# Patient Record
Sex: Male | Born: 1935 | Race: White | Hispanic: No | Smoking: Former smoker
Health system: Southern US, Community
[De-identification: ages and names within clinical notes are randomized; demographics above are authoritative.]

## PROBLEM LIST (undated history)

## (undated) DIAGNOSIS — K635 Polyp of colon: Secondary | ICD-10-CM

## (undated) DIAGNOSIS — C801 Malignant (primary) neoplasm, unspecified: Secondary | ICD-10-CM

## (undated) DIAGNOSIS — E785 Hyperlipidemia, unspecified: Secondary | ICD-10-CM

## (undated) DIAGNOSIS — R000 Tachycardia, unspecified: Secondary | ICD-10-CM

## (undated) DIAGNOSIS — J449 Chronic obstructive pulmonary disease, unspecified: Secondary | ICD-10-CM

## (undated) DIAGNOSIS — G309 Alzheimer's disease, unspecified: Secondary | ICD-10-CM

## (undated) DIAGNOSIS — I10 Essential (primary) hypertension: Secondary | ICD-10-CM

## (undated) DIAGNOSIS — F0280 Dementia in other diseases classified elsewhere without behavioral disturbance: Secondary | ICD-10-CM

## (undated) DIAGNOSIS — D689 Coagulation defect, unspecified: Secondary | ICD-10-CM

## (undated) HISTORY — DX: Essential (primary) hypertension: I10

## (undated) HISTORY — DX: Hyperlipidemia, unspecified: E78.5

## (undated) HISTORY — DX: Polyp of colon: K63.5

## (undated) HISTORY — DX: Alzheimer's disease, unspecified: G30.9

## (undated) HISTORY — DX: Dementia in other diseases classified elsewhere without behavioral disturbance: F02.80

## (undated) HISTORY — DX: Chronic obstructive pulmonary disease, unspecified: J44.9

## (undated) HISTORY — DX: Malignant (primary) neoplasm, unspecified: C80.1

## (undated) HISTORY — DX: Coagulation defect, unspecified: D68.9

---

## 2006-11-27 ENCOUNTER — Ambulatory Visit: Payer: Self-pay | Admitting: Gastroenterology

## 2006-12-04 ENCOUNTER — Ambulatory Visit: Payer: Self-pay | Admitting: Gastroenterology

## 2009-07-13 ENCOUNTER — Emergency Department: Payer: Self-pay | Admitting: Emergency Medicine

## 2009-10-27 ENCOUNTER — Ambulatory Visit: Payer: Self-pay | Admitting: Unknown Physician Specialty

## 2009-11-09 DIAGNOSIS — C4440 Unspecified malignant neoplasm of skin of scalp and neck: Secondary | ICD-10-CM | POA: Insufficient documentation

## 2009-12-07 HISTORY — PX: MOHS SURGERY: SHX181

## 2009-12-09 DIAGNOSIS — E785 Hyperlipidemia, unspecified: Secondary | ICD-10-CM | POA: Insufficient documentation

## 2009-12-09 DIAGNOSIS — F0280 Dementia in other diseases classified elsewhere without behavioral disturbance: Secondary | ICD-10-CM | POA: Insufficient documentation

## 2009-12-09 DIAGNOSIS — I10 Essential (primary) hypertension: Secondary | ICD-10-CM | POA: Insufficient documentation

## 2009-12-09 HISTORY — DX: Dementia in other diseases classified elsewhere, unspecified severity, without behavioral disturbance, psychotic disturbance, mood disturbance, and anxiety: F02.80

## 2011-08-25 DIAGNOSIS — H4010X0 Unspecified open-angle glaucoma, stage unspecified: Secondary | ICD-10-CM | POA: Diagnosis not present

## 2011-09-21 DIAGNOSIS — D696 Thrombocytopenia, unspecified: Secondary | ICD-10-CM | POA: Diagnosis not present

## 2011-09-21 DIAGNOSIS — I10 Essential (primary) hypertension: Secondary | ICD-10-CM | POA: Diagnosis not present

## 2011-09-21 DIAGNOSIS — F0280 Dementia in other diseases classified elsewhere without behavioral disturbance: Secondary | ICD-10-CM | POA: Diagnosis not present

## 2011-09-21 DIAGNOSIS — E785 Hyperlipidemia, unspecified: Secondary | ICD-10-CM | POA: Diagnosis not present

## 2011-09-22 DIAGNOSIS — F0280 Dementia in other diseases classified elsewhere without behavioral disturbance: Secondary | ICD-10-CM | POA: Diagnosis not present

## 2011-09-22 DIAGNOSIS — F068 Other specified mental disorders due to known physiological condition: Secondary | ICD-10-CM | POA: Diagnosis not present

## 2011-09-22 DIAGNOSIS — E785 Hyperlipidemia, unspecified: Secondary | ICD-10-CM | POA: Diagnosis not present

## 2011-09-22 DIAGNOSIS — G309 Alzheimer's disease, unspecified: Secondary | ICD-10-CM | POA: Diagnosis not present

## 2011-09-22 DIAGNOSIS — F0390 Unspecified dementia without behavioral disturbance: Secondary | ICD-10-CM | POA: Diagnosis not present

## 2011-09-22 DIAGNOSIS — D696 Thrombocytopenia, unspecified: Secondary | ICD-10-CM | POA: Diagnosis not present

## 2011-09-22 DIAGNOSIS — I10 Essential (primary) hypertension: Secondary | ICD-10-CM | POA: Diagnosis not present

## 2011-11-10 DIAGNOSIS — G309 Alzheimer's disease, unspecified: Secondary | ICD-10-CM | POA: Diagnosis not present

## 2011-12-23 DIAGNOSIS — B351 Tinea unguium: Secondary | ICD-10-CM | POA: Diagnosis not present

## 2011-12-23 DIAGNOSIS — M79609 Pain in unspecified limb: Secondary | ICD-10-CM | POA: Diagnosis not present

## 2012-01-03 DIAGNOSIS — Z85820 Personal history of malignant melanoma of skin: Secondary | ICD-10-CM | POA: Diagnosis not present

## 2012-01-03 DIAGNOSIS — D696 Thrombocytopenia, unspecified: Secondary | ICD-10-CM | POA: Diagnosis not present

## 2012-01-03 DIAGNOSIS — I10 Essential (primary) hypertension: Secondary | ICD-10-CM | POA: Diagnosis not present

## 2012-01-03 DIAGNOSIS — L989 Disorder of the skin and subcutaneous tissue, unspecified: Secondary | ICD-10-CM | POA: Diagnosis not present

## 2012-01-12 DIAGNOSIS — F0280 Dementia in other diseases classified elsewhere without behavioral disturbance: Secondary | ICD-10-CM | POA: Diagnosis not present

## 2012-01-27 DIAGNOSIS — R32 Unspecified urinary incontinence: Secondary | ICD-10-CM | POA: Diagnosis not present

## 2012-01-30 DIAGNOSIS — Z85820 Personal history of malignant melanoma of skin: Secondary | ICD-10-CM | POA: Diagnosis not present

## 2012-01-30 DIAGNOSIS — D696 Thrombocytopenia, unspecified: Secondary | ICD-10-CM | POA: Diagnosis not present

## 2012-01-30 DIAGNOSIS — G309 Alzheimer's disease, unspecified: Secondary | ICD-10-CM | POA: Diagnosis not present

## 2012-01-30 DIAGNOSIS — I10 Essential (primary) hypertension: Secondary | ICD-10-CM | POA: Diagnosis not present

## 2012-01-31 DIAGNOSIS — L905 Scar conditions and fibrosis of skin: Secondary | ICD-10-CM | POA: Diagnosis not present

## 2012-01-31 DIAGNOSIS — C44319 Basal cell carcinoma of skin of other parts of face: Secondary | ICD-10-CM | POA: Diagnosis not present

## 2012-01-31 DIAGNOSIS — L708 Other acne: Secondary | ICD-10-CM | POA: Diagnosis not present

## 2012-01-31 DIAGNOSIS — L570 Actinic keratosis: Secondary | ICD-10-CM | POA: Diagnosis not present

## 2012-02-01 DIAGNOSIS — F0280 Dementia in other diseases classified elsewhere without behavioral disturbance: Secondary | ICD-10-CM | POA: Diagnosis not present

## 2012-02-01 DIAGNOSIS — G309 Alzheimer's disease, unspecified: Secondary | ICD-10-CM | POA: Diagnosis not present

## 2012-02-01 DIAGNOSIS — F0281 Dementia in other diseases classified elsewhere with behavioral disturbance: Secondary | ICD-10-CM | POA: Diagnosis not present

## 2012-02-05 DIAGNOSIS — Z483 Aftercare following surgery for neoplasm: Secondary | ICD-10-CM | POA: Diagnosis not present

## 2012-02-05 DIAGNOSIS — F0390 Unspecified dementia without behavioral disturbance: Secondary | ICD-10-CM | POA: Diagnosis not present

## 2012-02-05 DIAGNOSIS — E785 Hyperlipidemia, unspecified: Secondary | ICD-10-CM | POA: Diagnosis not present

## 2012-02-05 DIAGNOSIS — J449 Chronic obstructive pulmonary disease, unspecified: Secondary | ICD-10-CM | POA: Diagnosis not present

## 2012-02-05 DIAGNOSIS — I10 Essential (primary) hypertension: Secondary | ICD-10-CM | POA: Diagnosis not present

## 2012-02-06 DIAGNOSIS — F0280 Dementia in other diseases classified elsewhere without behavioral disturbance: Secondary | ICD-10-CM | POA: Diagnosis not present

## 2012-02-06 DIAGNOSIS — I10 Essential (primary) hypertension: Secondary | ICD-10-CM | POA: Diagnosis not present

## 2012-02-06 DIAGNOSIS — D696 Thrombocytopenia, unspecified: Secondary | ICD-10-CM | POA: Diagnosis not present

## 2012-02-06 DIAGNOSIS — H409 Unspecified glaucoma: Secondary | ICD-10-CM | POA: Diagnosis not present

## 2012-02-08 DIAGNOSIS — Z483 Aftercare following surgery for neoplasm: Secondary | ICD-10-CM | POA: Diagnosis not present

## 2012-02-08 DIAGNOSIS — I10 Essential (primary) hypertension: Secondary | ICD-10-CM | POA: Diagnosis not present

## 2012-02-08 DIAGNOSIS — E785 Hyperlipidemia, unspecified: Secondary | ICD-10-CM | POA: Diagnosis not present

## 2012-02-08 DIAGNOSIS — F0390 Unspecified dementia without behavioral disturbance: Secondary | ICD-10-CM | POA: Diagnosis not present

## 2012-02-08 DIAGNOSIS — J449 Chronic obstructive pulmonary disease, unspecified: Secondary | ICD-10-CM | POA: Diagnosis not present

## 2012-02-09 DIAGNOSIS — Z483 Aftercare following surgery for neoplasm: Secondary | ICD-10-CM | POA: Diagnosis not present

## 2012-02-09 DIAGNOSIS — J449 Chronic obstructive pulmonary disease, unspecified: Secondary | ICD-10-CM | POA: Diagnosis not present

## 2012-02-09 DIAGNOSIS — E785 Hyperlipidemia, unspecified: Secondary | ICD-10-CM | POA: Diagnosis not present

## 2012-02-09 DIAGNOSIS — I10 Essential (primary) hypertension: Secondary | ICD-10-CM | POA: Diagnosis not present

## 2012-02-09 DIAGNOSIS — F0390 Unspecified dementia without behavioral disturbance: Secondary | ICD-10-CM | POA: Diagnosis not present

## 2012-02-10 ENCOUNTER — Ambulatory Visit: Payer: Self-pay | Admitting: Family Medicine

## 2012-02-10 DIAGNOSIS — I10 Essential (primary) hypertension: Secondary | ICD-10-CM | POA: Diagnosis not present

## 2012-02-10 DIAGNOSIS — R0989 Other specified symptoms and signs involving the circulatory and respiratory systems: Secondary | ICD-10-CM | POA: Diagnosis not present

## 2012-02-10 DIAGNOSIS — R0609 Other forms of dyspnea: Secondary | ICD-10-CM | POA: Diagnosis not present

## 2012-02-10 DIAGNOSIS — D696 Thrombocytopenia, unspecified: Secondary | ICD-10-CM | POA: Diagnosis not present

## 2012-02-10 DIAGNOSIS — H409 Unspecified glaucoma: Secondary | ICD-10-CM | POA: Diagnosis not present

## 2012-02-16 DIAGNOSIS — E785 Hyperlipidemia, unspecified: Secondary | ICD-10-CM | POA: Diagnosis not present

## 2012-02-16 DIAGNOSIS — J449 Chronic obstructive pulmonary disease, unspecified: Secondary | ICD-10-CM | POA: Diagnosis not present

## 2012-02-16 DIAGNOSIS — Z483 Aftercare following surgery for neoplasm: Secondary | ICD-10-CM | POA: Diagnosis not present

## 2012-02-16 DIAGNOSIS — I10 Essential (primary) hypertension: Secondary | ICD-10-CM | POA: Diagnosis not present

## 2012-02-16 DIAGNOSIS — F0390 Unspecified dementia without behavioral disturbance: Secondary | ICD-10-CM | POA: Diagnosis not present

## 2012-02-20 DIAGNOSIS — E785 Hyperlipidemia, unspecified: Secondary | ICD-10-CM | POA: Diagnosis not present

## 2012-02-20 DIAGNOSIS — F0280 Dementia in other diseases classified elsewhere without behavioral disturbance: Secondary | ICD-10-CM | POA: Diagnosis not present

## 2012-02-20 DIAGNOSIS — I10 Essential (primary) hypertension: Secondary | ICD-10-CM | POA: Diagnosis not present

## 2012-02-20 DIAGNOSIS — Z111 Encounter for screening for respiratory tuberculosis: Secondary | ICD-10-CM | POA: Diagnosis not present

## 2012-02-20 DIAGNOSIS — D696 Thrombocytopenia, unspecified: Secondary | ICD-10-CM | POA: Diagnosis not present

## 2012-02-20 DIAGNOSIS — G309 Alzheimer's disease, unspecified: Secondary | ICD-10-CM | POA: Diagnosis not present

## 2012-02-20 DIAGNOSIS — H4011X0 Primary open-angle glaucoma, stage unspecified: Secondary | ICD-10-CM | POA: Diagnosis not present

## 2012-02-22 DIAGNOSIS — Z483 Aftercare following surgery for neoplasm: Secondary | ICD-10-CM | POA: Diagnosis not present

## 2012-02-22 DIAGNOSIS — E785 Hyperlipidemia, unspecified: Secondary | ICD-10-CM | POA: Diagnosis not present

## 2012-02-22 DIAGNOSIS — J449 Chronic obstructive pulmonary disease, unspecified: Secondary | ICD-10-CM | POA: Diagnosis not present

## 2012-02-22 DIAGNOSIS — F0390 Unspecified dementia without behavioral disturbance: Secondary | ICD-10-CM | POA: Diagnosis not present

## 2012-02-22 DIAGNOSIS — I10 Essential (primary) hypertension: Secondary | ICD-10-CM | POA: Diagnosis not present

## 2012-03-05 DIAGNOSIS — L570 Actinic keratosis: Secondary | ICD-10-CM | POA: Diagnosis not present

## 2012-03-05 DIAGNOSIS — L821 Other seborrheic keratosis: Secondary | ICD-10-CM | POA: Diagnosis not present

## 2012-03-05 DIAGNOSIS — C44319 Basal cell carcinoma of skin of other parts of face: Secondary | ICD-10-CM | POA: Diagnosis not present

## 2012-04-02 DIAGNOSIS — C44319 Basal cell carcinoma of skin of other parts of face: Secondary | ICD-10-CM | POA: Diagnosis not present

## 2013-01-25 DIAGNOSIS — F0280 Dementia in other diseases classified elsewhere without behavioral disturbance: Secondary | ICD-10-CM | POA: Diagnosis not present

## 2013-02-11 DIAGNOSIS — I10 Essential (primary) hypertension: Secondary | ICD-10-CM | POA: Diagnosis not present

## 2013-02-11 DIAGNOSIS — E785 Hyperlipidemia, unspecified: Secondary | ICD-10-CM | POA: Diagnosis not present

## 2013-02-11 DIAGNOSIS — D696 Thrombocytopenia, unspecified: Secondary | ICD-10-CM | POA: Diagnosis not present

## 2013-02-11 LAB — CBC AND DIFFERENTIAL
HEMATOCRIT: 44 % (ref 41–53)
Hemoglobin: 15.1 g/dL (ref 13.5–17.5)
Platelets: 126 10*3/uL — AB (ref 150–399)
WBC: 7.3 10^3/mL

## 2013-02-12 DIAGNOSIS — D696 Thrombocytopenia, unspecified: Secondary | ICD-10-CM | POA: Diagnosis not present

## 2013-02-12 DIAGNOSIS — E785 Hyperlipidemia, unspecified: Secondary | ICD-10-CM | POA: Diagnosis not present

## 2013-02-12 DIAGNOSIS — Z0000 Encounter for general adult medical examination without abnormal findings: Secondary | ICD-10-CM | POA: Diagnosis not present

## 2013-02-12 DIAGNOSIS — Z23 Encounter for immunization: Secondary | ICD-10-CM | POA: Diagnosis not present

## 2013-02-12 DIAGNOSIS — F0280 Dementia in other diseases classified elsewhere without behavioral disturbance: Secondary | ICD-10-CM | POA: Diagnosis not present

## 2013-02-22 DIAGNOSIS — Z23 Encounter for immunization: Secondary | ICD-10-CM | POA: Diagnosis not present

## 2013-03-11 DIAGNOSIS — Z111 Encounter for screening for respiratory tuberculosis: Secondary | ICD-10-CM | POA: Diagnosis not present

## 2013-04-17 DIAGNOSIS — J019 Acute sinusitis, unspecified: Secondary | ICD-10-CM | POA: Diagnosis not present

## 2013-04-17 DIAGNOSIS — D696 Thrombocytopenia, unspecified: Secondary | ICD-10-CM | POA: Diagnosis not present

## 2013-04-17 DIAGNOSIS — F0280 Dementia in other diseases classified elsewhere without behavioral disturbance: Secondary | ICD-10-CM | POA: Diagnosis not present

## 2013-04-17 DIAGNOSIS — Z23 Encounter for immunization: Secondary | ICD-10-CM | POA: Diagnosis not present

## 2013-04-17 DIAGNOSIS — J449 Chronic obstructive pulmonary disease, unspecified: Secondary | ICD-10-CM | POA: Diagnosis not present

## 2013-07-12 ENCOUNTER — Inpatient Hospital Stay: Payer: Self-pay | Admitting: Internal Medicine

## 2013-07-12 DIAGNOSIS — J40 Bronchitis, not specified as acute or chronic: Secondary | ICD-10-CM | POA: Diagnosis not present

## 2013-07-12 DIAGNOSIS — G9340 Encephalopathy, unspecified: Secondary | ICD-10-CM | POA: Diagnosis present

## 2013-07-12 DIAGNOSIS — J449 Chronic obstructive pulmonary disease, unspecified: Secondary | ICD-10-CM | POA: Diagnosis not present

## 2013-07-12 DIAGNOSIS — I10 Essential (primary) hypertension: Secondary | ICD-10-CM | POA: Diagnosis present

## 2013-07-12 DIAGNOSIS — I959 Hypotension, unspecified: Secondary | ICD-10-CM | POA: Diagnosis not present

## 2013-07-12 DIAGNOSIS — H409 Unspecified glaucoma: Secondary | ICD-10-CM | POA: Diagnosis not present

## 2013-07-12 DIAGNOSIS — J9600 Acute respiratory failure, unspecified whether with hypoxia or hypercapnia: Secondary | ICD-10-CM | POA: Diagnosis present

## 2013-07-12 DIAGNOSIS — Z87891 Personal history of nicotine dependence: Secondary | ICD-10-CM | POA: Diagnosis not present

## 2013-07-12 DIAGNOSIS — J9819 Other pulmonary collapse: Secondary | ICD-10-CM | POA: Diagnosis not present

## 2013-07-12 DIAGNOSIS — J441 Chronic obstructive pulmonary disease with (acute) exacerbation: Secondary | ICD-10-CM | POA: Diagnosis not present

## 2013-07-12 DIAGNOSIS — G309 Alzheimer's disease, unspecified: Secondary | ICD-10-CM | POA: Diagnosis present

## 2013-07-12 DIAGNOSIS — Z85820 Personal history of malignant melanoma of skin: Secondary | ICD-10-CM | POA: Diagnosis not present

## 2013-07-12 DIAGNOSIS — E785 Hyperlipidemia, unspecified: Secondary | ICD-10-CM | POA: Diagnosis not present

## 2013-07-12 DIAGNOSIS — A419 Sepsis, unspecified organism: Secondary | ICD-10-CM | POA: Diagnosis present

## 2013-07-12 DIAGNOSIS — J189 Pneumonia, unspecified organism: Secondary | ICD-10-CM | POA: Diagnosis present

## 2013-07-12 DIAGNOSIS — F0280 Dementia in other diseases classified elsewhere without behavioral disturbance: Secondary | ICD-10-CM | POA: Diagnosis present

## 2013-07-12 DIAGNOSIS — I359 Nonrheumatic aortic valve disorder, unspecified: Secondary | ICD-10-CM | POA: Diagnosis not present

## 2013-07-12 DIAGNOSIS — F02818 Dementia in other diseases classified elsewhere, unspecified severity, with other behavioral disturbance: Secondary | ICD-10-CM | POA: Diagnosis present

## 2013-07-12 DIAGNOSIS — Z7982 Long term (current) use of aspirin: Secondary | ICD-10-CM | POA: Diagnosis not present

## 2013-07-12 DIAGNOSIS — F0281 Dementia in other diseases classified elsewhere with behavioral disturbance: Secondary | ICD-10-CM | POA: Diagnosis present

## 2013-07-12 DIAGNOSIS — R0602 Shortness of breath: Secondary | ICD-10-CM | POA: Diagnosis not present

## 2013-07-12 LAB — BASIC METABOLIC PANEL
Anion Gap: 8 (ref 7–16)
BUN: 17 mg/dL (ref 7–18)
CALCIUM: 8.7 mg/dL (ref 8.5–10.1)
CHLORIDE: 106 mmol/L (ref 98–107)
CO2: 25 mmol/L (ref 21–32)
CREATININE: 1.07 mg/dL (ref 0.60–1.30)
EGFR (Non-African Amer.): >60
Glucose: 179 mg/dL — ABNORMAL HIGH (ref 65–99)
OSMOLALITY: 284 (ref 275–301)
Potassium: 3.6 mmol/L (ref 3.5–5.1)
Sodium: 139 mmol/L (ref 136–145)

## 2013-07-12 LAB — CBC
HCT: 47.6 % (ref 40.0–52.0)
HGB: 15.4 g/dL (ref 13.0–18.0)
MCH: 31.3 pg (ref 26.0–34.0)
MCHC: 32.4 g/dL (ref 32.0–36.0)
MCV: 97 fL (ref 80–100)
PLATELETS: 112 10*3/uL — AB (ref 150–440)
RBC: 4.93 10*6/uL (ref 4.40–5.90)
RDW: 14.3 % (ref 11.5–14.5)
WBC: 13.7 10*3/uL — AB (ref 3.8–10.6)

## 2013-07-12 LAB — PRO B NATRIURETIC PEPTIDE: B-Type Natriuretic Peptide: 174 pg/mL (ref 0–450)

## 2013-07-12 LAB — CK TOTAL AND CKMB (NOT AT ARMC)
CK, Total: 102 U/L
CK-MB: 2.4 ng/mL (ref 0.5–3.6)

## 2013-07-12 LAB — TROPONIN I: Troponin-I: 0.03 ng/mL

## 2013-07-13 DIAGNOSIS — J40 Bronchitis, not specified as acute or chronic: Secondary | ICD-10-CM | POA: Diagnosis not present

## 2013-07-13 DIAGNOSIS — J9819 Other pulmonary collapse: Secondary | ICD-10-CM | POA: Diagnosis not present

## 2013-07-13 DIAGNOSIS — E785 Hyperlipidemia, unspecified: Secondary | ICD-10-CM | POA: Diagnosis not present

## 2013-07-13 DIAGNOSIS — J9600 Acute respiratory failure, unspecified whether with hypoxia or hypercapnia: Secondary | ICD-10-CM | POA: Diagnosis not present

## 2013-07-13 DIAGNOSIS — I959 Hypotension, unspecified: Secondary | ICD-10-CM | POA: Diagnosis not present

## 2013-07-13 DIAGNOSIS — H409 Unspecified glaucoma: Secondary | ICD-10-CM | POA: Diagnosis not present

## 2013-07-13 DIAGNOSIS — J441 Chronic obstructive pulmonary disease with (acute) exacerbation: Secondary | ICD-10-CM | POA: Diagnosis not present

## 2013-07-13 LAB — BASIC METABOLIC PANEL
Anion Gap: 8 (ref 7–16)
BUN: 16 mg/dL (ref 7–18)
CHLORIDE: 107 mmol/L (ref 98–107)
CREATININE: 1.16 mg/dL (ref 0.60–1.30)
Calcium, Total: 8.5 mg/dL (ref 8.5–10.1)
Co2: 23 mmol/L (ref 21–32)
EGFR (African American): >60
EGFR (Non-African Amer.): 58 — ABNORMAL LOW
GLUCOSE: 225 mg/dL — AB (ref 65–99)
Osmolality: 284 (ref 275–301)
Potassium: 4.1 mmol/L (ref 3.5–5.1)
Sodium: 138 mmol/L (ref 136–145)

## 2013-07-13 LAB — CBC WITH DIFFERENTIAL/PLATELET
BASOS PCT: 0.1 %
Basophil #: 0 10*3/uL (ref 0.0–0.1)
Eosinophil #: 0 10*3/uL (ref 0.0–0.7)
Eosinophil %: 0 %
HCT: 42.5 % (ref 40.0–52.0)
HGB: 14 g/dL (ref 13.0–18.0)
Lymphocyte #: 0.7 10*3/uL — ABNORMAL LOW (ref 1.0–3.6)
Lymphocyte %: 4 %
MCH: 31.5 pg (ref 26.0–34.0)
MCHC: 32.9 g/dL (ref 32.0–36.0)
MCV: 96 fL (ref 80–100)
MONOS PCT: 3.8 %
Monocyte #: 0.7 x10 3/mm (ref 0.2–1.0)
Neutrophil #: 16.4 10*3/uL — ABNORMAL HIGH (ref 1.4–6.5)
Neutrophil %: 92.1 %
PLATELETS: 94 10*3/uL — AB (ref 150–440)
RBC: 4.43 10*6/uL (ref 4.40–5.90)
RDW: 14.1 % (ref 11.5–14.5)
WBC: 17.8 10*3/uL — ABNORMAL HIGH (ref 3.8–10.6)

## 2013-07-14 DIAGNOSIS — J9600 Acute respiratory failure, unspecified whether with hypoxia or hypercapnia: Secondary | ICD-10-CM | POA: Diagnosis not present

## 2013-07-14 DIAGNOSIS — H409 Unspecified glaucoma: Secondary | ICD-10-CM | POA: Diagnosis not present

## 2013-07-14 DIAGNOSIS — E785 Hyperlipidemia, unspecified: Secondary | ICD-10-CM | POA: Diagnosis not present

## 2013-07-14 DIAGNOSIS — J441 Chronic obstructive pulmonary disease with (acute) exacerbation: Secondary | ICD-10-CM | POA: Diagnosis not present

## 2013-07-14 DIAGNOSIS — I959 Hypotension, unspecified: Secondary | ICD-10-CM | POA: Diagnosis not present

## 2013-07-14 LAB — CBC WITH DIFFERENTIAL/PLATELET
BASOS ABS: 0 10*3/uL (ref 0.0–0.1)
BASOS PCT: 0 %
Eosinophil #: 0 10*3/uL (ref 0.0–0.7)
Eosinophil %: 0 %
HCT: 39 % — ABNORMAL LOW (ref 40.0–52.0)
HGB: 12.7 g/dL — ABNORMAL LOW (ref 13.0–18.0)
LYMPHS ABS: 1 10*3/uL (ref 1.0–3.6)
LYMPHS PCT: 6.1 %
MCH: 31 pg (ref 26.0–34.0)
MCHC: 32.4 g/dL (ref 32.0–36.0)
MCV: 96 fL (ref 80–100)
MONO ABS: 0.9 x10 3/mm (ref 0.2–1.0)
MONOS PCT: 5.5 %
NEUTROS PCT: 88.4 %
Neutrophil #: 13.8 10*3/uL — ABNORMAL HIGH (ref 1.4–6.5)
PLATELETS: 100 10*3/uL — AB (ref 150–440)
RBC: 4.09 10*6/uL — AB (ref 4.40–5.90)
RDW: 14.1 % (ref 11.5–14.5)
WBC: 15.6 10*3/uL — ABNORMAL HIGH (ref 3.8–10.6)

## 2013-07-15 DIAGNOSIS — J9600 Acute respiratory failure, unspecified whether with hypoxia or hypercapnia: Secondary | ICD-10-CM | POA: Diagnosis not present

## 2013-07-15 DIAGNOSIS — I359 Nonrheumatic aortic valve disorder, unspecified: Secondary | ICD-10-CM | POA: Diagnosis not present

## 2013-07-15 DIAGNOSIS — I959 Hypotension, unspecified: Secondary | ICD-10-CM | POA: Diagnosis not present

## 2013-07-15 DIAGNOSIS — J441 Chronic obstructive pulmonary disease with (acute) exacerbation: Secondary | ICD-10-CM | POA: Diagnosis not present

## 2013-07-15 DIAGNOSIS — E785 Hyperlipidemia, unspecified: Secondary | ICD-10-CM | POA: Diagnosis not present

## 2013-07-15 DIAGNOSIS — H409 Unspecified glaucoma: Secondary | ICD-10-CM | POA: Diagnosis not present

## 2013-07-16 DIAGNOSIS — H409 Unspecified glaucoma: Secondary | ICD-10-CM | POA: Diagnosis not present

## 2013-07-16 DIAGNOSIS — E785 Hyperlipidemia, unspecified: Secondary | ICD-10-CM | POA: Diagnosis not present

## 2013-07-16 DIAGNOSIS — J9600 Acute respiratory failure, unspecified whether with hypoxia or hypercapnia: Secondary | ICD-10-CM | POA: Diagnosis not present

## 2013-07-16 DIAGNOSIS — J441 Chronic obstructive pulmonary disease with (acute) exacerbation: Secondary | ICD-10-CM | POA: Diagnosis not present

## 2013-07-16 DIAGNOSIS — I959 Hypotension, unspecified: Secondary | ICD-10-CM | POA: Diagnosis not present

## 2013-07-17 LAB — CULTURE, BLOOD (SINGLE)

## 2013-07-31 DIAGNOSIS — G309 Alzheimer's disease, unspecified: Secondary | ICD-10-CM | POA: Diagnosis not present

## 2013-07-31 DIAGNOSIS — Z23 Encounter for immunization: Secondary | ICD-10-CM | POA: Diagnosis not present

## 2013-07-31 DIAGNOSIS — D696 Thrombocytopenia, unspecified: Secondary | ICD-10-CM | POA: Diagnosis not present

## 2013-07-31 DIAGNOSIS — J441 Chronic obstructive pulmonary disease with (acute) exacerbation: Secondary | ICD-10-CM | POA: Diagnosis not present

## 2013-07-31 DIAGNOSIS — J449 Chronic obstructive pulmonary disease, unspecified: Secondary | ICD-10-CM | POA: Diagnosis not present

## 2013-07-31 DIAGNOSIS — F0280 Dementia in other diseases classified elsewhere without behavioral disturbance: Secondary | ICD-10-CM | POA: Diagnosis not present

## 2013-08-07 DIAGNOSIS — R262 Difficulty in walking, not elsewhere classified: Secondary | ICD-10-CM | POA: Diagnosis not present

## 2013-08-07 DIAGNOSIS — R0602 Shortness of breath: Secondary | ICD-10-CM | POA: Diagnosis not present

## 2013-08-07 DIAGNOSIS — R279 Unspecified lack of coordination: Secondary | ICD-10-CM | POA: Diagnosis not present

## 2013-08-07 DIAGNOSIS — R069 Unspecified abnormalities of breathing: Secondary | ICD-10-CM | POA: Diagnosis not present

## 2013-08-12 DIAGNOSIS — R069 Unspecified abnormalities of breathing: Secondary | ICD-10-CM | POA: Diagnosis not present

## 2013-08-12 DIAGNOSIS — R0602 Shortness of breath: Secondary | ICD-10-CM | POA: Diagnosis not present

## 2013-08-14 DIAGNOSIS — R279 Unspecified lack of coordination: Secondary | ICD-10-CM | POA: Diagnosis not present

## 2013-08-14 DIAGNOSIS — R0602 Shortness of breath: Secondary | ICD-10-CM | POA: Diagnosis not present

## 2013-08-14 DIAGNOSIS — R069 Unspecified abnormalities of breathing: Secondary | ICD-10-CM | POA: Diagnosis not present

## 2013-08-14 DIAGNOSIS — R262 Difficulty in walking, not elsewhere classified: Secondary | ICD-10-CM | POA: Diagnosis not present

## 2013-10-01 DIAGNOSIS — Z23 Encounter for immunization: Secondary | ICD-10-CM | POA: Diagnosis not present

## 2013-10-01 DIAGNOSIS — D696 Thrombocytopenia, unspecified: Secondary | ICD-10-CM | POA: Diagnosis not present

## 2013-10-01 DIAGNOSIS — B356 Tinea cruris: Secondary | ICD-10-CM | POA: Diagnosis not present

## 2013-10-01 DIAGNOSIS — G309 Alzheimer's disease, unspecified: Secondary | ICD-10-CM | POA: Diagnosis not present

## 2013-10-01 DIAGNOSIS — F0280 Dementia in other diseases classified elsewhere without behavioral disturbance: Secondary | ICD-10-CM | POA: Diagnosis not present

## 2013-10-01 DIAGNOSIS — L0291 Cutaneous abscess, unspecified: Secondary | ICD-10-CM | POA: Diagnosis not present

## 2013-10-01 DIAGNOSIS — L0390 Cellulitis, unspecified: Secondary | ICD-10-CM | POA: Diagnosis not present

## 2013-10-16 DIAGNOSIS — F0280 Dementia in other diseases classified elsewhere without behavioral disturbance: Secondary | ICD-10-CM | POA: Diagnosis not present

## 2013-10-16 DIAGNOSIS — Z23 Encounter for immunization: Secondary | ICD-10-CM | POA: Diagnosis not present

## 2013-10-16 DIAGNOSIS — L0291 Cutaneous abscess, unspecified: Secondary | ICD-10-CM | POA: Diagnosis not present

## 2013-10-16 DIAGNOSIS — L0390 Cellulitis, unspecified: Secondary | ICD-10-CM | POA: Diagnosis not present

## 2013-10-16 DIAGNOSIS — G309 Alzheimer's disease, unspecified: Secondary | ICD-10-CM | POA: Diagnosis not present

## 2013-10-16 DIAGNOSIS — B356 Tinea cruris: Secondary | ICD-10-CM | POA: Diagnosis not present

## 2013-10-16 DIAGNOSIS — D696 Thrombocytopenia, unspecified: Secondary | ICD-10-CM | POA: Diagnosis not present

## 2013-12-23 ENCOUNTER — Inpatient Hospital Stay: Payer: Self-pay | Admitting: Internal Medicine

## 2013-12-23 DIAGNOSIS — H409 Unspecified glaucoma: Secondary | ICD-10-CM | POA: Diagnosis present

## 2013-12-23 DIAGNOSIS — G309 Alzheimer's disease, unspecified: Secondary | ICD-10-CM | POA: Diagnosis present

## 2013-12-23 DIAGNOSIS — Z66 Do not resuscitate: Secondary | ICD-10-CM | POA: Diagnosis present

## 2013-12-23 DIAGNOSIS — Z7982 Long term (current) use of aspirin: Secondary | ICD-10-CM | POA: Diagnosis not present

## 2013-12-23 DIAGNOSIS — B9689 Other specified bacterial agents as the cause of diseases classified elsewhere: Secondary | ICD-10-CM | POA: Diagnosis present

## 2013-12-23 DIAGNOSIS — D72829 Elevated white blood cell count, unspecified: Secondary | ICD-10-CM | POA: Diagnosis not present

## 2013-12-23 DIAGNOSIS — J440 Chronic obstructive pulmonary disease with acute lower respiratory infection: Secondary | ICD-10-CM | POA: Diagnosis present

## 2013-12-23 DIAGNOSIS — J40 Bronchitis, not specified as acute or chronic: Secondary | ICD-10-CM | POA: Diagnosis not present

## 2013-12-23 DIAGNOSIS — A499 Bacterial infection, unspecified: Secondary | ICD-10-CM | POA: Diagnosis present

## 2013-12-23 DIAGNOSIS — Z87891 Personal history of nicotine dependence: Secondary | ICD-10-CM | POA: Diagnosis not present

## 2013-12-23 DIAGNOSIS — E785 Hyperlipidemia, unspecified: Secondary | ICD-10-CM | POA: Diagnosis present

## 2013-12-23 DIAGNOSIS — F0280 Dementia in other diseases classified elsewhere without behavioral disturbance: Secondary | ICD-10-CM | POA: Diagnosis not present

## 2013-12-23 DIAGNOSIS — J189 Pneumonia, unspecified organism: Secondary | ICD-10-CM | POA: Diagnosis not present

## 2013-12-23 DIAGNOSIS — R509 Fever, unspecified: Secondary | ICD-10-CM | POA: Diagnosis not present

## 2013-12-23 DIAGNOSIS — R0902 Hypoxemia: Secondary | ICD-10-CM | POA: Diagnosis not present

## 2013-12-23 DIAGNOSIS — F0390 Unspecified dementia without behavioral disturbance: Secondary | ICD-10-CM | POA: Diagnosis not present

## 2013-12-23 DIAGNOSIS — Z85820 Personal history of malignant melanoma of skin: Secondary | ICD-10-CM | POA: Diagnosis not present

## 2013-12-23 DIAGNOSIS — I10 Essential (primary) hypertension: Secondary | ICD-10-CM | POA: Diagnosis present

## 2013-12-23 DIAGNOSIS — A419 Sepsis, unspecified organism: Secondary | ICD-10-CM | POA: Diagnosis not present

## 2013-12-23 LAB — CBC WITH DIFFERENTIAL/PLATELET
BASOS PCT: 0.2 %
Basophil #: 0 10*3/uL (ref 0.0–0.1)
Eosinophil #: 0.1 10*3/uL (ref 0.0–0.7)
Eosinophil %: 0.2 %
HCT: 42.8 % (ref 40.0–52.0)
HGB: 13.9 g/dL (ref 13.0–18.0)
Lymphocyte #: 1.2 10*3/uL (ref 1.0–3.6)
Lymphocyte %: 5.5 %
MCH: 30.8 pg (ref 26.0–34.0)
MCHC: 32.5 g/dL (ref 32.0–36.0)
MCV: 95 fL (ref 80–100)
MONOS PCT: 6.5 %
Monocyte #: 1.4 x10 3/mm — ABNORMAL HIGH (ref 0.2–1.0)
NEUTROS PCT: 87.6 %
Neutrophil #: 19 10*3/uL — ABNORMAL HIGH (ref 1.4–6.5)
Platelet: 165 10*3/uL (ref 150–440)
RBC: 4.52 10*6/uL (ref 4.40–5.90)
RDW: 14 % (ref 11.5–14.5)
WBC: 21.7 10*3/uL — AB (ref 3.8–10.6)

## 2013-12-23 LAB — URINALYSIS, COMPLETE
BILIRUBIN, UR: NEGATIVE
Bacteria: NONE SEEN
Glucose,UR: NEGATIVE mg/dL (ref 0–75)
Hyaline Cast: 3
KETONE: NEGATIVE
LEUKOCYTE ESTERASE: NEGATIVE
Nitrite: NEGATIVE
Ph: 5 (ref 4.5–8.0)
SQUAMOUS EPITHELIAL: NONE SEEN
Specific Gravity: 1.028 (ref 1.003–1.030)
WBC UR: NONE SEEN /HPF (ref 0–5)

## 2013-12-23 LAB — COMPREHENSIVE METABOLIC PANEL
AST: 58 U/L — AB (ref 15–37)
Albumin: 2.9 g/dL — ABNORMAL LOW (ref 3.4–5.0)
Alkaline Phosphatase: 142 U/L — ABNORMAL HIGH
Anion Gap: 9 (ref 7–16)
BUN: 17 mg/dL (ref 7–18)
Bilirubin,Total: 0.6 mg/dL (ref 0.2–1.0)
CHLORIDE: 106 mmol/L (ref 98–107)
Calcium, Total: 8.7 mg/dL (ref 8.5–10.1)
Co2: 25 mmol/L (ref 21–32)
Creatinine: 0.95 mg/dL (ref 0.60–1.30)
EGFR (African American): >60
EGFR (Non-African Amer.): >60
GLUCOSE: 145 mg/dL — AB (ref 65–99)
OSMOLALITY: 284 (ref 275–301)
Potassium: 4.6 mmol/L (ref 3.5–5.1)
SGPT (ALT): 47 U/L
SODIUM: 140 mmol/L (ref 136–145)
Total Protein: 7.6 g/dL (ref 6.4–8.2)

## 2013-12-23 LAB — PROTIME-INR
INR: 0.9
PROTHROMBIN TIME: 12.5 s (ref 11.5–14.7)

## 2013-12-23 LAB — MAGNESIUM: MAGNESIUM: 2.1 mg/dL

## 2013-12-23 LAB — TROPONIN I: Troponin-I: <0.02 ng/mL

## 2013-12-23 LAB — PHOSPHORUS: Phosphorus: 1.4 mg/dL — ABNORMAL LOW (ref 2.5–4.9)

## 2013-12-24 LAB — CBC WITH DIFFERENTIAL/PLATELET
Basophil #: 0 10*3/uL (ref 0.0–0.1)
Basophil %: 0.1 %
EOS ABS: 0 10*3/uL (ref 0.0–0.7)
Eosinophil %: 0 %
HCT: 34.4 % — ABNORMAL LOW (ref 40.0–52.0)
HGB: 11.1 g/dL — ABNORMAL LOW (ref 13.0–18.0)
LYMPHS ABS: 0.6 10*3/uL — AB (ref 1.0–3.6)
LYMPHS PCT: 3.1 %
MCH: 30.5 pg (ref 26.0–34.0)
MCHC: 32.2 g/dL (ref 32.0–36.0)
MCV: 95 fL (ref 80–100)
MONOS PCT: 2.3 %
Monocyte #: 0.4 x10 3/mm (ref 0.2–1.0)
NEUTROS ABS: 17 10*3/uL — AB (ref 1.4–6.5)
NEUTROS PCT: 94.5 %
Platelet: 135 10*3/uL — ABNORMAL LOW (ref 150–440)
RBC: 3.63 10*6/uL — ABNORMAL LOW (ref 4.40–5.90)
RDW: 14 % (ref 11.5–14.5)
WBC: 18 10*3/uL — ABNORMAL HIGH (ref 3.8–10.6)

## 2013-12-24 LAB — BASIC METABOLIC PANEL
ANION GAP: 8 (ref 7–16)
BUN: 12 mg/dL (ref 7–18)
CALCIUM: 7.7 mg/dL — AB (ref 8.5–10.1)
Chloride: 112 mmol/L — ABNORMAL HIGH (ref 98–107)
Co2: 23 mmol/L (ref 21–32)
Creatinine: 0.9 mg/dL (ref 0.60–1.30)
EGFR (African American): >60
EGFR (Non-African Amer.): >60
GLUCOSE: 216 mg/dL — AB (ref 65–99)
Osmolality: 291 (ref 275–301)
POTASSIUM: 4.1 mmol/L (ref 3.5–5.1)
Sodium: 143 mmol/L (ref 136–145)

## 2013-12-24 LAB — RAPID INFLUENZA A&B ANTIGENS (ARMC ONLY)

## 2013-12-25 LAB — WBC: WBC: 15.6 10*3/uL — ABNORMAL HIGH (ref 3.8–10.6)

## 2013-12-25 LAB — PLATELET COUNT: PLATELETS: 148 10*3/uL — AB (ref 150–440)

## 2013-12-25 LAB — URINE CULTURE

## 2013-12-28 LAB — CULTURE, BLOOD (SINGLE)

## 2014-01-15 DIAGNOSIS — J209 Acute bronchitis, unspecified: Secondary | ICD-10-CM | POA: Diagnosis not present

## 2014-01-15 DIAGNOSIS — Z23 Encounter for immunization: Secondary | ICD-10-CM | POA: Diagnosis not present

## 2014-02-07 DIAGNOSIS — G309 Alzheimer's disease, unspecified: Secondary | ICD-10-CM | POA: Diagnosis not present

## 2014-02-07 DIAGNOSIS — F0280 Dementia in other diseases classified elsewhere without behavioral disturbance: Secondary | ICD-10-CM | POA: Diagnosis not present

## 2014-02-07 DIAGNOSIS — M799 Soft tissue disorder, unspecified: Secondary | ICD-10-CM | POA: Diagnosis not present

## 2014-02-07 DIAGNOSIS — F02818 Dementia in other diseases classified elsewhere, unspecified severity, with other behavioral disturbance: Secondary | ICD-10-CM | POA: Diagnosis not present

## 2014-02-07 DIAGNOSIS — F0281 Dementia in other diseases classified elsewhere with behavioral disturbance: Secondary | ICD-10-CM | POA: Diagnosis not present

## 2014-05-30 DIAGNOSIS — I10 Essential (primary) hypertension: Secondary | ICD-10-CM | POA: Diagnosis not present

## 2014-05-30 DIAGNOSIS — E785 Hyperlipidemia, unspecified: Secondary | ICD-10-CM | POA: Diagnosis not present

## 2014-05-30 LAB — BASIC METABOLIC PANEL
BUN: 15 mg/dL (ref 4–21)
Creatinine: 0.7 mg/dL (ref 0.6–1.3)
GLUCOSE: 114 mg/dL
POTASSIUM: 4.1 mmol/L (ref 3.4–5.3)
SODIUM: 143 mmol/L (ref 137–147)

## 2014-05-30 LAB — HEPATIC FUNCTION PANEL
ALT: 12 U/L (ref 10–40)
AST: 13 U/L — AB (ref 14–40)

## 2014-05-30 LAB — LIPID PANEL
Cholesterol: 157 mg/dL (ref 0–200)
HDL: 48 mg/dL (ref 35–70)
LDL Cholesterol: 92 mg/dL
Triglycerides: 87 mg/dL (ref 40–160)

## 2014-07-08 DIAGNOSIS — Z0000 Encounter for general adult medical examination without abnormal findings: Secondary | ICD-10-CM | POA: Diagnosis not present

## 2014-09-06 NOTE — H&P (Signed)
PATIENT NAME:  Adam Singleton, Adam Singleton MR#:  [id number] DATE OF BIRTH:  [birth year]  DATE OF ADMISSION:  07/12/2013  PRIMARY CARE PHYSICIAN: Dr.  Lelon Huh.   REFERRING PHYSICIAN:  Dr. Lennette Bihari A. Paduchowski.   CHIEF COMPLAINT: Shortness of breath.   HISTORY OF PRESENT ILLNESS: This is an 81 year old male with past medical history of Alzheimer's dementia, hyperlipidemia, hypertension, glaucoma, history of malignant melanoma, COPD, who lives in [facility] home for last one year because of Alzheimer's. Today, they called his son about him having COPD exacerbation, severe shortness of breath, cough and fever, and called the EMS to send him to Emergency Room. History obtained from patient's son who is present currently in the room. His name is Mr. Adam Singleton, Brooke Bonito., who is also healthcare power of attorney for the patient. As per him, he got a call from the nursing home that he is short of breath for one day now and not getting better, so they decided to send him to Emergency Room. He denies any further history other than fever and cough. At patient's baseline, he does not use any oxygen. He has some inhalers, and the patient is able to walk without any support at baseline.   REVIEW OF SYSTEMS: Unable to get, as the patient is not able to give me any history.   HISTORY OF PRESENTING ILLNESS: Further finding;  in ER, the patient was found to be breathing at rate of 35 to 40, and so he was started on BiPAP with treatment of COPD and hospitalist service was contacted for further management.   PAST MEDICAL HISTORY:  1.  Hypertension.  2.  Hyperlipidemia.  3.  COPD.   4.  Glaucoma. 5.  Alzheimer's dementia. 6.  Malignant melanoma.   SOCIAL HISTORY: Lives in a nursing home, because of Alzheimer's, for the last one year. He was a smoker, but quit smoking a long time ago in 1990s.  No alcohol or illegal drug use.   FAMILY HISTORY: Positive for cancer of bone in his sister.   MEDICATIONS: At nursing  home currently; 1.  Aspirin 81 mg daily.  2.  Lisinopril 40 mg daily.  3.  Namenda extended-release 28 mg capsule once a day.  4.  Symbicort 2 puffs inhalation twice daily.  5.  Lovastatin 20 mg daily.  6.  Travatan 0.004% eyedrops at bedtime, both eyes.  7.  Aricept 10 mg oral tablet daily.  8.  ProAir inhalation 2 puffs inhaled every 6 hours as needed.  PHYSICAL EXAMINATION: VITAL SIGNS: In the ER, temperature 99.5, pulse rate 131, respiratory rate was 35, blood pressure 167/70 and pulse oximetry is 90 on room air; he is now 99% on BiPAP.  GENERAL: The patient appeared to be acutely distressed because of respiratory failure, using BiPAP and breathing at almost 35 to 40 constantly.  HEENT: Head and neck atraumatic. Conjunctivae pink. Oral mucosa moist.  NECK: Supple. No JVD.  RESPIRATORY: Bilateral wheezing and crackles present.  CARDIOVASCULAR: S1, S2 present. Tachycardia, no murmurs appreciated.  ABDOMEN:  Obese feels a little tight, bowel sounds present. No organomegaly felt.  SKIN: No rashes.  EXTREMITIES: Legs: No edema.  NEUROLOGICAL: He is able to move all four limbs. Power 4/5. No tremors.   LABORATORY AND RADIOLOGY: WBC count 13.7, hemoglobin 15.4, platelet count 112, MCV is 97. Troponin is 0.03, creatinine 1.07, sodium level 139, potassium 3.6, chloride is 106, and CO2 25, BNP is 174.  On ABG:  pH is 7.4, pCO2 31  and pO2 is 171, FiO2 is 50% on BiPAP. Chest x-ray, portable, single view, showed no acute infiltrate or pulmonary edema, stable right basilar atelectasis and scar.   ASSESSMENT AND PLAN: An 81 year old male with multiple past medical history; Alzheimer's, hypertension, hyperlipidemia, chronic obstructive pulmonary disease, nursing home resident presented with cough, fever and acute respiratory distress, started on BiPAP in ER because of increased respiratory rate.  1.  Acute respiratory failure. This is due to chronic obstructive pulmonary disease. We will treat it and  continue BiPAP for now.  Discussed with the patient's son, who is healthcare power of attorney also, about the patient's wishes for cardiopulmonary resuscitation and intubation and he wanted him to be full code in any adverse event. I explained to him about fatal condition and also informed that if the patient does not improve or has some worsening in his condition, he might need to get intubated overnight. He understands and agreed for that.  2.  Chronic obstructive pulmonary disease exacerbation. We gave IV Solu-Medrol, DuoNebs, Spiriva, Rocephin and azithromycin.  3.  Hyperlipidemia. We will continue lovastatin  4.  Glaucoma. We will continue Travatan drops.  5.  Hypertension. We will continue lisinopril.  6.  CODE STATUS: Full code.   CONDITION: Is critical due to acute respiratory failure and rate is up to 35 and using accessory muscles of respiration.   Total time spent in this admission: critical care 50 minutes.    ____________________________ Ceasar Lund Anselm Jungling, MD vgv:NTS D: 07/12/2013 22:27:42 ET T: 07/12/2013 22:50:57 ET JOB#: [id number]  cc: Ceasar Lund. Anselm Jungling, MD, <Dictator> Kirstie Peri. Caryn Section, MD  Vaughan Basta MD ELECTRONICALLY SIGNED 07/13/2013 0:00

## 2014-09-06 NOTE — Discharge Summary (Signed)
PATIENT NAME:  Adam Singleton, Adam Singleton MR#:  [id number] DATE OF BIRTH:  08/20/1928  DATE OF ADMISSION:  07/12/2013 DATE OF DISCHARGE:  07/16/2013  DISCHARGE DIAGNOSES: 1.  Acute respiratory failure.  2.  Right lower lobe pneumonia.  3.  Acute chronic obstructive pulmonary disease exacerbation.  4.  Acute encephalopathy.  5.  Dementia.  6.  Hypotension.   IMAGING STUDIES: Include a chest x-ray which showed early infiltrate in the right lower lobe.   DISCHARGE MEDICATIONS: 1.  Memantine 10 mg oral 2 times a day.  2.  Donepezil 10 mg oral once a day. 3.  Prednisone 30 mg tapered over 3 days.  4.  Levaquin 500 mg oral once a day.  5.  Aspirin 81 mg daily.  6.  Ativan 1 mg oral 2 times a day as needed for agitation.  7.  Combivent Respimat 1 puff inhaled 4 times a day as needed for shortness of breath.  8.  Lovastatin 20 mg oral once a day.  9.  Travoprost 0.004% ophthalmic solution 1 drop to each affected eye once a day at bedtime.   DISCHARGE INSTRUCTIONS: Regular diet of regular consistency. Activity as tolerated with assistance at all times. Follow up with physician at the rehab in 1 to 2 days.   ADMITTING HISTORY AND PHYSICAL AND HOSPITAL COURSE: Please see detailed H and P dictated previously by Dr. Anselm Jungling. In brief, an 81 year old Caucasian male patient with history of advanced Alzheimer dementia, hyperlipidemia, hypertension, and COPD who presented to the hospital complaining of shortness of breath from [facility] home. The patient is normally in memory care unit, locked down unit. The patient was placed on BiPAP in the Emergency Room, admitted to the hospitalist service.   HOSPITAL COURSE: 1.  Right lower lobe pneumonia with acute respiratory failure and COPD exacerbation. The patient improved well, being transitioned off the BiPAP within 24 hours, later continued on nasal cannula, and by the day of discharge the patient is saturating 94% on room air. The patient has been on  antibiotics with ceftriaxone and azithromycin, which is being changed to Levaquin orally at time of discharge. He did have episodes of hypotension initially, but this has improved. His blood pressure presently is 115/41. His lisinopril is being held. His cultures have been negative. The patient feels well. No concerns. Not in any respiratory distress.  2.  Acute encephalopathy, Alzheimer dementia. The patient did have significant confusion secondary to inpatient delirium over his dementia for which he did receive IV Ativan and Haldol. Presently seems calm, has gotten up and worked with physical therapy, wishes to go back to the nursing home. I have discussed his care with the patient's son who mentioned that the patient does have on-and-off confusion at times. This seems to be a little worse now, and the patient would do better back in familiar surroundings at [facility] than in the hospital. He is afebrile with normal white count, is on room air, and will be discharged back to [facility].  Presently on examination, he is alert and awake, not oriented to person or place. Lungs sound clear.   TIME SPENT ON DISCHARGE: 40 minutes.  ____________________________ Leia Alf Kahlyn Shippey, MD srs:sb D: 07/16/2013 15:52:54 ET T: 07/16/2013 16:16:42 ET JOB#: [id number]  cc: Alveta Heimlich R. Johnpatrick Jenny, MD, <Dictator> [place] Caryn Section, MD Neita Carp MD ELECTRONICALLY SIGNED 07/18/2013 18:42

## 2014-09-06 NOTE — Discharge Summary (Signed)
PATIENT NAME:  Adam Singleton, Adam Singleton MR#:  [id number] DATE OF BIRTH:  May 17, 1928  DATE OF ADMISSION:  12/23/2013 DATE OF DISCHARGE:  12/25/2013  PRIMARY CARE PHYSICIAN:  Kirstie Peri. Fisher, MD.  PRESENTING COMPLAINT: Fever, tachycardia.   DISCHARGE DIAGNOSES:  1.  Sepsis, resolved.  2.  Acute bronchitis, severe, improving.  3.  Advanced dementia.  4.  History of chronic obstructive pulmonary disease, stable.  5.  Hypertension.   CODE STATUS: No code, DNR.    DISCHARGE INSTRUCTIONS:   1.  Physical therapy.  2.  Two-gram sodium diet.   MEDICATIONS:   1.  Levaquin 750 p.o. daily for 3 more days.  2.  Travatan 0.004 ophthalmic drops at bedtime.  3.  Namenda 10 mg b.i.d.  4.  Robitussin 5 mL q. 4 hours p.r.n.  5.  Donepezil, which is in Aricept, 10 mg at bedtime.  6.  Symbicort 2 puffs b.i.d. 160/4.5.  7.  Combivent Respimat 1 puff q.i.d. p.r.n.  8.  Lovastatin 20 mg with supper.  9.  Aspirin 81 mg daily.  10. Tylenol 650 q. 4 hours p.r.n.   FOLLOWUP : Dr. Caryn Section in 1-2 weeks.   LABORATORY DATA: White count on discharge is 15.6, platelet count is 135, creatinine 0.90. Blood cultures negative in 18-24 hours, influenza A plus B negative.   HOSPITAL COURSE:  [name] is a pleasant 81 year old Caucasian gentleman with history of advanced dementia, comes in from [facility] with fever, tachycardia, and was admitted with:  1.  Sepsis. He came in with fever, tachycardia and cough. Chest x-ray is negative for pneumonia, he likely has acute bronchitis with the kind of cough he was having. He was started on IV Levaquin, he remained afebrile. Blood cultures were negative. Sepsis resolved. White count was trending from 21,000 down to 15,000. He will finish up a course of Levaquin.  2.  Advanced dementia. Continue with Aricept.  3.  History of chronic obstructive pulmonary disease, continued with his Combivent and Symbicort. The oxygen saturations are stable.  4.  Hypertension, well controlled, not on  any blood pressure medications. Overall, the patient is stable.  Discharge back to [facility] today.   TIME SPENT: 40 minutes.    ____________________________ Hart Rochester Posey Pronto, MD sap:lt D: 12/25/2013 10:32:00 ET T: 12/25/2013 10:44:55 ET JOB#: [id number]  cc: Megumi Treaster A. Posey Pronto, MD, <Dictator> Kirstie Peri. Caryn Section, MD Ilda Basset MD ELECTRONICALLY SIGNED 12/25/2013 13:54

## 2014-09-06 NOTE — H&P (Signed)
PATIENT NAME:  Adam Singleton, Adam Singleton MR#:  [id number] DATE OF BIRTH:  07/04/1928  DATE OF ADMISSION:  12/23/2013  PRIMARY CARE PHYSICIAN: Kirstie Peri. Caryn Section, MD  REFERRING PHYSICIAN: Ahmed Prima, MD  CHIEF COMPLAINT: Fever, tachycardia.  HISTORY OF PRESENT ILLNESS: Adam Singleton is an 81 year old male with a history of advanced dementia, resides in a locked unit of the Alzheimer unit in [place] home for the last 1 year, is brought to the Emergency Department with a complaint of fever. Patient was noted to have fever of 100.9, tachycardia with a heart rate of 102. Patient was found to have elevated white blood cell count of 21,000 with a left shift. Unable to obtain any history from the patient. Chest x-ray did not show any obvious infiltrate. Influenza is negative. Patient is having wet cough. In the Emergency Department, patient was given vancomycin and cefepime. However, after receiving the vancomycin, patient developed hives. Vancomycin was discontinued.   PAST MEDICAL HISTORY: 1.  Hypertension. 2.  Hyperlipidemia. 3.  COPD. 4.  Glaucoma. 5.  Alzheimer dementia. 6.  Malignant melanoma.   ALLERGIES: VANCOMYCIN.   HOME MEDICATIONS: 1.  Tylenol 325 mg 2 tablets every 4 hours as needed.  2.  Travatan eye drops to the affected eye. 3.  Symbicort 2 puffs 2 times a day. 4.  Robitussin 100 mg every 4 hours as needed. 5.  Namenda 10 mg 2 times a day. 6.  Mevacor 20 mg once a day. 7.  Combivent 1 puff, inhalations 4 times a day. 8.  Aspirin 81 mg once a day. 9.  Aricept 10 mg once a day.  SOCIAL HISTORY: A previous history of smoking, quit in 1990s. No history of alcohol and drug use. Currently lives in a nursing home in [place] unit.   FAMILY HISTORY: Positive for cancer of the bone in his sister.  REVIEW OF SYSTEMS: Could not be obtained from the patient secondary to patient's advanced dementia.   PHYSICAL EXAMINATION: GENERAL: This is a thin-built male lying down in the bed  not in distress. VITAL SIGNS: Temperature 100.9, pulse 102, blood pressure 114/57, respiratory rate of 26, oxygen saturation is 96% on room air. HEENT: Head normocephalic, atraumatic. There is no scleral icterus. Conjunctivae normal. Pupils equal and reactive to light. Mucous membranes are moist. No pharyngeal erythema. NECK: Supple. No lymphadenopathy. No JVD. No carotid bruit. CHEST: There is no focal tenderness. LUNGS: Decreased breath sounds, bilateral lower lobes.  HEART: S1, S2 regular. No murmurs are heard. ABDOMEN: Bowel sounds present. Soft, nontender, nondistended. No hepatosplenomegaly. EXTREMITIES: No pedal edema. Pulses 2+. SKIN: No rash or lesions.  MUSCULOSKELETAL: Good range of motion.  NEUROLOGIC: Patient is not oriented to place, person, and time. No apparent cranial nerve abnormalities. Could not examine motor and sensory, as patient was not cooperative.  LABORATORIES: 1.  CBC: WBC of 21,000, hemoglobin 13.9, platelet count of 165,000. 2.  CMP is completely within normal limits. 3.  ABG is well within normal limits. 4.  UA negative for nitrites and leukocyte esterase.  5.  A chest x-ray, 1-view, portable showed low lung volumes without radiographic evidence of acute cardiopulmonary disease.  ASSESSMENT AND PLAN: Adam Singleton is an 81 year old male who comes to the Emergency Department with sepsis of unknown source. 1.  Sepsis. Highly concerning about possible underlying pneumonia. Patient is a little dehydrated. Continue with intravenous fluids and to follow up. Keep the patient on levofloxacin. 2.  Possible pneumonia. Continue with levofloxacin and follow up. 3.  Advanced dementia. No current. Continue with Namenda. 4.  History of chronic obstructive pulmonary disease. Continue with Combivent as needed. 5.  Hypertension. Currently well controlled. Continue with home  medications.  TIME SPENT: 50 minutes.   ____________________________ Monica Becton,  MD pv:sk D: 12/24/2013 00:59:58 ET T: 12/24/2013 01:51:34 ET JOB#: [id number]  cc: Monica Becton, MD, <Dictator> Kirstie Peri. Caryn Section, MD Monica Becton MD ELECTRONICALLY SIGNED 01/01/2014 21:27

## 2014-09-24 DIAGNOSIS — Z8679 Personal history of other diseases of the circulatory system: Secondary | ICD-10-CM | POA: Insufficient documentation

## 2014-09-24 DIAGNOSIS — N429 Disorder of prostate, unspecified: Secondary | ICD-10-CM | POA: Insufficient documentation

## 2014-09-24 DIAGNOSIS — R0609 Other forms of dyspnea: Secondary | ICD-10-CM | POA: Insufficient documentation

## 2014-09-24 DIAGNOSIS — D696 Thrombocytopenia, unspecified: Secondary | ICD-10-CM | POA: Insufficient documentation

## 2014-09-24 DIAGNOSIS — B356 Tinea cruris: Secondary | ICD-10-CM | POA: Insufficient documentation

## 2014-09-24 DIAGNOSIS — L0390 Cellulitis, unspecified: Secondary | ICD-10-CM | POA: Insufficient documentation

## 2014-09-24 DIAGNOSIS — L989 Disorder of the skin and subcutaneous tissue, unspecified: Secondary | ICD-10-CM | POA: Insufficient documentation

## 2014-09-24 DIAGNOSIS — Z86010 Personal history of colonic polyps: Secondary | ICD-10-CM | POA: Insufficient documentation

## 2014-09-24 DIAGNOSIS — Z85820 Personal history of malignant melanoma of skin: Secondary | ICD-10-CM | POA: Insufficient documentation

## 2014-09-24 DIAGNOSIS — J449 Chronic obstructive pulmonary disease, unspecified: Secondary | ICD-10-CM | POA: Insufficient documentation

## 2014-11-05 ENCOUNTER — Ambulatory Visit (INDEPENDENT_AMBULATORY_CARE_PROVIDER_SITE_OTHER): Payer: Medicare Other | Admitting: Family Medicine

## 2014-11-05 ENCOUNTER — Encounter: Payer: Self-pay | Admitting: Family Medicine

## 2014-11-05 VITALS — BP 124/64 | HR 60 | Temp 97.4°F | Resp 18

## 2014-11-05 DIAGNOSIS — Z86718 Personal history of other venous thrombosis and embolism: Secondary | ICD-10-CM | POA: Insufficient documentation

## 2014-11-05 DIAGNOSIS — N139 Obstructive and reflux uropathy, unspecified: Secondary | ICD-10-CM | POA: Insufficient documentation

## 2014-11-05 DIAGNOSIS — R739 Hyperglycemia, unspecified: Secondary | ICD-10-CM | POA: Insufficient documentation

## 2014-11-05 DIAGNOSIS — D692 Other nonthrombocytopenic purpura: Secondary | ICD-10-CM

## 2014-11-05 DIAGNOSIS — Z86711 Personal history of pulmonary embolism: Secondary | ICD-10-CM | POA: Insufficient documentation

## 2014-11-05 DIAGNOSIS — J449 Chronic obstructive pulmonary disease, unspecified: Secondary | ICD-10-CM

## 2014-11-05 DIAGNOSIS — F0280 Dementia in other diseases classified elsewhere without behavioral disturbance: Secondary | ICD-10-CM

## 2014-11-05 DIAGNOSIS — G309 Alzheimer's disease, unspecified: Secondary | ICD-10-CM

## 2014-11-05 DIAGNOSIS — B029 Zoster without complications: Secondary | ICD-10-CM | POA: Insufficient documentation

## 2014-11-05 DIAGNOSIS — D696 Thrombocytopenia, unspecified: Secondary | ICD-10-CM

## 2014-11-05 NOTE — Progress Notes (Signed)
Patient: Adam Singleton Male    DOB: [birth year]   81 y.o.   MRN: [id number] Visit Date: 11/05/2014  Today's Provider: Lelon Huh, MD   No chief complaint on file.  Subjective:    HPI    Hypertension, follow-up:  BP Readings from Last 3 Encounters:  07/08/14 152/70    He was last seen for hypertension 4 months ago.  BP at that visit was 152/70. Management changes since that visit include none .He reports excellent compliance with treatment. He is not having side effects.   Patient denies chest pain, dyspnea, irregular heart beat, palpitations and syncope.   ------------------------------------------------------------------------    Lipid/Cholesterol, Follow-up:   Last seen for this 4 months ago.  Management changes since that visit include none.  Last Lipid Panel:    Component Value Date/Time   CHOL 157 05/30/2014   TRIG 87 05/30/2014   HDL 48 05/30/2014   LDLCALC 92 05/30/2014    He reports excellent compliance with treatment. He is not having side effects.   Wt Readings from Last 3 Encounters:  07/08/14 167 lb (75.751 kg)    ------------------------------------------------------------------------  COPD, Follow up:    He reports excellent compliance with treatment. He isnot  having side effects.  Marland Kitchen  ------------------------------------------------------------------------   Dementia He is here with his son today who reports the patient's memory and mental alertness has been about the same. He is taking Aricept and Namenda regularly and tolerating well without adverse effects.    Patient's son reports that a few weeks ago part of his eye turned blood red and took a week or two to heal up, which sounds like a subconcjunctival hemorrhage. He has also noticed he seems to bruise a little more easily. Patient does have a history of thrombocytopenia with baseline platelets running in the low 100s.    Previous Medications   ACETAMINOPHEN (TYLENOL) 325 MG  TABLET    Take 2 tablets by mouth every 4 (four) hours as needed. for mild pain or fever   ALBUTEROL (PROVENTIL HFA;VENTOLIN HFA) 108 (90 BASE) MCG/ACT INHALER    Inhale 2 puffs into the lungs. wth spacer every 4-6 hours, as needed for shortness of breath   ASPIRIN 81 MG EC TABLET    Take 1 tablet by mouth daily.   BUDESONIDE-FORMOTEROL (SYMBICORT) 160-4.5 MCG/ACT INHALER    Inhale 2 puffs into the lungs 2 (two) times daily. For chronic obstructive pulmonary   DEXTROMETHORPHAN-GUAIFENESIN 10-100 MG/5ML LIQUID    Take 5 mLs by mouth every 4 (four) hours as needed. For cough   DONEPEZIL (ARICEPT) 10 MG TABLET    Take 1 tablet by mouth at bedtime.   IPRATROPIUM-ALBUTEROL (COMBIVENT) 20-100 MCG/ACT AERS RESPIMAT    Inhale 1 puff into the lungs 4 (four) times daily as needed. For shortness of breath   KETOCONAZOLE (NIZORAL) 2 % CREAM    Apply 1 application topically daily. For 14 days externally to inguinal and scrotum   LOVASTATIN (MEVACOR) 20 MG TABLET    Take 1 tablet by mouth daily.   MEMANTINE (NAMENDA) 10 MG TABLET    Take 1 tablet by mouth 2 (two) times daily.   OXYGEN    2 liters continuous   TRAVOPROST, BAK FREE, (TRAVATAN) 0.004 % SOLN OPHTHALMIC SOLUTION    Apply 1 drop to eye at bedtime.    Review of Systems  Respiratory: Negative for cough, shortness of breath and wheezing.   Cardiovascular: Negative for chest pain and palpitations.    History  Substance Use Topics  . Smoking status: Former Smoker -- 1.00 packs/day for 40 years    Types: Cigarettes    Quit date: 05/16/1988  . Smokeless tobacco: Not on file  . Alcohol Use: No   Objective:   BP 124/64 mmHg  Pulse 60  Temp(Src) 97.4 F (36.3 C) (Oral)  Resp 18  SpO2 96%  Physical Exam  General Appearance:    Alert, cooperative, no distress  Eyes:    PERRL, conjunctiva/corneas clear, EOM's intact       Lungs:     Clear to auscultation bilaterally, respirations unlabored  Heart:    Regular rate and rhythm  Neurologic:    Awake, alert, oriented x 2. No apparent focal neurological           defect.   Skin   Few small purporic lesions scattered about upper extremities.         Assessment & Plan:      1. Thrombocytopenia  By history has recently had subconjuctival hemorrhage which has completely resolved. Make sure platelets are adequate.  - CBC  2. Hyperglycemia  - Hemoglobin A1c - Renal function panel  3. Alzheimer's dementia Stable on current medications.   4. Chronic obstructive pulmonary disease, unspecified COPD, unspecified chronic bronchitis type Well controlled on current inhalers. UTD on pneumonia vaccines.   5. Senile purpura May be aggravated by thrombocytopenia as above. See how platelets look.

## 2014-11-06 LAB — RENAL FUNCTION PANEL
ALBUMIN: 3.7 g/dL (ref 3.5–4.7)
BUN/Creatinine Ratio: 21 (ref 10–22)
BUN: 15 mg/dL (ref 8–27)
CHLORIDE: 103 mmol/L (ref 97–108)
CO2: 22 mmol/L (ref 18–29)
CREATININE: 0.73 mg/dL — AB (ref 0.76–1.27)
Calcium: 8.8 mg/dL (ref 8.6–10.2)
GFR calc non Af Amer: 85 mL/min/{1.73_m2} (ref >59)
GFR, EST AFRICAN AMERICAN: 98 mL/min/{1.73_m2} (ref >59)
Glucose: 120 mg/dL — ABNORMAL HIGH (ref 65–99)
PHOSPHORUS: 3.8 mg/dL (ref 2.5–4.5)
Potassium: 4.4 mmol/L (ref 3.5–5.2)
Sodium: 143 mmol/L (ref 134–144)

## 2014-11-06 LAB — CBC
HEMATOCRIT: 40.4 % (ref 37.5–51.0)
Hemoglobin: 13.9 g/dL (ref 12.6–17.7)
MCH: 31.7 pg (ref 26.6–33.0)
MCHC: 34.4 g/dL (ref 31.5–35.7)
MCV: 92 fL (ref 79–97)
Platelets: 119 10*3/uL — ABNORMAL LOW (ref 150–379)
RBC: 4.38 x10E6/uL (ref 4.14–5.80)
RDW: 15.1 % (ref 12.3–15.4)
WBC: 7.4 10*3/uL (ref 3.4–10.8)

## 2014-11-06 LAB — HEMOGLOBIN A1C
ESTIMATED AVERAGE GLUCOSE: 126 mg/dL
Hgb A1c MFr Bld: 6 % — ABNORMAL HIGH (ref 4.8–5.6)

## 2015-01-12 ENCOUNTER — Telehealth: Payer: Self-pay | Admitting: Family Medicine

## 2015-01-12 NOTE — Telephone Encounter (Signed)
Pt's son called saying they called him from [facility] saying they were going to come by here with a form for you to sign for extra care for him from the hospice workers there.  He just wanted to give you an ok for that to be done.  If you have any questions you can call him back [phone]   Thanks, [name]

## 2015-01-12 NOTE — Telephone Encounter (Signed)
Dr. Caryn Section, do you need me to call for  More information or is this ok for Douglass Rivers to bring form by the office?

## 2015-02-26 ENCOUNTER — Ambulatory Visit (INDEPENDENT_AMBULATORY_CARE_PROVIDER_SITE_OTHER): Payer: Medicare Other | Admitting: Family Medicine

## 2015-02-26 ENCOUNTER — Encounter: Payer: Self-pay | Admitting: Family Medicine

## 2015-02-26 VITALS — BP 122/60 | HR 66 | Temp 98.3°F | Resp 16

## 2015-02-26 DIAGNOSIS — L989 Disorder of the skin and subcutaneous tissue, unspecified: Secondary | ICD-10-CM | POA: Diagnosis not present

## 2015-02-26 DIAGNOSIS — Z23 Encounter for immunization: Secondary | ICD-10-CM

## 2015-02-26 NOTE — Progress Notes (Signed)
Patient: Adam Singleton Male    DOB: 1928-06-29   81 y.o.   MRN: [id number] Visit Date: 02/26/2015  Today's Provider: Lelon Huh, MD   Chief Complaint  Patient presents with  . Mass    on left side of nose x 1 month   Subjective:    HPI  Skin Lesion: Patient comes in today for an evaluation of a skin lesion located on the left side of his nose. Patient son states he first noticed it 1 month ago and it appeared as a small pimple. Patient son states the pimple has grown in size and has changed color. Patient denies any pain from the lesion but he reports it is itchy.  Patient has a past history of skin cancer.     Allergies  Allergen Reactions  . Vancomycin Hcl Rash   Previous Medications   ACETAMINOPHEN (TYLENOL) 325 MG TABLET    Take 2 tablets by mouth every 4 (four) hours as needed. for mild pain or fever   ALBUTEROL (PROVENTIL HFA;VENTOLIN HFA) 108 (90 BASE) MCG/ACT INHALER    Inhale 2 puffs into the lungs. wth spacer every 4-6 hours, as needed for shortness of breath   ASPIRIN 81 MG EC TABLET    Take 1 tablet by mouth daily.   BUDESONIDE-FORMOTEROL (SYMBICORT) 160-4.5 MCG/ACT INHALER    Inhale 2 puffs into the lungs 2 (two) times daily. For chronic obstructive pulmonary   DEXTROMETHORPHAN-GUAIFENESIN 10-100 MG/5ML LIQUID    Take 5 mLs by mouth every 4 (four) hours as needed. For cough   DONEPEZIL (ARICEPT) 10 MG TABLET    Take 1 tablet by mouth at bedtime.   IPRATROPIUM-ALBUTEROL (COMBIVENT) 20-100 MCG/ACT AERS RESPIMAT    Inhale 1 puff into the lungs 4 (four) times daily as needed. For shortness of breath   KETOCONAZOLE (NIZORAL) 2 % CREAM    Apply 1 application topically daily. For 14 days externally to inguinal and scrotum   LOVASTATIN (MEVACOR) 20 MG TABLET    Take 1 tablet by mouth daily.   MEMANTINE (NAMENDA) 10 MG TABLET    Take 1 tablet by mouth 2 (two) times daily.   OXYGEN    2 liters continuous   TRAVOPROST, BAK FREE, (TRAVATAN) 0.004 % SOLN OPHTHALMIC  SOLUTION    Apply 1 drop to eye at bedtime.    Review of Systems  Constitutional: Negative for fever, chills and appetite change.  Eyes: Positive for visual disturbance (possible trouble seeing objects).  Respiratory: Negative for chest tightness, shortness of breath and wheezing.   Cardiovascular: Negative for chest pain and palpitations.  Gastrointestinal: Negative for nausea, vomiting and abdominal pain.  Skin: Positive for color change.       Skin lesion on left side of nose    Social History  Substance Use Topics  . Smoking status: Former Smoker -- 1.00 packs/day for 40 years    Types: Cigarettes    Quit date: 05/16/1988  . Smokeless tobacco: Not on file  . Alcohol Use: No   Objective:   BP 122/60 mmHg  Pulse 66  Temp(Src) 98.3 F (36.8 C) (Oral)  Resp 16  SpO2 96%  Physical Exam  Skin: Large exophytic pigmented lesion left external nares.     Assessment & Plan:     1. Non-healing skin lesion of nose  - Ambulatory referral to Dermatology  2. Need for influenza vaccination  - Flu vaccine HIGH DOSE PF (Fluzone High dose)  Lelon Huh, MD  [facility]

## 2015-03-28 DIAGNOSIS — Z9981 Dependence on supplemental oxygen: Secondary | ICD-10-CM | POA: Diagnosis not present

## 2015-03-28 DIAGNOSIS — M6281 Muscle weakness (generalized): Secondary | ICD-10-CM | POA: Diagnosis not present

## 2015-03-28 DIAGNOSIS — L989 Disorder of the skin and subcutaneous tissue, unspecified: Secondary | ICD-10-CM | POA: Diagnosis not present

## 2015-03-28 DIAGNOSIS — Z85828 Personal history of other malignant neoplasm of skin: Secondary | ICD-10-CM | POA: Diagnosis not present

## 2015-03-28 DIAGNOSIS — R2689 Other abnormalities of gait and mobility: Secondary | ICD-10-CM | POA: Diagnosis not present

## 2015-03-28 DIAGNOSIS — Z87891 Personal history of nicotine dependence: Secondary | ICD-10-CM | POA: Diagnosis not present

## 2015-03-28 DIAGNOSIS — I10 Essential (primary) hypertension: Secondary | ICD-10-CM | POA: Diagnosis not present

## 2015-03-28 DIAGNOSIS — J449 Chronic obstructive pulmonary disease, unspecified: Secondary | ICD-10-CM | POA: Diagnosis not present

## 2015-03-28 DIAGNOSIS — Z9181 History of falling: Secondary | ICD-10-CM | POA: Diagnosis not present

## 2015-03-28 DIAGNOSIS — F0390 Unspecified dementia without behavioral disturbance: Secondary | ICD-10-CM | POA: Diagnosis not present

## 2015-03-31 ENCOUNTER — Telehealth: Payer: Self-pay | Admitting: Family Medicine

## 2015-03-31 DIAGNOSIS — L989 Disorder of the skin and subcutaneous tissue, unspecified: Secondary | ICD-10-CM | POA: Diagnosis not present

## 2015-03-31 DIAGNOSIS — R2689 Other abnormalities of gait and mobility: Secondary | ICD-10-CM | POA: Diagnosis not present

## 2015-03-31 DIAGNOSIS — M6281 Muscle weakness (generalized): Secondary | ICD-10-CM | POA: Diagnosis not present

## 2015-03-31 DIAGNOSIS — F0390 Unspecified dementia without behavioral disturbance: Secondary | ICD-10-CM | POA: Diagnosis not present

## 2015-03-31 DIAGNOSIS — I10 Essential (primary) hypertension: Secondary | ICD-10-CM | POA: Diagnosis not present

## 2015-03-31 DIAGNOSIS — J449 Chronic obstructive pulmonary disease, unspecified: Secondary | ICD-10-CM | POA: Diagnosis not present

## 2015-03-31 NOTE — Telephone Encounter (Signed)
Pt son Ulice Dash called to get info about a home health agency coming in to see his dad.  Pt is asking who ordered this and why.  CB#[phone]

## 2015-04-01 DIAGNOSIS — I10 Essential (primary) hypertension: Secondary | ICD-10-CM | POA: Diagnosis not present

## 2015-04-01 DIAGNOSIS — F0390 Unspecified dementia without behavioral disturbance: Secondary | ICD-10-CM | POA: Diagnosis not present

## 2015-04-01 DIAGNOSIS — L989 Disorder of the skin and subcutaneous tissue, unspecified: Secondary | ICD-10-CM | POA: Diagnosis not present

## 2015-04-01 DIAGNOSIS — J449 Chronic obstructive pulmonary disease, unspecified: Secondary | ICD-10-CM | POA: Diagnosis not present

## 2015-04-01 DIAGNOSIS — R2689 Other abnormalities of gait and mobility: Secondary | ICD-10-CM | POA: Diagnosis not present

## 2015-04-01 DIAGNOSIS — M6281 Muscle weakness (generalized): Secondary | ICD-10-CM | POA: Diagnosis not present

## 2015-04-01 NOTE — Telephone Encounter (Signed)
Please advise 

## 2015-04-02 NOTE — Telephone Encounter (Signed)
Pt's son Ulice Dash called back to ask about the home health going to [facility]. I advised that we received a request for home health. Ulice Dash would like a nurse to return his call just to make sure he understands the plan moving forward and that he is still to take pt to the dermatologist appt later this month. Please advise. Thanks TNP

## 2015-04-02 NOTE — Telephone Encounter (Signed)
We received request last week for home health to take care of wound on his face.

## 2015-04-03 DIAGNOSIS — J449 Chronic obstructive pulmonary disease, unspecified: Secondary | ICD-10-CM | POA: Diagnosis not present

## 2015-04-03 DIAGNOSIS — R2689 Other abnormalities of gait and mobility: Secondary | ICD-10-CM | POA: Diagnosis not present

## 2015-04-03 DIAGNOSIS — F0390 Unspecified dementia without behavioral disturbance: Secondary | ICD-10-CM | POA: Diagnosis not present

## 2015-04-03 DIAGNOSIS — I10 Essential (primary) hypertension: Secondary | ICD-10-CM | POA: Diagnosis not present

## 2015-04-03 DIAGNOSIS — M6281 Muscle weakness (generalized): Secondary | ICD-10-CM | POA: Diagnosis not present

## 2015-04-03 DIAGNOSIS — L989 Disorder of the skin and subcutaneous tissue, unspecified: Secondary | ICD-10-CM | POA: Diagnosis not present

## 2015-04-06 ENCOUNTER — Ambulatory Visit (INDEPENDENT_AMBULATORY_CARE_PROVIDER_SITE_OTHER): Payer: Medicare Other | Admitting: Family Medicine

## 2015-04-06 ENCOUNTER — Encounter: Payer: Self-pay | Admitting: Family Medicine

## 2015-04-06 VITALS — BP 114/74 | HR 61 | Temp 98.5°F | Resp 18

## 2015-04-06 DIAGNOSIS — Z23 Encounter for immunization: Secondary | ICD-10-CM | POA: Diagnosis not present

## 2015-04-06 DIAGNOSIS — F0280 Dementia in other diseases classified elsewhere without behavioral disturbance: Secondary | ICD-10-CM

## 2015-04-06 DIAGNOSIS — J449 Chronic obstructive pulmonary disease, unspecified: Secondary | ICD-10-CM

## 2015-04-06 DIAGNOSIS — G309 Alzheimer's disease, unspecified: Secondary | ICD-10-CM | POA: Diagnosis not present

## 2015-04-06 NOTE — Telephone Encounter (Signed)
Patient was seen in office today.  

## 2015-04-06 NOTE — Progress Notes (Signed)
       Patient: Adam Singleton Male    DOB: [birth year]   81 y.o.   MRN: [id number] Visit Date: 04/06/2015  Today's Provider: Lelon Huh, MD   Chief Complaint  Patient presents with  . Follow-up   Subjective:    HPI  Follow up COPD. Is doing well with current regiment of inhalers which he is tolerating well.   He continues to take Aricept which he is tolerating well.     Allergies  Allergen Reactions  . Vancomycin Hcl Rash   Previous Medications   ACETAMINOPHEN (TYLENOL) 325 MG TABLET    Take 2 tablets by mouth every 4 (four) hours as needed. for mild pain or fever   ALBUTEROL (PROVENTIL HFA;VENTOLIN HFA) 108 (90 BASE) MCG/ACT INHALER    Inhale 2 puffs into the lungs. wth spacer every 4-6 hours, as needed for shortness of breath   ASPIRIN 81 MG EC TABLET    Take 1 tablet by mouth daily.   BUDESONIDE-FORMOTEROL (SYMBICORT) 160-4.5 MCG/ACT INHALER    Inhale 2 puffs into the lungs 2 (two) times daily. For chronic obstructive pulmonary   DEXTROMETHORPHAN-GUAIFENESIN 10-100 MG/5ML LIQUID    Take 5 mLs by mouth every 4 (four) hours as needed. For cough   DONEPEZIL (ARICEPT) 10 MG TABLET    Take 1 tablet by mouth at bedtime.   IPRATROPIUM-ALBUTEROL (COMBIVENT) 20-100 MCG/ACT AERS RESPIMAT    Inhale 1 puff into the lungs 4 (four) times daily as needed. For shortness of breath   KETOCONAZOLE (NIZORAL) 2 % CREAM    Apply 1 application topically daily. For 14 days externally to inguinal and scrotum   LOVASTATIN (MEVACOR) 20 MG TABLET    Take 1 tablet by mouth daily.   MEMANTINE (NAMENDA) 10 MG TABLET    Take 1 tablet by mouth 2 (two) times daily.   OXYGEN    2 liters continuous   TRAVOPROST, BAK FREE, (TRAVATAN) 0.004 % SOLN OPHTHALMIC SOLUTION    Apply 1 drop to eye at bedtime.    Review of Systems  Constitutional: Negative for fever, chills and fatigue.  HENT: Negative for congestion, ear pain, facial swelling and postnasal drip.   Respiratory: Negative for cough, chest tightness,  shortness of breath and wheezing.   Cardiovascular: Negative for palpitations and leg swelling.    Social History  Substance Use Topics  . Smoking status: Former Smoker -- 1.00 packs/day for 40 years    Types: Cigarettes    Quit date: 05/16/1988  . Smokeless tobacco: Not on file  . Alcohol Use: No   Objective:   BP 114/74 mmHg  Pulse 61  Temp(Src) 98.5 F (36.9 C) (Oral)  Resp 18  SpO2 96%  Physical Exam   General Appearance:    Alert, cooperative, no distress  Eyes:    PERRL, conjunctiva/corneas clear, EOM's intact       Lungs:     Clear to auscultation bilaterally, respirations unlabored  Heart:    Regular rate and rhythm  Neurologic:   Awake, alert, oriented x 3. Oriented to person only.            Assessment & Plan:     1. Need for Tdap vaccination -Tdap  2. Alzheimer's dementia Stable,  Continue current medications.    3. Chronic obstructive pulmonary disease, unspecified COPD type ([place]) Doing well on current inhalers.        Lelon Huh, MD  [facility]

## 2015-04-07 DIAGNOSIS — R2689 Other abnormalities of gait and mobility: Secondary | ICD-10-CM | POA: Diagnosis not present

## 2015-04-07 DIAGNOSIS — I10 Essential (primary) hypertension: Secondary | ICD-10-CM | POA: Diagnosis not present

## 2015-04-07 DIAGNOSIS — J449 Chronic obstructive pulmonary disease, unspecified: Secondary | ICD-10-CM | POA: Diagnosis not present

## 2015-04-07 DIAGNOSIS — L989 Disorder of the skin and subcutaneous tissue, unspecified: Secondary | ICD-10-CM | POA: Diagnosis not present

## 2015-04-07 DIAGNOSIS — M6281 Muscle weakness (generalized): Secondary | ICD-10-CM | POA: Diagnosis not present

## 2015-04-07 DIAGNOSIS — F0390 Unspecified dementia without behavioral disturbance: Secondary | ICD-10-CM | POA: Diagnosis not present

## 2015-04-08 DIAGNOSIS — M6281 Muscle weakness (generalized): Secondary | ICD-10-CM | POA: Diagnosis not present

## 2015-04-08 DIAGNOSIS — F0390 Unspecified dementia without behavioral disturbance: Secondary | ICD-10-CM | POA: Diagnosis not present

## 2015-04-08 DIAGNOSIS — R2689 Other abnormalities of gait and mobility: Secondary | ICD-10-CM | POA: Diagnosis not present

## 2015-04-08 DIAGNOSIS — J449 Chronic obstructive pulmonary disease, unspecified: Secondary | ICD-10-CM | POA: Diagnosis not present

## 2015-04-08 DIAGNOSIS — I10 Essential (primary) hypertension: Secondary | ICD-10-CM | POA: Diagnosis not present

## 2015-04-08 DIAGNOSIS — L989 Disorder of the skin and subcutaneous tissue, unspecified: Secondary | ICD-10-CM | POA: Diagnosis not present

## 2015-04-13 ENCOUNTER — Encounter: Payer: Medicare Other | Admitting: Family Medicine

## 2015-04-13 DIAGNOSIS — D485 Neoplasm of uncertain behavior of skin: Secondary | ICD-10-CM | POA: Diagnosis not present

## 2015-04-13 DIAGNOSIS — F0390 Unspecified dementia without behavioral disturbance: Secondary | ICD-10-CM | POA: Diagnosis not present

## 2015-04-13 DIAGNOSIS — R2689 Other abnormalities of gait and mobility: Secondary | ICD-10-CM | POA: Diagnosis not present

## 2015-04-13 DIAGNOSIS — I10 Essential (primary) hypertension: Secondary | ICD-10-CM | POA: Diagnosis not present

## 2015-04-13 DIAGNOSIS — L989 Disorder of the skin and subcutaneous tissue, unspecified: Secondary | ICD-10-CM | POA: Diagnosis not present

## 2015-04-13 DIAGNOSIS — C44311 Basal cell carcinoma of skin of nose: Secondary | ICD-10-CM | POA: Diagnosis not present

## 2015-04-17 DIAGNOSIS — I10 Essential (primary) hypertension: Secondary | ICD-10-CM | POA: Diagnosis not present

## 2015-04-17 DIAGNOSIS — R2689 Other abnormalities of gait and mobility: Secondary | ICD-10-CM | POA: Diagnosis not present

## 2015-04-17 DIAGNOSIS — L989 Disorder of the skin and subcutaneous tissue, unspecified: Secondary | ICD-10-CM | POA: Diagnosis not present

## 2015-04-17 DIAGNOSIS — J449 Chronic obstructive pulmonary disease, unspecified: Secondary | ICD-10-CM | POA: Diagnosis not present

## 2015-04-17 DIAGNOSIS — F0390 Unspecified dementia without behavioral disturbance: Secondary | ICD-10-CM | POA: Diagnosis not present

## 2015-04-17 DIAGNOSIS — M6281 Muscle weakness (generalized): Secondary | ICD-10-CM | POA: Diagnosis not present

## 2015-04-21 DIAGNOSIS — J449 Chronic obstructive pulmonary disease, unspecified: Secondary | ICD-10-CM | POA: Diagnosis not present

## 2015-04-21 DIAGNOSIS — F0390 Unspecified dementia without behavioral disturbance: Secondary | ICD-10-CM | POA: Diagnosis not present

## 2015-04-21 DIAGNOSIS — L989 Disorder of the skin and subcutaneous tissue, unspecified: Secondary | ICD-10-CM | POA: Diagnosis not present

## 2015-04-21 DIAGNOSIS — R2689 Other abnormalities of gait and mobility: Secondary | ICD-10-CM | POA: Diagnosis not present

## 2015-04-21 DIAGNOSIS — M6281 Muscle weakness (generalized): Secondary | ICD-10-CM | POA: Diagnosis not present

## 2015-04-21 DIAGNOSIS — I10 Essential (primary) hypertension: Secondary | ICD-10-CM | POA: Diagnosis not present

## 2015-04-22 ENCOUNTER — Encounter (INDEPENDENT_AMBULATORY_CARE_PROVIDER_SITE_OTHER): Payer: Medicare Other | Admitting: Family Medicine

## 2015-04-22 DIAGNOSIS — M6281 Muscle weakness (generalized): Secondary | ICD-10-CM | POA: Diagnosis not present

## 2015-04-22 DIAGNOSIS — I10 Essential (primary) hypertension: Secondary | ICD-10-CM | POA: Diagnosis not present

## 2015-04-22 DIAGNOSIS — R2689 Other abnormalities of gait and mobility: Secondary | ICD-10-CM

## 2015-04-22 DIAGNOSIS — F0390 Unspecified dementia without behavioral disturbance: Secondary | ICD-10-CM | POA: Diagnosis not present

## 2015-04-22 DIAGNOSIS — J449 Chronic obstructive pulmonary disease, unspecified: Secondary | ICD-10-CM | POA: Diagnosis not present

## 2015-04-22 DIAGNOSIS — L989 Disorder of the skin and subcutaneous tissue, unspecified: Secondary | ICD-10-CM | POA: Diagnosis not present

## 2015-04-24 DIAGNOSIS — F0390 Unspecified dementia without behavioral disturbance: Secondary | ICD-10-CM | POA: Diagnosis not present

## 2015-04-24 DIAGNOSIS — I10 Essential (primary) hypertension: Secondary | ICD-10-CM | POA: Diagnosis not present

## 2015-04-24 DIAGNOSIS — M6281 Muscle weakness (generalized): Secondary | ICD-10-CM | POA: Diagnosis not present

## 2015-04-24 DIAGNOSIS — R2689 Other abnormalities of gait and mobility: Secondary | ICD-10-CM | POA: Diagnosis not present

## 2015-04-24 DIAGNOSIS — J449 Chronic obstructive pulmonary disease, unspecified: Secondary | ICD-10-CM | POA: Diagnosis not present

## 2015-04-24 DIAGNOSIS — L989 Disorder of the skin and subcutaneous tissue, unspecified: Secondary | ICD-10-CM | POA: Diagnosis not present

## 2015-04-29 DIAGNOSIS — R2689 Other abnormalities of gait and mobility: Secondary | ICD-10-CM | POA: Diagnosis not present

## 2015-04-29 DIAGNOSIS — J449 Chronic obstructive pulmonary disease, unspecified: Secondary | ICD-10-CM | POA: Diagnosis not present

## 2015-04-29 DIAGNOSIS — F0390 Unspecified dementia without behavioral disturbance: Secondary | ICD-10-CM | POA: Diagnosis not present

## 2015-04-29 DIAGNOSIS — I10 Essential (primary) hypertension: Secondary | ICD-10-CM | POA: Diagnosis not present

## 2015-04-29 DIAGNOSIS — M6281 Muscle weakness (generalized): Secondary | ICD-10-CM | POA: Diagnosis not present

## 2015-04-29 DIAGNOSIS — L989 Disorder of the skin and subcutaneous tissue, unspecified: Secondary | ICD-10-CM | POA: Diagnosis not present

## 2015-05-01 DIAGNOSIS — L989 Disorder of the skin and subcutaneous tissue, unspecified: Secondary | ICD-10-CM | POA: Diagnosis not present

## 2015-05-01 DIAGNOSIS — J449 Chronic obstructive pulmonary disease, unspecified: Secondary | ICD-10-CM | POA: Diagnosis not present

## 2015-05-01 DIAGNOSIS — M6281 Muscle weakness (generalized): Secondary | ICD-10-CM | POA: Diagnosis not present

## 2015-05-01 DIAGNOSIS — F0390 Unspecified dementia without behavioral disturbance: Secondary | ICD-10-CM | POA: Diagnosis not present

## 2015-05-01 DIAGNOSIS — R2689 Other abnormalities of gait and mobility: Secondary | ICD-10-CM | POA: Diagnosis not present

## 2015-05-01 DIAGNOSIS — I10 Essential (primary) hypertension: Secondary | ICD-10-CM | POA: Diagnosis not present

## 2015-05-06 DIAGNOSIS — I10 Essential (primary) hypertension: Secondary | ICD-10-CM | POA: Diagnosis not present

## 2015-05-06 DIAGNOSIS — F0390 Unspecified dementia without behavioral disturbance: Secondary | ICD-10-CM | POA: Diagnosis not present

## 2015-05-06 DIAGNOSIS — R2689 Other abnormalities of gait and mobility: Secondary | ICD-10-CM | POA: Diagnosis not present

## 2015-05-06 DIAGNOSIS — L989 Disorder of the skin and subcutaneous tissue, unspecified: Secondary | ICD-10-CM | POA: Diagnosis not present

## 2015-05-06 DIAGNOSIS — M6281 Muscle weakness (generalized): Secondary | ICD-10-CM | POA: Diagnosis not present

## 2015-05-06 DIAGNOSIS — J449 Chronic obstructive pulmonary disease, unspecified: Secondary | ICD-10-CM | POA: Diagnosis not present

## 2015-05-07 DIAGNOSIS — R2689 Other abnormalities of gait and mobility: Secondary | ICD-10-CM | POA: Diagnosis not present

## 2015-05-07 DIAGNOSIS — M6281 Muscle weakness (generalized): Secondary | ICD-10-CM | POA: Diagnosis not present

## 2015-05-07 DIAGNOSIS — J449 Chronic obstructive pulmonary disease, unspecified: Secondary | ICD-10-CM | POA: Diagnosis not present

## 2015-05-07 DIAGNOSIS — F0390 Unspecified dementia without behavioral disturbance: Secondary | ICD-10-CM | POA: Diagnosis not present

## 2015-05-07 DIAGNOSIS — L989 Disorder of the skin and subcutaneous tissue, unspecified: Secondary | ICD-10-CM | POA: Diagnosis not present

## 2015-05-07 DIAGNOSIS — I10 Essential (primary) hypertension: Secondary | ICD-10-CM | POA: Diagnosis not present

## 2015-05-13 DIAGNOSIS — R2689 Other abnormalities of gait and mobility: Secondary | ICD-10-CM | POA: Diagnosis not present

## 2015-05-13 DIAGNOSIS — J449 Chronic obstructive pulmonary disease, unspecified: Secondary | ICD-10-CM | POA: Diagnosis not present

## 2015-05-13 DIAGNOSIS — F0390 Unspecified dementia without behavioral disturbance: Secondary | ICD-10-CM | POA: Diagnosis not present

## 2015-05-13 DIAGNOSIS — L989 Disorder of the skin and subcutaneous tissue, unspecified: Secondary | ICD-10-CM | POA: Diagnosis not present

## 2015-05-13 DIAGNOSIS — I10 Essential (primary) hypertension: Secondary | ICD-10-CM | POA: Diagnosis not present

## 2015-05-13 DIAGNOSIS — M6281 Muscle weakness (generalized): Secondary | ICD-10-CM | POA: Diagnosis not present

## 2015-05-21 DIAGNOSIS — L989 Disorder of the skin and subcutaneous tissue, unspecified: Secondary | ICD-10-CM | POA: Diagnosis not present

## 2015-05-21 DIAGNOSIS — M6281 Muscle weakness (generalized): Secondary | ICD-10-CM | POA: Diagnosis not present

## 2015-05-21 DIAGNOSIS — F0390 Unspecified dementia without behavioral disturbance: Secondary | ICD-10-CM | POA: Diagnosis not present

## 2015-05-21 DIAGNOSIS — R2689 Other abnormalities of gait and mobility: Secondary | ICD-10-CM | POA: Diagnosis not present

## 2015-05-21 DIAGNOSIS — J449 Chronic obstructive pulmonary disease, unspecified: Secondary | ICD-10-CM | POA: Diagnosis not present

## 2015-05-21 DIAGNOSIS — I10 Essential (primary) hypertension: Secondary | ICD-10-CM | POA: Diagnosis not present

## 2015-06-11 DIAGNOSIS — H401134 Primary open-angle glaucoma, bilateral, indeterminate stage: Secondary | ICD-10-CM | POA: Diagnosis not present

## 2015-06-11 DIAGNOSIS — H21232 Degeneration of iris (pigmentary), left eye: Secondary | ICD-10-CM | POA: Diagnosis not present

## 2015-06-26 ENCOUNTER — Emergency Department: Payer: Medicare Other

## 2015-06-26 ENCOUNTER — Observation Stay
Admission: EM | Admit: 2015-06-26 | Discharge: 2015-06-30 | Disposition: A | Discharge status: Home / Self Care | Payer: Medicare Other | Attending: Specialist | Admitting: Specialist

## 2015-06-26 ENCOUNTER — Encounter: Payer: Self-pay | Admitting: Emergency Medicine

## 2015-06-26 DIAGNOSIS — K5730 Diverticulosis of large intestine without perforation or abscess without bleeding: Secondary | ICD-10-CM | POA: Diagnosis not present

## 2015-06-26 DIAGNOSIS — R000 Tachycardia, unspecified: Secondary | ICD-10-CM | POA: Insufficient documentation

## 2015-06-26 DIAGNOSIS — M8580 Other specified disorders of bone density and structure, unspecified site: Secondary | ICD-10-CM | POA: Insufficient documentation

## 2015-06-26 DIAGNOSIS — W19XXXA Unspecified fall, initial encounter: Secondary | ICD-10-CM | POA: Diagnosis not present

## 2015-06-26 DIAGNOSIS — Z86010 Personal history of colonic polyps: Secondary | ICD-10-CM | POA: Diagnosis not present

## 2015-06-26 DIAGNOSIS — Z87891 Personal history of nicotine dependence: Secondary | ICD-10-CM | POA: Diagnosis not present

## 2015-06-26 DIAGNOSIS — J9811 Atelectasis: Secondary | ICD-10-CM | POA: Insufficient documentation

## 2015-06-26 DIAGNOSIS — D696 Thrombocytopenia, unspecified: Secondary | ICD-10-CM | POA: Insufficient documentation

## 2015-06-26 DIAGNOSIS — K828 Other specified diseases of gallbladder: Secondary | ICD-10-CM | POA: Diagnosis not present

## 2015-06-26 DIAGNOSIS — M4856XA Collapsed vertebra, not elsewhere classified, lumbar region, initial encounter for fracture: Principal | ICD-10-CM | POA: Insufficient documentation

## 2015-06-26 DIAGNOSIS — Z7982 Long term (current) use of aspirin: Secondary | ICD-10-CM | POA: Insufficient documentation

## 2015-06-26 DIAGNOSIS — X58XXXA Exposure to other specified factors, initial encounter: Secondary | ICD-10-CM | POA: Insufficient documentation

## 2015-06-26 DIAGNOSIS — F0280 Dementia in other diseases classified elsewhere without behavioral disturbance: Secondary | ICD-10-CM | POA: Diagnosis not present

## 2015-06-26 DIAGNOSIS — G309 Alzheimer's disease, unspecified: Secondary | ICD-10-CM | POA: Diagnosis not present

## 2015-06-26 DIAGNOSIS — H409 Unspecified glaucoma: Secondary | ICD-10-CM | POA: Insufficient documentation

## 2015-06-26 DIAGNOSIS — I714 Abdominal aortic aneurysm, without rupture, unspecified: Secondary | ICD-10-CM

## 2015-06-26 DIAGNOSIS — Z86711 Personal history of pulmonary embolism: Secondary | ICD-10-CM | POA: Insufficient documentation

## 2015-06-26 DIAGNOSIS — M25551 Pain in right hip: Secondary | ICD-10-CM | POA: Insufficient documentation

## 2015-06-26 DIAGNOSIS — M25559 Pain in unspecified hip: Secondary | ICD-10-CM | POA: Diagnosis not present

## 2015-06-26 DIAGNOSIS — Z86718 Personal history of other venous thrombosis and embolism: Secondary | ICD-10-CM | POA: Diagnosis not present

## 2015-06-26 DIAGNOSIS — J439 Emphysema, unspecified: Secondary | ICD-10-CM

## 2015-06-26 DIAGNOSIS — M549 Dorsalgia, unspecified: Secondary | ICD-10-CM | POA: Diagnosis not present

## 2015-06-26 DIAGNOSIS — M5441 Lumbago with sciatica, right side: Secondary | ICD-10-CM | POA: Diagnosis not present

## 2015-06-26 DIAGNOSIS — N400 Enlarged prostate without lower urinary tract symptoms: Secondary | ICD-10-CM | POA: Diagnosis not present

## 2015-06-26 DIAGNOSIS — K5900 Constipation, unspecified: Secondary | ICD-10-CM | POA: Insufficient documentation

## 2015-06-26 DIAGNOSIS — Z881 Allergy status to other antibiotic agents status: Secondary | ICD-10-CM | POA: Diagnosis not present

## 2015-06-26 DIAGNOSIS — Z419 Encounter for procedure for purposes other than remedying health state, unspecified: Secondary | ICD-10-CM

## 2015-06-26 DIAGNOSIS — M160 Bilateral primary osteoarthritis of hip: Secondary | ICD-10-CM | POA: Diagnosis not present

## 2015-06-26 DIAGNOSIS — S32000A Wedge compression fracture of unspecified lumbar vertebra, initial encounter for closed fracture: Secondary | ICD-10-CM

## 2015-06-26 DIAGNOSIS — I10 Essential (primary) hypertension: Secondary | ICD-10-CM | POA: Insufficient documentation

## 2015-06-26 DIAGNOSIS — B029 Zoster without complications: Secondary | ICD-10-CM | POA: Insufficient documentation

## 2015-06-26 DIAGNOSIS — M5489 Other dorsalgia: Secondary | ICD-10-CM | POA: Diagnosis present

## 2015-06-26 DIAGNOSIS — Z85820 Personal history of malignant melanoma of skin: Secondary | ICD-10-CM | POA: Insufficient documentation

## 2015-06-26 DIAGNOSIS — Y939 Activity, unspecified: Secondary | ICD-10-CM | POA: Insufficient documentation

## 2015-06-26 DIAGNOSIS — Z79899 Other long term (current) drug therapy: Secondary | ICD-10-CM | POA: Diagnosis not present

## 2015-06-26 DIAGNOSIS — M545 Low back pain: Secondary | ICD-10-CM | POA: Diagnosis not present

## 2015-06-26 DIAGNOSIS — J449 Chronic obstructive pulmonary disease, unspecified: Secondary | ICD-10-CM | POA: Insufficient documentation

## 2015-06-26 DIAGNOSIS — S32020A Wedge compression fracture of second lumbar vertebra, initial encounter for closed fracture: Secondary | ICD-10-CM | POA: Diagnosis not present

## 2015-06-26 DIAGNOSIS — E785 Hyperlipidemia, unspecified: Secondary | ICD-10-CM | POA: Insufficient documentation

## 2015-06-26 DIAGNOSIS — I739 Peripheral vascular disease, unspecified: Secondary | ICD-10-CM | POA: Insufficient documentation

## 2015-06-26 DIAGNOSIS — L8990 Pressure ulcer of unspecified site, unspecified stage: Secondary | ICD-10-CM | POA: Diagnosis not present

## 2015-06-26 HISTORY — DX: Tachycardia, unspecified: R00.0

## 2015-06-26 LAB — COMPREHENSIVE METABOLIC PANEL
ALBUMIN: 3.8 g/dL (ref 3.5–5.0)
ALT: 24 U/L (ref 17–63)
AST: 26 U/L (ref 15–41)
Alkaline Phosphatase: 114 U/L (ref 38–126)
Anion gap: 7 (ref 5–15)
BUN: 15 mg/dL (ref 6–20)
CO2: 28 mmol/L (ref 22–32)
CREATININE: 0.71 mg/dL (ref 0.61–1.24)
Calcium: 8.8 mg/dL — ABNORMAL LOW (ref 8.9–10.3)
Chloride: 108 mmol/L (ref 101–111)
GFR calc Af Amer: >60 mL/min (ref >60)
GLUCOSE: 117 mg/dL — AB (ref 65–99)
Potassium: 4.5 mmol/L (ref 3.5–5.1)
Sodium: 143 mmol/L (ref 135–145)
Total Bilirubin: 1.1 mg/dL (ref 0.3–1.2)
Total Protein: 7.1 g/dL (ref 6.5–8.1)

## 2015-06-26 LAB — URINALYSIS COMPLETE WITH MICROSCOPIC (ARMC ONLY)
BACTERIA UA: NONE SEEN
Bilirubin Urine: NEGATIVE
Glucose, UA: NEGATIVE mg/dL
HGB URINE DIPSTICK: NEGATIVE
LEUKOCYTES UA: NEGATIVE
NITRITE: NEGATIVE
PH: 7 (ref 5.0–8.0)
PROTEIN: NEGATIVE mg/dL
SPECIFIC GRAVITY, URINE: 1.019 (ref 1.005–1.030)

## 2015-06-26 LAB — CBC WITH DIFFERENTIAL/PLATELET
BASOS ABS: 0 10*3/uL (ref 0–0.1)
BASOS PCT: 0 %
EOS PCT: 1 %
Eosinophils Absolute: 0.1 10*3/uL (ref 0–0.7)
HCT: 44 % (ref 40.0–52.0)
Hemoglobin: 14.3 g/dL (ref 13.0–18.0)
LYMPHS PCT: 16 %
Lymphs Abs: 1.3 10*3/uL (ref 1.0–3.6)
MCH: 30.4 pg (ref 26.0–34.0)
MCHC: 32.6 g/dL (ref 32.0–36.0)
MCV: 93.2 fL (ref 80.0–100.0)
Monocytes Absolute: 0.7 10*3/uL (ref 0.2–1.0)
Monocytes Relative: 8 %
NEUTROS ABS: 6.4 10*3/uL (ref 1.4–6.5)
Neutrophils Relative %: 75 %
PLATELETS: 102 10*3/uL — AB (ref 150–440)
RBC: 4.72 MIL/uL (ref 4.40–5.90)
RDW: 15.1 % — AB (ref 11.5–14.5)
WBC: 8.5 10*3/uL (ref 3.8–10.6)

## 2015-06-26 LAB — TROPONIN I

## 2015-06-26 LAB — LIPASE, BLOOD: Lipase: 20 U/L (ref 11–51)

## 2015-06-26 LAB — MRSA PCR SCREENING: MRSA by PCR: NEGATIVE

## 2015-06-26 MED ORDER — LATANOPROST 0.005 % OP SOLN
1.0000 [drp] | Freq: Every day | OPHTHALMIC | Status: DC
  Administered 2015-06-26: 1 [drp] via OPHTHALMIC
  Filled 2015-06-26: qty 2.5

## 2015-06-26 MED ORDER — IOHEXOL 240 MG/ML SOLN
25.0000 mL | Freq: Once | INTRAMUSCULAR | Status: AC | PRN
  Administered 2015-06-26: 25 mL via ORAL

## 2015-06-26 MED ORDER — FENTANYL CITRATE (PF) 100 MCG/2ML IJ SOLN
50.0000 ug | Freq: Once | INTRAMUSCULAR | Status: AC
  Administered 2015-06-26: 50 ug via INTRAVENOUS
  Filled 2015-06-26: qty 2

## 2015-06-26 MED ORDER — HYDROMORPHONE HCL 1 MG/ML IJ SOLN
1.0000 mg | INTRAMUSCULAR | Status: DC | PRN
  Administered 2015-06-26 (×2): 1 mg via INTRAVENOUS
  Filled 2015-06-26 (×2): qty 1

## 2015-06-26 MED ORDER — ACETAMINOPHEN 325 MG PO TABS
650.0000 mg | ORAL_TABLET | Freq: Once | ORAL | Status: AC
  Administered 2015-06-26: 650 mg via ORAL
  Filled 2015-06-26: qty 2

## 2015-06-26 MED ORDER — OXYCODONE HCL 5 MG PO TABS
5.0000 mg | ORAL_TABLET | ORAL | Status: DC | PRN
Start: 2015-06-26 — End: 2015-06-30
  Administered 2015-06-27: 5 mg via ORAL
  Filled 2015-06-26: qty 1

## 2015-06-26 MED ORDER — DONEPEZIL HCL 5 MG PO TABS
10.0000 mg | ORAL_TABLET | Freq: Every day | ORAL | Status: DC
  Administered 2015-06-26 – 2015-06-29 (×4): 10 mg via ORAL
  Filled 2015-06-26 (×4): qty 2

## 2015-06-26 MED ORDER — MEMANTINE HCL 10 MG PO TABS
10.0000 mg | ORAL_TABLET | Freq: Two times a day (BID) | ORAL | Status: DC
Start: 2015-06-26 — End: 2015-06-30
  Administered 2015-06-26 – 2015-06-30 (×7): 10 mg via ORAL
  Filled 2015-06-26 (×8): qty 1

## 2015-06-26 MED ORDER — PRAVASTATIN SODIUM 20 MG PO TABS
20.0000 mg | ORAL_TABLET | Freq: Every day | ORAL | Status: DC
  Administered 2015-06-27 – 2015-06-28 (×2): 20 mg via ORAL
  Filled 2015-06-26 (×2): qty 1

## 2015-06-26 MED ORDER — IOHEXOL 300 MG/ML  SOLN
100.0000 mL | Freq: Once | INTRAMUSCULAR | Status: AC | PRN
  Administered 2015-06-26: 100 mL via INTRAVENOUS

## 2015-06-26 MED ORDER — GUAIFENESIN-DM 100-10 MG/5ML PO SYRP: 5.0000 mL | ORAL_SOLUTION | ORAL | Status: DC | PRN

## 2015-06-26 MED ORDER — ENOXAPARIN SODIUM 40 MG/0.4ML ~~LOC~~ SOLN
40.0000 mg | SUBCUTANEOUS | Status: DC
  Administered 2015-06-26 – 2015-06-27 (×2): 40 mg via SUBCUTANEOUS
  Filled 2015-06-26 (×2): qty 0.4

## 2015-06-26 MED ORDER — BUDESONIDE-FORMOTEROL FUMARATE 160-4.5 MCG/ACT IN AERO
2.0000 | INHALATION_SPRAY | Freq: Two times a day (BID) | RESPIRATORY_TRACT | Status: DC
  Administered 2015-06-26 – 2015-06-30 (×8): 2 via RESPIRATORY_TRACT
  Filled 2015-06-26: qty 6

## 2015-06-26 MED ORDER — ACETAMINOPHEN 325 MG PO TABS
650.0000 mg | ORAL_TABLET | ORAL | Status: DC | PRN
  Administered 2015-06-28 – 2015-06-30 (×4): 650 mg via ORAL
  Filled 2015-06-26 (×4): qty 2

## 2015-06-26 MED ORDER — SODIUM CHLORIDE 0.9 % IV BOLUS (SEPSIS)
500.0000 mL | Freq: Once | INTRAVENOUS | Status: AC
  Administered 2015-06-26: 500 mL via INTRAVENOUS

## 2015-06-26 NOTE — ED Notes (Signed)
Pt arrived by EMS from [facility] w/ c/o of right hip pain. Patient normally ambulates w/o difficulty and was unable to get up this morning. Staff say that pt has not reported falling and there is no bruising, swelling or abnormal appearance.

## 2015-06-26 NOTE — ED Notes (Signed)
Patient transported to X-ray 

## 2015-06-26 NOTE — H&P (Signed)
[place] [facility] [place] NAME: Adam Singleton    MR#:  [id number]  DATE OF BIRTH:  1929-01-10  DATE OF ADMISSION:  06/26/2015  PRIMARY CARE PHYSICIAN: Lelon Huh, MD   REQUESTING/REFERRING PHYSICIAN:   CHIEF COMPLAINT:   Chief Complaint  Patient presents with  . Hip Pain    HISTORY OF PRESENT ILLNESS: Adam Singleton  is a 82 y.o. male with a known history of COPD, dementia, hypertension who presents to the hospital with 1 day history of back pains. Apparently patient was doing well yesterday. However, today in the morning. He will refuse to get out from bed and complained of right hip pain. Upon further investigation, it appeared that pain was in the lower back. The patient had a CT scan of his abdomen and pelvis done which revealed L2 compression fracture, considered to be acute or subacute. Patient was given 50 micrograms of fentanyl. However, his pain did not improve significantly and hospitalist services were contacted for admission. Labs are unremarkable except for thrombocytopenia. CT scan also showed porcelain gallbladder and AAA.  Patient is not able to provide any review of systems due to advanced dementia.  PAST MEDICAL HISTORY:   Past Medical History  Diagnosis Date  . Colon polyps   . Clotting disorder (HCC)     Thrombocytopenia  . Hyperlipidemia   . Alzheimer's disease 12/09/2009  . Hypertension   . COPD (chronic obstructive pulmonary disease) ([place])   . Cancer ([place])     malignant melanoma  . Tachycardia     PAST SURGICAL HISTORY: Past Surgical History  Procedure Laterality Date  . Mohs surgery  12/07/2009    Moh's micrographic surgery for skin cancer    SOCIAL HISTORY:  Social History  Substance Use Topics  . Smoking status: Former Smoker -- 1.00 packs/day for 40 years    Types: Cigarettes    Quit date: 05/16/1988  . Smokeless tobacco: Not on file  . Alcohol Use: No    FAMILY HISTORY:  Family History  Problem  Relation Age of Onset  . Family history unknown: Yes    DRUG ALLERGIES:  Allergies  Allergen Reactions  . Vancomycin Hcl Rash    Review of Systems  Unable to perform ROS: dementia    MEDICATIONS AT HOME:  Prior to Admission medications   Medication Sig Start Date End Date Taking? Authorizing Provider  acetaminophen (TYLENOL) 325 MG tablet Take 2 tablets by mouth every 4 (four) hours as needed. for mild pain or fever   Yes Historical Provider, MD  aspirin 81 MG EC tablet Take 1 tablet by mouth daily. 03/21/13  Yes Historical Provider, MD  budesonide-formoterol (SYMBICORT) 160-4.5 MCG/ACT inhaler Inhale 2 puffs into the lungs 2 (two) times daily. For chronic obstructive pulmonary 07/31/13  Yes Historical Provider, MD  Dextromethorphan-Guaifenesin 10-100 MG/5ML liquid Take 5 mLs by mouth every 4 (four) hours as needed. For cough   Yes Historical Provider, MD  donepezil (ARICEPT) 10 MG tablet Take 1 tablet by mouth at bedtime. 01/04/12  Yes Historical Provider, MD  lovastatin (MEVACOR) 20 MG tablet Take 1 tablet by mouth daily. 10/12/11  Yes Historical Provider, MD  memantine (NAMENDA) 10 MG tablet Take 1 tablet by mouth 2 (two) times daily.   Yes Historical Provider, MD  Travoprost, BAK Free, (TRAVATAN) 0.004 % SOLN ophthalmic solution Apply 1 drop to eye at bedtime. 12/09/09  Yes Historical Provider, MD      PHYSICAL EXAMINATION:   VITAL  SIGNS: Blood pressure 158/73, pulse 56, resp. rate 14, SpO2 100 %.  GENERAL:  82 y.o.-year-old patient lying in the bed on this back in no significant distress. Patient becomes markedly uncomfortable if he is trying to move from side to side, he refuses to allow being crawled from one side to the other side.   EYES: Pupils equal, round, reactive to light and accommodation. No scleral icterus. Extraocular muscles intact.  HEENT: Head atraumatic, normocephalic. Oropharynx and nasopharynx clear.  NECK:  Supple, no jugular venous distention. No thyroid  enlargement, no tenderness.  LUNGS: Normal breath sounds bilaterally, no wheezing, rales,rhonchi or crepitation. No use of accessory muscles of respiration.  CARDIOVASCULAR: S1, S2 normal. No murmurs, rubs, or gallops.  ABDOMEN: Soft, nontender, nondistended. Bowel sounds present. No organomegaly or mass.  EXTREMITIES: No pedal edema, cyanosis, or clubbing.  NEUROLOGIC: Cranial nerves II through XII are intact. Muscle strength 5/5 in all extremities. Sensation intact. Gait not checked. Straight leg raise was unremarkable, however , rolling patient from his back to the side was resisted and impossible to accomplish by examiner PSYCHIATRIC: The patient is alert , disoriented, does not follow commands, minimally verbal, able to say yes to all questions.   SKIN: No obvious rash, lesion, or ulcer.   LABORATORY PANEL:   CBC  Recent Labs Lab 06/26/15 1310  WBC 8.5  HGB 14.3  HCT 44.0  PLT 102*  MCV 93.2  MCH 30.4  MCHC 32.6  RDW 15.1*  LYMPHSABS 1.3  MONOABS 0.7  EOSABS 0.1  BASOSABS 0.0   ------------------------------------------------------------------------------------------------------------------  Chemistries   Recent Labs Lab 06/26/15 1310  NA 143  K 4.5  CL 108  CO2 28  GLUCOSE 117*  BUN 15  CREATININE 0.71  CALCIUM 8.8*  AST 26  ALT 24  ALKPHOS 114  BILITOT 1.1   ------------------------------------------------------------------------------------------------------------------  Cardiac Enzymes  Recent Labs Lab 06/26/15 1310  TROPONINI <0.03   ------------------------------------------------------------------------------------------------------------------  RADIOLOGY: Ct Abdomen Pelvis W Contrast  06/26/2015  CLINICAL DATA:  Right hip pain. Dementia. The patient was unable to stand this morning. EXAM: CT ABDOMEN AND PELVIS WITH CONTRAST TECHNIQUE: Multidetector CT imaging of the abdomen and pelvis was performed using the standard protocol following bolus  administration of intravenous contrast. CONTRAST:  178mL OMNIPAQUE IOHEXOL 300 MG/ML  SOLN COMPARISON:  06/26/2015 and 07/13/2009 FINDINGS: Lower chest: Emphysema. Scarring laterally in the right lower lobe. Scarring or atelectasis posteriorly in the left lower lobe. Calcification of the mitral and aortic valves. We image the bottom of a vague density in the subcutaneous tissues of the right anterior chest, 1.2 by 0.9 cm, previously in this vicinity there is a tiny 5 mm lymph node or similar structure. Hepatobiliary: There is progressive calcification of the gallbladder wall favoring porcelain gallbladder, increased from 2011. No obvious soft tissue mass. Common bile duct 9 mm in diameter, borderline prominent for age. Previously the CBD was 8 mm in diameter. Pancreas: Unremarkable Spleen: Unremarkable Adrenals/Urinary Tract: Unremarkable Stomach/Bowel: Sigmoid diverticulosis without active diverticulitis. Vascular/Lymphatic: Aortoiliac atherosclerotic vascular disease. Partially thrombosed infrarenal abdominal aortic aneurysm, 3.9 cm in anterior- posterior dimension, formerly 3.4 cm. No pathologic adenopathy identified. Reproductive: Mildly prominent prostate gland. Other: No supplemental non-categorized findings. Musculoskeletal: Suspected left inguinal hernia containing adipose tissue. Subtle superior endplate compression fracture at L2, uncertain chronicity, no definite paraspinal edema, no significant degree of bony retropulsion. There is a band of sclerosis below the superior endplate. There is degenerative arthropathy of both hips with axial loss of articular space, but no  well-defined hip fracture. I do not observe a bony pelvic fracture. I do not see a significant hip effusion. IMPRESSION: 1. Subtle superior endplate compression fracture at L2, probably subacute or possibly acute. There is a sclerotic subcortical band along the superior endplate, but I do not observe significant bony retropulsion or  significant paraspinal edema. There is only 5-10% compression currently. Correlate with point tenderness posteriorly over the L2 level. 2. 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 No current soft tissue mass. This condition can predispose to gallbladder carcinoma. Cholecystectomy is often favored, although the patient's age and comorbidities may factor into this decision. 3. I do not observe a hip or pelvic fracture. MRI does have higher sensitivity for subtle fractures of the head. 4. Emphysema. 5. Calcified mitral and aortic valves. 6. Sigmoid diverticulosis. 7. Infrarenal abdominal aortic aneurysm, 3.9 cm in the anterior- posterior dimension, previously 3.4 cm on 07/13/2009. Recommend followup by ultrasound in 2 years. This recommendation follows ACR consensus guidelines: White Paper of the ACR Incidental Findings Committee II on Vascular Findings. J Am Coll Radiol 2013; 10:789-794. Electronically Signed   By: Van Clines M.D.   On: 06/26/2015 15:25   Dg Hip Unilat With Pelvis 2-3 Views Right  06/26/2015  CLINICAL DATA:  Right hip pain this morning, no known injury EXAM: DG HIP (WITH OR WITHOUT PELVIS) 2-3V RIGHT COMPARISON:  None. FINDINGS: Diffuse osteopenia. Pelvic bones intact. Proximal femur shows no fracture or dislocation.Extensive calcification of the iliac and femoral arteries. IMPRESSION: No acute findings. Electronically Signed   By: Skipper Cliche M.D.   On: 06/26/2015 11:33    EKG: Orders placed or performed during the hospital encounter of 06/26/15  . ED EKG  . ED EKG  . EKG 12-Lead  . EKG 12-Lead    IMPRESSION AND PLAN:  Active Problems:   Compression fracture of L2 (HCC)   Back pain   Porcelain gallbladder   Emphysema lung (HCC)   Abdominal aortic aneurysm (AAA) ([place])  #1. Back pain likely due to acute L2 compression fracture, admitted to a medical floor, continue him on intravenous and oral pain medications, get orthopedic  surgeon involved for further recommendations, Possible kyphoplasty. Discussed this patient's son and granddaughter, #2 porcelain gallbladder, patient needs to be seen by general surgeon for cholecystectomy as he porcelain gallbladder increases risk for gallbladder cancer formation ,   #3. AAA , needs to be followed in 2 years , we would initiate patient on labetalol. His blood pressure is elevated after pain control is achieved   #4, thrombocytopenia, etiology is unclear, follow in the morning #4 chronic respiratory failure due to COPD, continue oxygen therapy as needed, Symbicort #6. Dementia continue Aricept #7. Hyperlipidemia. Continue on Mevacor #8. Glaucoma, continue Travatan   All the records are reviewed and case discussed with ED provider. Management plans discussed with the patient, family and they are in agreement.  CODE STATUS: Code Status History    This patient does not have a recorded code status. Please follow your organizational policy for patients in this situation.       TOTAL TIME TAKING CARE OF THIS PATIENT: 50 minutes . Discussed this patient's family     Tyress Loden M.D on 06/26/2015 at 4:27 PM  Between 7am to 6pm - Pager - [phone] After 6pm go to [email or web address] - password [email or web address] [facility]  Office  [phone]  CC: Primary care physician; Lelon Huh, MD

## 2015-06-26 NOTE — ED Provider Notes (Signed)
[facility] Emergency Department Provider Note  ____________________________________________  Time seen: Approximately 11:05 AM  I have reviewed the triage vital signs and the nursing notes.   HISTORY  Chief Complaint Hip Pain  History of present illness and review of systems is limited due to the patient's dementia. Information is obtained from his son at bedside and EMS on their arrival.  HPI PIETER BACHAND is a 82 y.o. male with history of Alzheimer's disease, hyperlipidemia, hypertension, COPD since for evaluation of atraumatic right hip pain today. According to EMS and the patient's son, he is normally ambulatory however this morning he was not able to get up from bed because he was complaining of right hip pain. No recent falls or injury. He has otherwise been in his usual state of health without illness. No vomiting, diarrhea, fevers or chills.   Past Medical History  Diagnosis Date  . Colon polyps   . Clotting disorder (HCC)     Thrombocytopenia  . Hyperlipidemia   . Alzheimer's disease 12/09/2009  . Hypertension   . COPD (chronic obstructive pulmonary disease) ([place])   . Cancer ([place])     malignant melanoma  . Tachycardia     Patient Active Problem List   Diagnosis Date Noted  . Hyperglycemia 11/05/2014  . History of DVT (deep vein thrombosis) 11/05/2014  . 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 Personal history of PE (pulmonary embolism) 11/05/2014  . Obstruction to urinary outflow 11/05/2014  . History of colonic polyps 09/24/2014  . COPD (chronic obstructive pulmonary disease) ([place]) 09/24/2014  . H/O cardiovascular disorder 09/24/2014  . H/O Malignant melanoma 09/24/2014  . Prostate disease 09/24/2014  . Thrombocytopenia ([place]) 09/24/2014  . Alzheimer's dementia 12/09/2009  . Hyperlipidemia 12/09/2009  . Malignant neoplasm of scalp and skin of neck 11/09/2009    Past Surgical History  Procedure Laterality Date  . Mohs surgery  12/07/2009     Moh's micrographic surgery for skin cancer    Current Outpatient Rx  Name  Route  Sig  Dispense  Refill  . acetaminophen (TYLENOL) 325 MG tablet   Oral   Take 2 tablets by mouth every 4 (four) hours as needed. for mild pain or fever         . aspirin 81 MG EC tablet   Oral   Take 1 tablet by mouth daily.         . budesonide-formoterol (SYMBICORT) 160-4.5 MCG/ACT inhaler   Inhalation   Inhale 2 puffs into the lungs 2 (two) times daily. For chronic obstructive pulmonary         . Dextromethorphan-Guaifenesin 10-100 MG/5ML liquid   Oral   Take 5 mLs by mouth every 4 (four) hours as needed. For cough         . donepezil (ARICEPT) 10 MG tablet   Oral   Take 1 tablet by mouth at bedtime.         . lovastatin (MEVACOR) 20 MG tablet   Oral   Take 1 tablet by mouth daily.         . memantine (NAMENDA) 10 MG tablet   Oral   Take 1 tablet by mouth 2 (two) times daily.         . Travoprost, BAK Free, (TRAVATAN) 0.004 % SOLN ophthalmic solution   Ophthalmic   Apply 1 drop to eye at bedtime.           Allergies Vancomycin hcl  Family History  Problem Relation Age of Onset  .  Family history unknown: Yes    Social History Social History  Substance Use Topics  . Smoking status: Former Smoker -- 1.00 packs/day for 40 years    Types: Cigarettes    Quit date: 05/16/1988  . Smokeless tobacco: None  . Alcohol Use: No    Review of Systems  Constitutional: No fever/chills Gastrointestinal: No  vomiting.  No diarrhea.     History of present illness and review of systems is limited due to the patient's dementia. Information is obtained from his son at bedside and EMS on their arrival. ____________________________________________   PHYSICAL EXAM:  VITAL SIGNS: ED Triage Vitals  Enc Vitals Group     BP 06/26/15 1040 158/73 mmHg     Pulse Rate 06/26/15 1040 56     Resp 06/26/15 1040 14     Temp --      Temp src --      SpO2 06/26/15 1037 95 %      Weight --      Height --      Head Cir --      Peak Flow --      Pain Score --      Pain Loc --      Pain Edu? --      Excl. in [place]? --     Constitutional: Alert and oriented to self only, not to place or time which is his baseline according to his son who is at bedside. Does not follow commands or cooperate with the examination. Eyes: Conjunctivae are normal. PERRL. EOMI. Head: Atraumatic. Nose: No congestion/rhinnorhea. Mouth/Throat: Mucous membranes are dry.  Oropharynx non-erythematous. Neck: No stridor. Supple without meningismus. Cardiovascular: Normal rate, regular rhythm. Grossly normal heart sounds.  Good peripheral circulation. Respiratory: Normal respiratory effort.  No retractions. Lungs CTAB. Gastrointestinal: Appears mildly distended with some guarding, no rebound. Genitourinary: deferred Musculoskeletal: No lower extremity tenderness nor edema.  No joint effusions. The patient has full painless passive range of motion at the right hip and the left hip. He has no midline tenderness to palpation throughout the T or L-spine however when you attempt to sit him forward he complains of severe pain. Neurologic:  Normal speech and language. Appears to move all extremities symmetrically/equally but does not cooperate with formal neurological testing. Skin:  Skin is warm, dry and intact. No rash noted.   ____________________________________________   LABS (all labs ordered are listed, but only abnormal results are displayed)  Labs Reviewed  CBC WITH DIFFERENTIAL/PLATELET - Abnormal; Notable for the following:    RDW 15.1 (*)    Platelets 102 (*)    All other components within normal limits  COMPREHENSIVE METABOLIC PANEL - Abnormal; Notable for the following:    Glucose, Bld 117 (*)    Calcium 8.8 (*)    All other components within normal limits  URINALYSIS COMPLETEWITH MICROSCOPIC ([facility] ONLY) - Abnormal; Notable for the following:    Color, Urine YELLOW (*)    APPearance  HAZY (*)    Ketones, ur 1+ (*)    Squamous Epithelial / LPF 0-5 (*)    All other components within normal limits  TROPONIN I  LIPASE, BLOOD   ____________________________________________  EKG  ED ECG REPORT I, Joanne Gavel, the attending physician, personally viewed and interpreted this ECG.   Date: 06/26/2015  EKG Time:15:33  Rate: 65  Rhythm: normal sinus rhythm with sinus arrhythmia  Axis: right  Intervals:none  ST&T Change: No acute ST elevation  ____________________________________________  RADIOLOGY  Xray right hip IMPRESSION: No acute findings.  CT abdomen and pelvis IMPRESSION: 1. Subtle superior endplate compression fracture at L2, probably subacute or possibly acute. There is a sclerotic subcortical band along the superior endplate, but I do not observe significant bony retropulsion or significant paraspinal edema. There is only 5-10% compression currently. Correlate with point tenderness posteriorly over the L2 level. 2. 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 No current soft tissue mass. This condition can predispose to gallbladder carcinoma. Cholecystectomy is often favored, although the patient's age and comorbidities may factor into this decision. 3. I do not observe a hip or pelvic fracture. MRI does have higher sensitivity for subtle fractures of the head. 4. Emphysema. 5. Calcified mitral and aortic valves. 6. Sigmoid diverticulosis. 7. Infrarenal abdominal aortic aneurysm, 3.9 cm in the anterior- posterior dimension, previously 3.4 cm on 07/13/2009. Recommend followup by ultrasound in 2 years. This recommendation follows ACR consensus guidelines: White Paper of the ACR Incidental Findings Committee II on Vascular Findings. J Am Coll Radiol 2013; 10:789-794.  ____________________________________________   PROCEDURES  Procedure(s) performed: None  Critical Care performed:  No  ____________________________________________   INITIAL IMPRESSION / ASSESSMENT AND PLAN / ED COURSE  Pertinent labs & imaging results that were available during my care of the patient were reviewed by me and considered in my medical decision making (see chart for details).  KAREAM KISIEL is a 82 y.o. male with history of Alzheimer's disease, hyperlipidemia, hypertension, COPD since for evaluation of atraumatic right hip pain today. On exam, he is pleasantly demented in no acute distress. His vital signs are stable. He does not appear to have any tenderness or limited range of motion in either hip joint. Plain films of the right hip are negative for fracture. He does have some mild abdominal distention and given his complaint of lower back pain, we'll obtain basic labs, urinalysis as well as CT of the abdomen and pelvis to evaluate for any intra-abdominal pathology or acute lumbar fracture. We'll treat his pain. Reassess for disposition.  ----------------------------------------- 3:43 PM on 06/26/2015 ----------------------------------------- Labs reviewed and are generally unremarkable. EKG sinus consistent with ischemia. CT scan is concerning for likely acute L2 compression fracture which is the most likely cause of his back pain. He still refuses to ambulate. I discussed the case with the hospitalist for admission.  ____________________________________________   FINAL CLINICAL IMPRESSION(S) / ED DIAGNOSES  Final diagnoses:  Hip pain, acute, right  Midline back pain, unspecified location  Lumbar compression fracture, closed, initial encounter (HCC)      Joanne Gavel, MD 06/26/15 1544

## 2015-06-27 ENCOUNTER — Inpatient Hospital Stay: Payer: Medicare Other

## 2015-06-27 DIAGNOSIS — M25551 Pain in right hip: Secondary | ICD-10-CM | POA: Diagnosis not present

## 2015-06-27 DIAGNOSIS — D696 Thrombocytopenia, unspecified: Secondary | ICD-10-CM | POA: Diagnosis not present

## 2015-06-27 DIAGNOSIS — M5441 Lumbago with sciatica, right side: Secondary | ICD-10-CM | POA: Diagnosis not present

## 2015-06-27 DIAGNOSIS — L8990 Pressure ulcer of unspecified site, unspecified stage: Secondary | ICD-10-CM | POA: Insufficient documentation

## 2015-06-27 DIAGNOSIS — J9611 Chronic respiratory failure with hypoxia: Secondary | ICD-10-CM | POA: Diagnosis not present

## 2015-06-27 DIAGNOSIS — S32020A Wedge compression fracture of second lumbar vertebra, initial encounter for closed fracture: Secondary | ICD-10-CM | POA: Diagnosis not present

## 2015-06-27 DIAGNOSIS — M4856XA Collapsed vertebra, not elsewhere classified, lumbar region, initial encounter for fracture: Secondary | ICD-10-CM | POA: Diagnosis not present

## 2015-06-27 LAB — CBC
HEMATOCRIT: 39.7 % — AB (ref 40.0–52.0)
Hemoglobin: 13.1 g/dL (ref 13.0–18.0)
MCH: 30.7 pg (ref 26.0–34.0)
MCHC: 33 g/dL (ref 32.0–36.0)
MCV: 92.9 fL (ref 80.0–100.0)
PLATELETS: 94 10*3/uL — AB (ref 150–440)
RBC: 4.27 MIL/uL — ABNORMAL LOW (ref 4.40–5.90)
RDW: 14.7 % — AB (ref 11.5–14.5)
WBC: 7.1 10*3/uL (ref 3.8–10.6)

## 2015-06-27 LAB — BASIC METABOLIC PANEL
Anion gap: 6 (ref 5–15)
BUN: 13 mg/dL (ref 6–20)
CALCIUM: 8.4 mg/dL — AB (ref 8.9–10.3)
CO2: 27 mmol/L (ref 22–32)
CREATININE: 0.72 mg/dL (ref 0.61–1.24)
Chloride: 110 mmol/L (ref 101–111)
GFR calc Af Amer: >60 mL/min (ref >60)
GLUCOSE: 105 mg/dL — AB (ref 65–99)
Potassium: 4 mmol/L (ref 3.5–5.1)
SODIUM: 143 mmol/L (ref 135–145)

## 2015-06-27 NOTE — Care Management Obs Status (Signed)
[facility] NOTIFICATION   Patient Details  Name: GRAHAME PEREZMARTINEZ MRN: [id number] Date of Birth: 04/12/1929   Medicare Observation Status Notification Given:  Yes (patient with advanced dementia/ no family present)    Ival Bible, RN 06/27/2015, 6:39 PM

## 2015-06-27 NOTE — Consult Note (Signed)
ORTHOPAEDIC CONSULTATION  REQUESTING PHYSICIAN: Henreitta Leber, MD  Chief Complaint:   Lower back and right hip pain.  History of Present Illness: Adam Singleton is a 82 y.o. male with a history of dementia, hypertension, COPD, and malignant melanoma who resides in an assisted living facility. Apparently, the patient began to complain of increased lower back pain and right hip pain yesterday. He was brought to the emergency room where x-rays and subsequent CT scan demonstrated a minimally impacted compression fracture of L2. The patient was admitted for further evaluation and treatment. The patient is a poor historian, but there is no history of falls or other injury that may have precipitated the onset of his symptoms.  Past Medical History  Diagnosis Date  . Colon polyps   . Clotting disorder (HCC)     Thrombocytopenia  . Hyperlipidemia   . Alzheimer's disease 12/09/2009  . Hypertension   . COPD (chronic obstructive pulmonary disease) ([place])   . Cancer ([place])     malignant melanoma  . Tachycardia    Past Surgical History  Procedure Laterality Date  . Mohs surgery  12/07/2009    Moh's micrographic surgery for skin cancer   Social History   Social History  . Marital Status: Widowed    Spouse Name: N/A  . Number of Children: N/A  . Years of Education: N/A   Occupational History  . retired    Social History Main Topics  . Smoking status: Former Smoker -- 1.00 packs/day for 40 years    Types: Cigarettes    Quit date: 05/16/1988  . Smokeless tobacco: None  . Alcohol Use: No  . Drug Use: No  . Sexual Activity: Not Asked   Other Topics Concern  . None   Social History Narrative   Family History  Problem Relation Age of Onset  . Family history unknown: Yes   Allergies  Allergen Reactions  . Vancomycin Hcl Rash   Prior to Admission medications   Medication Sig Start Date End Date Taking? Authorizing  Provider  acetaminophen (TYLENOL) 325 MG tablet Take 2 tablets by mouth every 4 (four) hours as needed. for mild pain or fever   Yes Historical Provider, MD  aspirin 81 MG EC tablet Take 1 tablet by mouth daily. 03/21/13  Yes Historical Provider, MD  budesonide-formoterol (SYMBICORT) 160-4.5 MCG/ACT inhaler Inhale 2 puffs into the lungs 2 (two) times daily. For chronic obstructive pulmonary 07/31/13  Yes Historical Provider, MD  Dextromethorphan-Guaifenesin 10-100 MG/5ML liquid Take 5 mLs by mouth every 4 (four) hours as needed. For cough   Yes Historical Provider, MD  donepezil (ARICEPT) 10 MG tablet Take 1 tablet by mouth at bedtime. 01/04/12  Yes Historical Provider, MD  lovastatin (MEVACOR) 20 MG tablet Take 1 tablet by mouth daily. 10/12/11  Yes Historical Provider, MD  memantine (NAMENDA) 10 MG tablet Take 1 tablet by mouth 2 (two) times daily.   Yes Historical Provider, MD  Travoprost, BAK Free, (TRAVATAN) 0.004 % SOLN ophthalmic solution Apply 1 drop to eye at bedtime. 12/09/09  Yes Historical Provider, MD   Ct Abdomen Pelvis W Contrast  06/26/2015  CLINICAL DATA:  Right hip pain. Dementia. The patient was unable to stand this morning. EXAM: CT ABDOMEN AND PELVIS WITH CONTRAST TECHNIQUE: Multidetector CT imaging of the abdomen and pelvis was performed using the standard protocol following bolus administration of intravenous contrast. CONTRAST:  147mL OMNIPAQUE IOHEXOL 300 MG/ML  SOLN COMPARISON:  06/26/2015 and 07/13/2009 FINDINGS: Lower chest: Emphysema. Scarring laterally  in the right lower lobe. Scarring or atelectasis posteriorly in the left lower lobe. Calcification of the mitral and aortic valves. We image the bottom of a vague density in the subcutaneous tissues of the right anterior chest, 1.2 by 0.9 cm, previously in this vicinity there is a tiny 5 mm lymph node or similar structure. Hepatobiliary: There is progressive calcification of the gallbladder wall favoring porcelain gallbladder,  increased from 2011. No obvious soft tissue mass. Common bile duct 9 mm in diameter, borderline prominent for age. Previously the CBD was 8 mm in diameter. Pancreas: Unremarkable Spleen: Unremarkable Adrenals/Urinary Tract: Unremarkable Stomach/Bowel: Sigmoid diverticulosis without active diverticulitis. Vascular/Lymphatic: Aortoiliac atherosclerotic vascular disease. Partially thrombosed infrarenal abdominal aortic aneurysm, 3.9 cm in anterior- posterior dimension, formerly 3.4 cm. No pathologic adenopathy identified. Reproductive: Mildly prominent prostate gland. Other: No supplemental non-categorized findings. Musculoskeletal: Suspected left inguinal hernia containing adipose tissue. Subtle superior endplate compression fracture at L2, uncertain chronicity, no definite paraspinal edema, no significant degree of bony retropulsion. There is a band of sclerosis below the superior endplate. There is degenerative arthropathy of both hips with axial loss of articular space, but no well-defined hip fracture. I do not observe a bony pelvic fracture. I do not see a significant hip effusion. IMPRESSION: 1. Subtle superior endplate compression fracture at L2, probably subacute or possibly acute. There is a sclerotic subcortical band along the superior endplate, but I do not observe significant bony retropulsion or significant paraspinal edema. There is only 5-10% compression currently. Correlate with point tenderness posteriorly over the L2 level. 2. 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 No current soft tissue mass. This condition can predispose to gallbladder carcinoma. Cholecystectomy is often favored, although the patient's age and comorbidities may factor into this decision. 3. I do not observe a hip or pelvic fracture. MRI does have higher sensitivity for subtle fractures of the head. 4. Emphysema. 5. Calcified mitral and aortic valves. 6. Sigmoid diverticulosis. 7.  Infrarenal abdominal aortic aneurysm, 3.9 cm in the anterior- posterior dimension, previously 3.4 cm on 07/13/2009. Recommend followup by ultrasound in 2 years. This recommendation follows ACR consensus guidelines: White Paper of the ACR Incidental Findings Committee II on Vascular Findings. J Am Coll Radiol 2013; 10:789-794. Electronically Signed   By: Van Clines M.D.   On: 06/26/2015 15:25   Dg Hip Unilat With Pelvis 2-3 Views Right  06/26/2015  CLINICAL DATA:  Right hip pain this morning, no known injury EXAM: DG HIP (WITH OR WITHOUT PELVIS) 2-3V RIGHT COMPARISON:  None. FINDINGS: Diffuse osteopenia. Pelvic bones intact. Proximal femur shows no fracture or dislocation.Extensive calcification of the iliac and femoral arteries. IMPRESSION: No acute findings. Electronically Signed   By: Skipper Cliche M.D.   On: 06/26/2015 11:33    Positive ROS: All other systems have been reviewed and were otherwise negative with the exception of those mentioned in the HPI and as above.  Physical Exam: General:  Alert, no acute distress Psychiatric:  Patient is not competent for consent, but exhibits normal mood and affect   Cardiovascular:  No pedal edema Respiratory:  No wheezing, non-labored breathing GI:  Abdomen is soft and non-tender Skin:  No lesions in the area of chief complaint Neurologic:  Sensation intact distally Lymphatic:  No axillary or cervical lymphadenopathy  Orthopedic Exam:  Orthopedic examination is limited to the lower back and right lower extremity. The patient does have tenderness to palpation over the upper lumbar spine region, especially in the area of L2. Skin  inspection of the lower back region is unremarkable. Examination of his right hip demonstrates no skin abnormalities. He has no tenderness to palpation around the right hip region, nor with logrolling of the right hip. However, he does have reproduction of right hip pain with hip flexion beyond 45, as well as with  gentle internal and external rotation of the hip in 45 of flexion. He is able to actively dorsiflex and plantarflex his toes and ankle on the right. Sensation is intact to light touch to all distributions on the right. He has good capillary refill to his right foot.  X-rays:  X-rays of the lumbar spine, as well as a CT scan of the abdomen and pelvis are available for review. The findings are as described above. These films do demonstrate a normally impacted superior endplate compression fracture at L2, but no evidence for any pelvic or right hip fractures.   Assessment: Lower back and right hip pain.  Plan: The treatment options were discussed with the patient. Although his plain x-rays and CT scan do not demonstrate a fracture of the pelvis or right hip region, he does have reproduction of his right hip pain with passive hip flexion and internal/external rotation, raising the concern for an occult fracture. Therefore, I feel that it would be best to proceed with an MRI scan of his right hip region in order to rule out an occult fracture. Regarding his L2 compression fracture identified on both plain films and CT scan, I will ask Dr. Rudene Christians to evaluate the patient to see if he might be interested in considering a kyphoplasty on this patient. If the patient's primary problem is an occult hip fracture, then this most likely would be treated surgically and the L2 compression fracture probably treated nonsurgically. If the MRI scan is negative, most likely all of the symptoms are coming from his L2 compression fracture and he most likely would benefit from the kyphoplasty.  Thank you for asking me to participate in the care of this most pleasant patient. I will be happy to follow him with you.   Pascal Lux, MD  Beeper #:  [phone]  06/27/2015 9:35 AM

## 2015-06-27 NOTE — Progress Notes (Signed)
[facility] [place] NAME: Adam Singleton    MR#:  [id number]  DATE OF BIRTH:  11-29-1928  SUBJECTIVE:   Pt. Here due to Hip rain and noted to have L2 compression fracture incidentally on CT abd/pelvis.   Poor historian due to dementia.   REVIEW OF SYSTEMS:    Review of Systems  Unable to perform ROS: dementia    Nutrition: Dysphagia 3 tolerating Diet: Yes Tolerating PT: Await Eval.    DRUG ALLERGIES:   Allergies  Allergen Reactions  . Vancomycin Hcl Rash    VITALS:  Blood pressure 151/74, pulse 79, temperature 97.9 F (36.6 C), temperature source Oral, resp. rate 18, height 5\' 10"  (1.778 m), weight 73.029 kg (161 lb), SpO2 97 %.  PHYSICAL EXAMINATION:   Physical Exam  GENERAL:  82 y.o.-year-old patient lying in the bed in no acute distress.  EYES: Pupils equal, round, reactive to light and accommodation. No scleral icterus. Extraocular muscles intact.  HEENT: Head atraumatic, normocephalic. Oropharynx and nasopharynx clear.  NECK:  Supple, no jugular venous distention. No thyroid enlargement, no tenderness.  LUNGS: Normal breath sounds bilaterally, no wheezing, rales, rhonchi. No use of accessory muscles of respiration.  CARDIOVASCULAR: S1, S2 normal. No murmurs, rubs, or gallops.  ABDOMEN: Soft, nontender, nondistended. Bowel sounds present. No organomegaly or mass.  EXTREMITIES: No cyanosis, clubbing or edema b/l.    NEUROLOGIC: Cranial nerves II through XII are intact. No focal Motor or sensory deficits b/l.  Globally weak. PSYCHIATRIC: The patient is alert and oriented x 1.  SKIN: No obvious rash, lesion, or ulcer.    LABORATORY PANEL:   CBC  Recent Labs Lab 06/27/15 0437  WBC 7.1  HGB 13.1  HCT 39.7*  PLT 94*   ------------------------------------------------------------------------------------------------------------------  Chemistries   Recent Labs Lab 06/26/15 1310 06/27/15 0437  NA 143 143   K 4.5 4.0  CL 108 110  CO2 28 27  GLUCOSE 117* 105*  BUN 15 13  CREATININE 0.71 0.72  CALCIUM 8.8* 8.4*  AST 26  --   ALT 24  --   ALKPHOS 114  --   BILITOT 1.1  --    ------------------------------------------------------------------------------------------------------------------  Cardiac Enzymes  Recent Labs Lab 06/26/15 1310  TROPONINI <0.03   ------------------------------------------------------------------------------------------------------------------  RADIOLOGY:  Ct Abdomen Pelvis W Contrast  06/26/2015  CLINICAL DATA:  Right hip pain. Dementia. The patient was unable to stand this morning. EXAM: CT ABDOMEN AND PELVIS WITH CONTRAST TECHNIQUE: Multidetector CT imaging of the abdomen and pelvis was performed using the standard protocol following bolus administration of intravenous contrast. CONTRAST:  164mL OMNIPAQUE IOHEXOL 300 MG/ML  SOLN COMPARISON:  06/26/2015 and 07/13/2009 FINDINGS: Lower chest: Emphysema. Scarring laterally in the right lower lobe. Scarring or atelectasis posteriorly in the left lower lobe. Calcification of the mitral and aortic valves. We image the bottom of a vague density in the subcutaneous tissues of the right anterior chest, 1.2 by 0.9 cm, previously in this vicinity there is a tiny 5 mm lymph node or similar structure. Hepatobiliary: There is progressive calcification of the gallbladder wall favoring porcelain gallbladder, increased from 2011. No obvious soft tissue mass. Common bile duct 9 mm in diameter, borderline prominent for age. Previously the CBD was 8 mm in diameter. Pancreas: Unremarkable Spleen: Unremarkable Adrenals/Urinary Tract: Unremarkable Stomach/Bowel: Sigmoid diverticulosis without active diverticulitis. Vascular/Lymphatic: Aortoiliac atherosclerotic vascular disease. Partially thrombosed infrarenal abdominal aortic aneurysm, 3.9 cm in anterior- posterior dimension, formerly 3.4 cm. No pathologic adenopathy  identified. Reproductive:  Mildly prominent prostate gland. Other: No supplemental non-categorized findings. Musculoskeletal: Suspected left inguinal hernia containing adipose tissue. Subtle superior endplate compression fracture at L2, uncertain chronicity, no definite paraspinal edema, no significant degree of bony retropulsion. There is a band of sclerosis below the superior endplate. There is degenerative arthropathy of both hips with axial loss of articular space, but no well-defined hip fracture. I do not observe a bony pelvic fracture. I do not see a significant hip effusion. IMPRESSION: 1. Subtle superior endplate compression fracture at L2, probably subacute or possibly acute. There is a sclerotic subcortical band along the superior endplate, but I do not observe significant bony retropulsion or significant paraspinal edema. There is only 5-10% compression currently. Correlate with point tenderness posteriorly over the L2 level. 2. 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 No current soft tissue mass. This condition can predispose to gallbladder carcinoma. Cholecystectomy is often favored, although the patient's age and comorbidities may factor into this decision. 3. I do not observe a hip or pelvic fracture. MRI does have higher sensitivity for subtle fractures of the head. 4. Emphysema. 5. Calcified mitral and aortic valves. 6. Sigmoid diverticulosis. 7. Infrarenal abdominal aortic aneurysm, 3.9 cm in the anterior- posterior dimension, previously 3.4 cm on 07/13/2009. Recommend followup by ultrasound in 2 years. This recommendation follows ACR consensus guidelines: White Paper of the ACR Incidental Findings Committee II on Vascular Findings. J Am Coll Radiol 2013; 10:789-794. Electronically Signed   By: Van Clines M.D.   On: 06/26/2015 15:25   Mr Hip Right Wo Contrast  06/27/2015  CLINICAL DATA:  82 year old with history of COPD and dementia. Back and right hip pain for 1 day. L2  fracture on CT. EXAM: MR OF THE RIGHT HIP WITHOUT CONTRAST TECHNIQUE: Multiplanar, multisequence MR imaging was performed. No intravenous contrast was administered. COMPARISON:  Abdominal pelvic CT 06/26/2015. FINDINGS: Bones: Both femoral heads appear normal without evidence of acute fracture, dislocation or avascular necrosis. The visualized bony pelvis appears normal. The sacroiliac joints and symphysis pubis appear normal. Degenerative disc disease noted at L5-S1. L2 is not imaged. Articular cartilage and labrum Articular cartilage: No significant hip degenerative changes for age. Labrum: There is no gross labral tear or paralabral abnormality. Joint or bursal effusion Joint effusion: No significant hip joint effusion. Bursae: No focal periarticular fluid collection. Muscles and tendons Muscles and tendons: The gluteus, hamstring and iliopsoas tendons appear normal. The piriformis muscles appear symmetric. There is mild symmetric T2 hyperintensity tracking between the iliacus muscles and iliac bones bilaterally. There is mild subcutaneous edema lateral to both hips. Other findings Miscellaneous: Asymmetric fat is noted in the left inguinal canal. There are changes of BPH which are asymmetric to the right. Mild sigmoid diverticulosis noted. IMPRESSION: 1. No acute findings demonstrated within the pelvis. No evidence of proximal femur fracture. 2. No significant right hip or sacroiliac joint arthropathy. Electronically Signed   By: Richardean Sale M.D.   On: 06/27/2015 12:57   Dg Hip Unilat With Pelvis 2-3 Views Right  06/26/2015  CLINICAL DATA:  Right hip pain this morning, no known injury EXAM: DG HIP (WITH OR WITHOUT PELVIS) 2-3V RIGHT COMPARISON:  None. FINDINGS: Diffuse osteopenia. Pelvic bones intact. Proximal femur shows no fracture or dislocation.Extensive calcification of the iliac and femoral arteries. IMPRESSION: No acute findings. Electronically Signed   By: Skipper Cliche M.D.   On: 06/26/2015  11:33     ASSESSMENT AND PLAN:   82 year old male with past  medical history of dementia, hyperlipidemia, hypertension, COPD who presented to the hospital due to back/hip pain and noted to have a compression fracture incidentally noted on a CT scan of the abdomen pelvis.  #1 back/hip pain-this is likely due to the compression fracture is noted on the CT scan of the abdomen and pelvis. -Patient underwent MRI of the hip which shows no evidence of hip fracture. Appreciate orthopedics input and continue supportive care with pain control for now. Dr. Rudene Christians to see pt. Regarding Kyphoplasty on Monday.   #2 dementia-continue Namenda, Aricept.  #3 glaucoma-continue Xalantan eyedrops.  #4 hyperlipidemia-continue Pravachol.  #5 COPD - no acute exacerbation.  - cont. Symbicort.   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 -Follow up with surgery as an outpatient.   All the records are reviewed and case discussed with Care Management/Social Workerr. Management plans discussed with the patient, family and they are in agreement.  CODE STATUS: DNR  DVT Prophylaxis: Lovenox  TOTAL TIME TAKING CARE OF THIS PATIENT: 30 minutes.   POSSIBLE D/C IN 1-2 DAYS, DEPENDING ON CLINICAL CONDITION.   Henreitta Leber M.D on 06/27/2015 at 2:35 PM  Between 7am to 6pm - Pager - [phone]  After 6pm go to [email or web address] - password [email or web address] [facility]  Office  [phone]  CC: Primary care physician; Lelon Huh, MD

## 2015-06-28 DIAGNOSIS — M5441 Lumbago with sciatica, right side: Secondary | ICD-10-CM | POA: Diagnosis not present

## 2015-06-28 DIAGNOSIS — S32020A Wedge compression fracture of second lumbar vertebra, initial encounter for closed fracture: Secondary | ICD-10-CM | POA: Diagnosis not present

## 2015-06-28 DIAGNOSIS — J9611 Chronic respiratory failure with hypoxia: Secondary | ICD-10-CM | POA: Diagnosis not present

## 2015-06-28 DIAGNOSIS — D696 Thrombocytopenia, unspecified: Secondary | ICD-10-CM | POA: Diagnosis not present

## 2015-06-28 DIAGNOSIS — M4856XA Collapsed vertebra, not elsewhere classified, lumbar region, initial encounter for fracture: Secondary | ICD-10-CM | POA: Diagnosis not present

## 2015-06-28 MED ORDER — BISACODYL 10 MG RE SUPP
10.0000 mg | Freq: Once | RECTAL | Status: AC
  Administered 2015-06-28: 10 mg via RECTAL
  Filled 2015-06-28: qty 1

## 2015-06-28 NOTE — Progress Notes (Signed)
[facility] NAME: Adam Singleton    MR#:  [id number]  DATE OF BIRTH:  [birth year]  SUBJECTIVE:   Pt. Here due to Hip rain and noted to have L2 compression fracture incidentally on CT abd/pelvis.   Poor historian due to dementia. Does not complain of back pain but does have discomfort when moving patient.  Possibly Kyphoplasty tomorrow.  Constipated.   REVIEW OF SYSTEMS:    Review of Systems  Unable to perform ROS: dementia    Nutrition: Dysphagia 3 tolerating Diet: Yes Tolerating PT: Await Eval.    DRUG ALLERGIES:   Allergies  Allergen Reactions  . Vancomycin Hcl Rash    VITALS:  Blood pressure 143/79, pulse 78, temperature 99.7 F (37.6 C), temperature source Axillary, resp. rate 18, height 5\' 10"  (1.778 m), weight 73.029 kg (161 lb), SpO2 98 %.  PHYSICAL EXAMINATION:   Physical Exam  GENERAL:  82 y.o.-year-old patient lying in the bed in no acute distress.  EYES: Pupils equal, round, reactive to light and accommodation. No scleral icterus. Extraocular muscles intact.  HEENT: Head atraumatic, normocephalic. Oropharynx and nasopharynx clear.  NECK:  Supple, no jugular venous distention. No thyroid enlargement, no tenderness.  LUNGS: Normal breath sounds bilaterally, no wheezing, rales, rhonchi. No use of accessory muscles of respiration.  CARDIOVASCULAR: S1, S2 normal. No murmurs, rubs, or gallops.  ABDOMEN: Soft, nontender, nondistended. Bowel sounds present. No organomegaly or mass.  EXTREMITIES: No cyanosis, clubbing or edema b/l.    NEUROLOGIC: Cranial nerves II through XII are intact. No focal Motor or sensory deficits b/l.  Globally weak. PSYCHIATRIC: The patient is alert and oriented x 1.  SKIN: No obvious rash, lesion, or ulcer.    LABORATORY PANEL:   CBC  Recent Labs Lab 06/27/15 0437  WBC 7.1  HGB 13.1  HCT 39.7*  PLT 94*    ------------------------------------------------------------------------------------------------------------------  Chemistries   Recent Labs Lab 06/26/15 1310 06/27/15 0437  NA 143 143  K 4.5 4.0  CL 108 110  CO2 28 27  GLUCOSE 117* 105*  BUN 15 13  CREATININE 0.71 0.72  CALCIUM 8.8* 8.4*  AST 26  --   ALT 24  --   ALKPHOS 114  --   BILITOT 1.1  --    ------------------------------------------------------------------------------------------------------------------  Cardiac Enzymes  Recent Labs Lab 06/26/15 1310  TROPONINI <0.03   ------------------------------------------------------------------------------------------------------------------  RADIOLOGY:  Ct Abdomen Pelvis W Contrast  06/26/2015  CLINICAL DATA:  Right hip pain. Dementia. The patient was unable to stand this morning. EXAM: CT ABDOMEN AND PELVIS WITH CONTRAST TECHNIQUE: Multidetector CT imaging of the abdomen and pelvis was performed using the standard protocol following bolus administration of intravenous contrast. CONTRAST:  16mL OMNIPAQUE IOHEXOL 300 MG/ML  SOLN COMPARISON:  06/26/2015 and 07/13/2009 FINDINGS: Lower chest: Emphysema. Scarring laterally in the right lower lobe. Scarring or atelectasis posteriorly in the left lower lobe. Calcification of the mitral and aortic valves. We image the bottom of a vague density in the subcutaneous tissues of the right anterior chest, 1.2 by 0.9 cm, previously in this vicinity there is a tiny 5 mm lymph node or similar structure. Hepatobiliary: There is progressive calcification of the gallbladder wall favoring porcelain gallbladder, increased from 2011. No obvious soft tissue mass. Common bile duct 9 mm in diameter, borderline prominent for age. Previously the CBD was 8 mm in diameter. Pancreas: Unremarkable Spleen: Unremarkable Adrenals/Urinary Tract: Unremarkable Stomach/Bowel: Sigmoid diverticulosis without active diverticulitis. Vascular/Lymphatic: Aortoiliac  atherosclerotic  vascular disease. Partially thrombosed infrarenal abdominal aortic aneurysm, 3.9 cm in anterior- posterior dimension, formerly 3.4 cm. No pathologic adenopathy identified. Reproductive: Mildly prominent prostate gland. Other: No supplemental non-categorized findings. Musculoskeletal: Suspected left inguinal hernia containing adipose tissue. Subtle superior endplate compression fracture at L2, uncertain chronicity, no definite paraspinal edema, no significant degree of bony retropulsion. There is a band of sclerosis below the superior endplate. There is degenerative arthropathy of both hips with axial loss of articular space, but no well-defined hip fracture. I do not observe a bony pelvic fracture. I do not see a significant hip effusion. IMPRESSION: 1. Subtle superior endplate compression fracture at L2, probably subacute or possibly acute. There is a sclerotic subcortical band along the superior endplate, but I do not observe significant bony retropulsion or significant paraspinal edema. There is only 5-10% compression currently. Correlate with point tenderness posteriorly over the L2 level. 2. 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 No current soft tissue mass. This condition can predispose to gallbladder carcinoma. Cholecystectomy is often favored, although the patient's age and comorbidities may factor into this decision. 3. I do not observe a hip or pelvic fracture. MRI does have higher sensitivity for subtle fractures of the head. 4. Emphysema. 5. Calcified mitral and aortic valves. 6. Sigmoid diverticulosis. 7. Infrarenal abdominal aortic aneurysm, 3.9 cm in the anterior- posterior dimension, previously 3.4 cm on 07/13/2009. Recommend followup by ultrasound in 2 years. This recommendation follows ACR consensus guidelines: White Paper of the ACR Incidental Findings Committee II on Vascular Findings. J Am Coll Radiol 2013; 10:789-794. Electronically  Signed   By: Van Clines M.D.   On: 06/26/2015 15:25   Mr Hip Right Wo Contrast  06/27/2015  CLINICAL DATA:  82 year old with history of 80 COPD and dementia. Back and right hip pain for 1 day. L2 fracture on CT. EXAM: MR OF THE RIGHT HIP WITHOUT CONTRAST TECHNIQUE: Multiplanar, multisequence MR imaging was performed. No intravenous contrast was administered. COMPARISON:  Abdominal pelvic CT 06/26/2015. FINDINGS: Bones: Both femoral heads appear normal without evidence of acute fracture, dislocation or avascular necrosis. The visualized bony pelvis appears normal. The sacroiliac joints and symphysis pubis appear normal. Degenerative disc disease noted at L5-S1. L2 is not imaged. Articular cartilage and labrum Articular cartilage: No significant hip degenerative changes for age. Labrum: There is no gross labral tear or paralabral abnormality. Joint or bursal effusion Joint effusion: No significant hip joint effusion. Bursae: No focal periarticular fluid collection. Muscles and tendons Muscles and tendons: The gluteus, hamstring and iliopsoas tendons appear normal. The piriformis muscles appear symmetric. There is mild symmetric T2 hyperintensity tracking between the iliacus muscles and iliac bones bilaterally. There is mild subcutaneous edema lateral to both hips. Other findings Miscellaneous: Asymmetric fat is noted in the left inguinal canal. There are changes of BPH which are asymmetric to the right. Mild sigmoid diverticulosis noted. IMPRESSION: 1. No acute findings demonstrated within the pelvis. No evidence of proximal femur fracture. 2. No significant right hip or sacroiliac joint arthropathy. Electronically Signed   By: Richardean Sale M.D.   On: 06/27/2015 12:57     ASSESSMENT AND PLAN:   82 year old male with past medical history of dementia, hyperlipidemia, hypertension, COPD who presented to the hospital due to back/hip pain and noted to have a compression fracture incidentally noted on a CT  scan of the abdomen pelvis.  #1 back/hip pain-this is likely due to the compression fracture is noted on the CT scan of the abdomen and pelvis. -  Patient underwent MRI of the hip which shows no evidence of hip fracture. Appreciate orthopedics input and continue supportive care with pain control for now.  - seen by Dr. Rudene Christians and plan for possible Kyphoplasty tomorrow.    #2 dementia-continue Namenda, Aricept.  #3 glaucoma-continue Xalantan eyedrops.  #4 hyperlipidemia-continue Pravachol.  #5 COPD - no acute exacerbation.  - cont. Symbicort.   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 -Follow up with surgery as an outpatient.  #7 constipation - will give Dulcolax suppository and monitor.   Possible d/c back to assisted Living after Kyphoplasty tomorrow.    All the records are reviewed and case discussed with Care Management/Social Workerr. Management plans discussed with the patient, family and they are in agreement.  CODE STATUS: DNR  DVT Prophylaxis: Lovenox  TOTAL TIME TAKING CARE OF THIS PATIENT: 25 minutes.   POSSIBLE D/C IN 1-2 DAYS, DEPENDING ON CLINICAL CONDITION.   Henreitta Leber M.D on 06/28/2015 at 12:40 PM  Between 7am to 6pm - Pager - [phone]  After 6pm go to [email or web address] - password [email or web address] [facility]  Office  [phone]  CC: Primary care physician; Lelon Huh, MD

## 2015-06-28 NOTE — Progress Notes (Addendum)
Subjective: Patient remains pleasantly confused. No new complaints.   Objective: Vital signs in last 24 hours: Temp:  [99.3 F (37.4 C)-99.8 F (37.7 C)] 99.7 F (37.6 C) (02/12 0746) Pulse Rate:  [74-99] 78 (02/12 0746) Resp:  [18] 18 (02/12 0746) BP: (134-157)/(70-82) 143/79 mmHg (02/12 0746) SpO2:  [94 %-98 %] 98 % (02/12 0746)  Intake/Output from previous day: 02/11 0701 - 02/12 0700 In: 120 [P.O.:120] Out: -  Intake/Output this shift:     Recent Labs  06/26/15 1310 06/27/15 0437  HGB 14.3 13.1    Recent Labs  06/26/15 1310 06/27/15 0437  WBC 8.5 7.1  RBC 4.72 4.27*  HCT 44.0 39.7*  PLT 102* 94*    Recent Labs  06/26/15 1310 06/27/15 0437  NA 143 143  K 4.5 4.0  CL 108 110  CO2 28 27  BUN 15 13  CREATININE 0.71 0.72  GLUCOSE 117* 105*  CALCIUM 8.8* 8.4*   No results for input(s): LABPT, INR in the last 72 hours.  Physical Exam: Unchanged versus yesterday. Patient remains neurovascularly intact to both lower extremities.  X-rays: The MRI scan of the right hip from yesterday shows no evidence for occult fractures of the hip or pelvic region.  Assessment: L2 compression fracture.  Plan: Given the negative MRI results for occult fracture of the hip or pelvis region, I feel that the majority of his pain indeed is coming from his L2 compression fracture. I have spoken with Dr. Richardean Sale about the feasibility of a kyphoplasty for this gentleman. He will review the patient's CT scan and evaluate the patient tomorrow morning for consideration of this procedure. Please keep the patient nothing by mouth after midnight in preparation for this possible procedure.   Adam Singleton 06/28/2015, 8:47 AM      Will set up for tomorrow, need to discuss with family

## 2015-06-29 ENCOUNTER — Observation Stay: Payer: Medicare Other | Admitting: Anesthesiology

## 2015-06-29 ENCOUNTER — Observation Stay: Payer: Medicare Other

## 2015-06-29 ENCOUNTER — Encounter
Admission: EM | Disposition: A | Discharge status: Home / Self Care | Payer: Self-pay | Source: Home / Self Care | Attending: Student

## 2015-06-29 DIAGNOSIS — I10 Essential (primary) hypertension: Secondary | ICD-10-CM | POA: Diagnosis not present

## 2015-06-29 DIAGNOSIS — D696 Thrombocytopenia, unspecified: Secondary | ICD-10-CM | POA: Diagnosis not present

## 2015-06-29 DIAGNOSIS — Z9889 Other specified postprocedural states: Secondary | ICD-10-CM | POA: Diagnosis not present

## 2015-06-29 DIAGNOSIS — M4856XA Collapsed vertebra, not elsewhere classified, lumbar region, initial encounter for fracture: Secondary | ICD-10-CM | POA: Diagnosis not present

## 2015-06-29 DIAGNOSIS — I739 Peripheral vascular disease, unspecified: Secondary | ICD-10-CM | POA: Diagnosis not present

## 2015-06-29 DIAGNOSIS — J9611 Chronic respiratory failure with hypoxia: Secondary | ICD-10-CM | POA: Diagnosis not present

## 2015-06-29 DIAGNOSIS — J449 Chronic obstructive pulmonary disease, unspecified: Secondary | ICD-10-CM | POA: Diagnosis not present

## 2015-06-29 DIAGNOSIS — S32020A Wedge compression fracture of second lumbar vertebra, initial encounter for closed fracture: Secondary | ICD-10-CM | POA: Diagnosis not present

## 2015-06-29 DIAGNOSIS — M5441 Lumbago with sciatica, right side: Secondary | ICD-10-CM | POA: Diagnosis not present

## 2015-06-29 HISTORY — PX: KYPHOPLASTY: SHX5884

## 2015-06-29 LAB — CREATININE, SERUM
CREATININE: 0.67 mg/dL (ref 0.61–1.24)
GFR calc Af Amer: >60 mL/min (ref >60)
GFR calc non Af Amer: >60 mL/min (ref >60)

## 2015-06-29 LAB — CBC
HCT: 41.2 % (ref 40.0–52.0)
Hemoglobin: 13.4 g/dL (ref 13.0–18.0)
MCH: 30.3 pg (ref 26.0–34.0)
MCHC: 32.6 g/dL (ref 32.0–36.0)
MCV: 92.8 fL (ref 80.0–100.0)
PLATELETS: 103 10*3/uL — AB (ref 150–440)
RBC: 4.44 MIL/uL (ref 4.40–5.90)
RDW: 14.5 % (ref 11.5–14.5)
WBC: 8.8 10*3/uL (ref 3.8–10.6)

## 2015-06-29 SURGERY — KYPHOPLASTY
Anesthesia: Monitor Anesthesia Care | Wound class: Clean

## 2015-06-29 MED ORDER — MIDAZOLAM HCL 2 MG/2ML IJ SOLN
INTRAMUSCULAR | Status: DC | PRN
  Administered 2015-06-29: 2 mg via INTRAVENOUS

## 2015-06-29 MED ORDER — CEFAZOLIN SODIUM 1 G IJ SOLR
INTRAMUSCULAR | Status: DC | PRN
  Administered 2015-06-29: 1 g via INTRAMUSCULAR

## 2015-06-29 MED ORDER — ONDANSETRON HCL 4 MG/2ML IJ SOLN: 4.0000 mg | Freq: Once | INTRAMUSCULAR | Status: DC | PRN

## 2015-06-29 MED ORDER — METOCLOPRAMIDE HCL 5 MG PO TABS: 5.0000 mg | ORAL_TABLET | Freq: Three times a day (TID) | ORAL | Status: DC | PRN

## 2015-06-29 MED ORDER — KETAMINE HCL 10 MG/ML IJ SOLN
INTRAMUSCULAR | Status: DC | PRN
  Administered 2015-06-29: 10 mg via INTRAVENOUS
  Administered 2015-06-29: 5 mg via INTRAVENOUS
  Administered 2015-06-29: 10 mg via INTRAVENOUS
  Administered 2015-06-29: 15 mg via INTRAVENOUS

## 2015-06-29 MED ORDER — LACTATED RINGERS IV SOLN
INTRAVENOUS | Status: DC | PRN
  Administered 2015-06-29: 17:00:00 via INTRAVENOUS

## 2015-06-29 MED ORDER — BUPIVACAINE-EPINEPHRINE (PF) 0.5% -1:200000 IJ SOLN
INTRAMUSCULAR | Status: DC | PRN
  Administered 2015-06-29: 10 mL

## 2015-06-29 MED ORDER — ENOXAPARIN SODIUM 40 MG/0.4ML ~~LOC~~ SOLN
40.0000 mg | SUBCUTANEOUS | Status: DC
  Administered 2015-06-30: 40 mg via SUBCUTANEOUS
  Filled 2015-06-29: qty 0.4

## 2015-06-29 MED ORDER — ONDANSETRON HCL 4 MG/2ML IJ SOLN
4.0000 mg | Freq: Four times a day (QID) | INTRAMUSCULAR | Status: DC | PRN
Start: 2015-06-29 — End: 2015-06-30

## 2015-06-29 MED ORDER — METOCLOPRAMIDE HCL 5 MG/ML IJ SOLN: 5.0000 mg | Freq: Three times a day (TID) | INTRAMUSCULAR | Status: DC | PRN

## 2015-06-29 MED ORDER — FENTANYL CITRATE (PF) 100 MCG/2ML IJ SOLN: 25.0000 ug | INTRAMUSCULAR | Status: DC | PRN

## 2015-06-29 MED ORDER — LIDOCAINE HCL 1 % IJ SOLN
INTRAMUSCULAR | Status: DC | PRN
  Administered 2015-06-29: 20 mL

## 2015-06-29 MED ORDER — IOHEXOL 180 MG/ML  SOLN
INTRAMUSCULAR | Status: DC | PRN
  Administered 2015-06-29: 20 mL

## 2015-06-29 MED ORDER — PROPOFOL 500 MG/50ML IV EMUL
INTRAVENOUS | Status: DC | PRN
  Administered 2015-06-29: 15 ug/kg/min via INTRAVENOUS

## 2015-06-29 MED ORDER — DOCUSATE SODIUM 100 MG PO CAPS
100.0000 mg | ORAL_CAPSULE | Freq: Two times a day (BID) | ORAL | Status: DC
  Administered 2015-06-29 – 2015-06-30 (×2): 100 mg via ORAL
  Filled 2015-06-29 (×2): qty 1

## 2015-06-29 MED ORDER — ONDANSETRON HCL 4 MG PO TABS: 4.0000 mg | ORAL_TABLET | Freq: Four times a day (QID) | ORAL | Status: DC | PRN

## 2015-06-29 SURGICAL SUPPLY — 13 items
CEMENT KYPHON CX01A KIT/MIXER (Cement) ×3 IMPLANT
DEVICE BIOPSY BONE KYPHX (INSTRUMENTS) ×3 IMPLANT
DRAPE C-ARM XRAY 36X54 (DRAPES) ×3 IMPLANT
DURAPREP 26ML APPLICATOR (WOUND CARE) ×3 IMPLANT
GLOVE SURG ORTHO 9.0 STRL STRW (GLOVE) ×3 IMPLANT
GOWN SPECIALTY ULTRA XL (MISCELLANEOUS) ×3 IMPLANT
GOWN STRL REUS W/ TWL LRG LVL3 (GOWN DISPOSABLE) ×1 IMPLANT
GOWN STRL REUS W/TWL LRG LVL3 (GOWN DISPOSABLE) ×2
LIQUID BAND (GAUZE/BANDAGES/DRESSINGS) ×3 IMPLANT
PACK KYPHOPLASTY (MISCELLANEOUS) ×3 IMPLANT
STRAP SAFETY BODY (MISCELLANEOUS) ×3 IMPLANT
TRAY KYPHOPAK 15/3 EXPRESS 1ST (MISCELLANEOUS) IMPLANT
TRAY KYPHOPAK 20/3 EXPRESS 1ST (MISCELLANEOUS) ×3 IMPLANT

## 2015-06-29 NOTE — Anesthesia Preprocedure Evaluation (Signed)
Anesthesia Evaluation  Patient identified by MRN, date of birth, ID band Patient awake    Reviewed: Allergy & Precautions, H&P , NPO status , Patient's Chart, lab work & pertinent test results, reviewed documented beta blocker date and time   Airway Mallampati: II  TM Distance: >3 FB Neck ROM: full    Dental no notable dental hx.    Pulmonary neg pulmonary ROS, COPD, former smoker,    Pulmonary exam normal breath sounds clear to auscultation       Cardiovascular Exercise Tolerance: Good hypertension, + Peripheral Vascular Disease  negative cardio ROS   Rhythm:regular Rate:Normal     Neuro/Psych PSYCHIATRIC DISORDERS negative neurological ROS  negative psych ROS   GI/Hepatic negative GI ROS, Neg liver ROS,   Endo/Other  negative endocrine ROSdiabetes  Renal/GU      Musculoskeletal   Abdominal   Peds  Hematology negative hematology ROS (+)   Anesthesia Other Findings   Reproductive/Obstetrics negative OB ROS                             Anesthesia Physical Anesthesia Plan  ASA: III and emergent  Anesthesia Plan: MAC   Post-op Pain Management:    Induction:   Airway Management Planned:   Additional Equipment:   Intra-op Plan:   Post-operative Plan:   Informed Consent: I have reviewed the patients History and Physical, chart, labs and discussed the procedure including the risks, benefits and alternatives for the proposed anesthesia with the patient or authorized representative who has indicated his/her understanding and acceptance.     Plan Discussed with: CRNA  Anesthesia Plan Comments:         Anesthesia Quick Evaluation

## 2015-06-29 NOTE — Clinical Social Work Note (Signed)
Clinical Social Work Assessment  Patient Details  Name: Adam Singleton MRN: [id number] Date of Birth: 1928-06-30  Date of referral:  06/29/15               Reason for consult:   (from [place])                Permission sought to share information with:  Facility Sport and exercise psychologist, Family Supports Permission granted to share information::     Name::      [facility] Ulice Dash  [phone])  Agency::     Relationship::     Contact Information:     Housing/Transportation Living arrangements for the past 2 months:  [place] of Information:  Medical Team, Facility Patient Interpreter Needed:  None Criminal Activity/Legal Involvement Pertinent to Current Situation/Hospitalization:    Significant Relationships:  Adult Children Lives with:  Facility Resident Do you feel safe going back to the place where you live?    Need for family participation in patient care:     Care giving concerns:  None at this time   Facilities manager / plan:  Holiday representative (CSW) consult, patient is from ALF.    Patient resting no family at bedside, Information provided by facility).  CSW called ALF, introduced self and explained role of CSW department.  Patient has lived at [facility] since July 2015.  He is currently private pay.  His HPOA Jaycek Zubair [phone] is very involved with patient. Per [employer] patient is unable to return if he can't walk.   Per son Ulice Dash, he would like patient to return to [place] if he is able.  Will talk with family to determine a back ups plan if patient is unable to return due to his mobility.     CSW will complete FL2 in anticipation of patient returning to ALF,  Will modify if patient needs SNF.  Employment status:    Insurance information:  Medicare PT Recommendations:  Not assessed at this time Information / Referral to community resources:   (none at this time)  Patient/Family's Response to care:  Patient's son was  appreciative of information provided by CSW and will talk with his family to discuss discharge options   Patient/Family's Understanding of and Emotional Response to Diagnosis, Current Treatment, and Prognosis:  Patient's family understands that he is under continued medical work up at this time.  Once medically stable patient will discharge back to ALF or to SNF.  Emotional Assessment Appearance:  Appears stated age Attitude/Demeanor/Rapport:  Unable to Assess Affect (typically observed):  Unable to Assess Orientation:  Oriented to Self Alcohol / Substance use:    Psych involvement (Current and /or in the community):  No (Comment)  Discharge Needs  Concerns to be addressed:  Care Coordination, Discharge Planning Concerns Readmission within the last 30 days:  No Current discharge risk:  Chronically ill, Cognitively Impaired, Dependent with Mobility Barriers to Discharge:  Continued Medical Work up   [employer], LCSW 06/29/2015, 10:20 AM

## 2015-06-29 NOTE — Progress Notes (Signed)
Pt just left for the OR. Transported by OR orderly

## 2015-06-29 NOTE — NC FL2 (Signed)
[place] LEVEL OF CARE SCREENING TOOL     IDENTIFICATION  Patient Name: Adam Singleton Birthdate: 03-Mar-1929 Sex: male Admission Date (Current Location): 06/26/2015  [place] and [place] Number:  Engineering geologist and Address:  [facility], [address], [place], [place] [address]      Provider Number: [id number]  Attending Physician Name and Address:  Henreitta Leber, MD  Relative Name and Phone Number:       Current Level of Care: Hospital Recommended Level of Care: [place], Memory Care Prior Approval Number:    Date Approved/Denied:   PASRR Number: [id number]  Discharge Plan:      Current Diagnoses: Patient Active Problem List   Diagnosis Date Noted  . Pressure ulcer 06/27/2015  . Compression fracture of L2 ([place]) 06/26/2015  . Back pain 06/26/2015  . 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 Emphysema lung ([place]) 06/26/2015  . Abdominal aortic aneurysm (AAA) ([place]) 06/26/2015  . Hyperglycemia 11/05/2014  . History of DVT (deep vein thrombosis) 11/05/2014  . 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 Personal history of PE (pulmonary embolism) 11/05/2014  . Obstruction to urinary outflow 11/05/2014  . History of colonic polyps 09/24/2014  . COPD (chronic obstructive pulmonary disease) ([place]) 09/24/2014  . H/O cardiovascular disorder 09/24/2014  . H/O Malignant melanoma 09/24/2014  . Prostate disease 09/24/2014  . Thrombocytopenia ([place]) 09/24/2014  . Alzheimer's dementia 12/09/2009  . Hyperlipidemia 12/09/2009  . Malignant neoplasm of scalp and skin of neck 11/09/2009    Orientation RESPIRATION BLADDER Height & Weight     Self    Incontinent Weight: 161 lb (73.029 kg) Height:  5\' 10"  (177.8 cm)  BEHAVIORAL SYMPTOMS/MOOD NEUROLOGICAL BOWEL NUTRITION STATUS      Incontinent Diet  AMBULATORY STATUS COMMUNICATION OF NEEDS Skin   Extensive Assist Verbally Surgical wounds                       Personal Care  Assistance Level of Assistance  Bathing, Feeding, Dressing Bathing Assistance: Maximum assistance Feeding assistance: Limited assistance Dressing Assistance: Maximum assistance     Functional Limitations Info  Sight, Hearing, Speech Sight Info: Adequate Hearing Info: Adequate Speech Info: Adequate    SPECIAL CARE FACTORS FREQUENCY  PT (By licensed PT)                    Contractures      Additional Factors Info  Code Status, Allergies Code Status Info: DNR Allergies Info: Vancomycin Hcl           Current Medications (06/29/2015):  This is the current hospital active medication list Current Facility-Administered Medications  Medication Dose Route Frequency Provider Last Rate Last Dose  . acetaminophen (TYLENOL) tablet 650 mg  650 mg Oral Q4H PRN Theodoro Grist, MD   650 mg at 06/28/15 2022  . budesonide-formoterol (SYMBICORT) 160-4.5 MCG/ACT inhaler 2 puff  2 puff Inhalation BID Theodoro Grist, MD   2 puff at 06/29/15 0846  . donepezil (ARICEPT) tablet 10 mg  10 mg Oral QHS Theodoro Grist, MD   10 mg at 06/28/15 2022  . guaiFENesin-dextromethorphan (ROBITUSSIN DM) 100-10 MG/5ML syrup 5 mL  5 mL Oral Q4H PRN Theodoro Grist, MD      . HYDROmorphone (DILAUDID) injection 1 mg  1 mg Intravenous Q2H PRN Theodoro Grist, MD   1 mg at 06/26/15 1700  . latanoprost (XALATAN) 0.005 % ophthalmic solution 1 drop  1 drop Both Eyes QHS Rima  Ether Griffins, MD   1 drop at 06/26/15 2106  . memantine (NAMENDA) tablet 10 mg  10 mg Oral BID Theodoro Grist, MD   10 mg at 06/28/15 2022  . oxyCODONE (Oxy IR/ROXICODONE) immediate release tablet 5 mg  5 mg Oral Q4H PRN Theodoro Grist, MD   5 mg at 06/27/15 0251  . pravastatin (PRAVACHOL) tablet 20 mg  20 mg Oral q1800 Theodoro Grist, MD   20 mg at 06/28/15 [id number]     Discharge Medications: Please see discharge summary for a list of discharge medications.  Relevant Imaging Results:  Relevant Lab Results:   Additional Information [id number]  Maurine Cane, LCSW

## 2015-06-29 NOTE — Op Note (Signed)
06/26/2015 - 06/29/2015  5:48 PM  PATIENT:  Adam Singleton  82 y.o. male  PRE-OPERATIVE DIAGNOSIS:  L2 compression fracture  POST-OPERATIVE DIAGNOSIS:  L2 compression fracture  PROCEDURE:  Procedure(s): KYPHOPLASTY L2 (N/A)  SURGEON: Laurene Footman, MD  ASSISTANTS: None  ANESTHESIA:   local and MAC  EBL:     BLOOD ADMINISTERED:none  DRAINS: none   LOCAL MEDICATIONS USED:  MARCAINE    and LIDOCAINE   SPECIMEN:  No Specimen  DISPOSITION OF SPECIMEN:  N/A  COUNTS:  YES  TOURNIQUET:  * No tourniquets in log *  IMPLANTS: Bone cement  DICTATION: .Dragon Dictation patient brought the operating room and after adequate sedation was given, patient was placed prone. C-arm was brought in and good visualization of the L2 compression fractures obtained in both AP and lateral projections. After patient identification and timeout procedures were completed skin was prepped with alcohol and Xylocaine was a low traded on the right and left side 5 cc each. The back was then prepped and draped in sterile fashion and repeat timeout procedure carried out. Spinal needle was used to get local asthenic down to the pedicle on the right side with a half percent Sensorcaine with epinephrine 1% Xylocaine total 20 cc total, after allowing this  to set a small incision was made and trochars advanced in a transpedicular approach. Biopsy was attempted to obtain but no bone was obtained. Drilling was carried out followed by balloon inflation and approximate 4 cc. The bone cement was mixed and was appropriate consistency fill the vertebral body across the midline and getting good fill centrally. After the cement was set the trochars removed and permanent C-arm views obtained. Wound was closed with Dermabond followed by a Band-Aid   PLAN OF CARE: Continue as inpatient  PATIENT DISPOSITION:  PACU - hemodynamically stable.

## 2015-06-29 NOTE — Anesthesia Procedure Notes (Signed)
Date/Time: 06/29/2015 5:21 PM Performed by: Nelda Marseille Pre-anesthesia Checklist: Patient identified, Emergency Drugs available, Suction available, Patient being monitored and Timeout performed Oxygen Delivery Method: Nasal cannula

## 2015-06-29 NOTE — Progress Notes (Signed)
[place] [facility] [place] NAME: Adam Singleton    MR#:  [id number]  DATE OF BIRTH:  12/21/1928  SUBJECTIVE:   Pt. Here due to Hip rain and noted to have L2 compression fracture incidentally on CT abd/pelvis.   Poor historian due to dementia. Does not complain of back pain but does have discomfort when moving patient.  Going for kyphoplasty today.  REVIEW OF SYSTEMS:    Review of Systems  Unable to perform ROS: dementia    Nutrition: Dysphagia 3 tolerating Diet: Yes Tolerating PT: Await Eval.    DRUG ALLERGIES:   Allergies  Allergen Reactions  . Vancomycin Hcl Rash    VITALS:  Blood pressure 131/57, pulse 59, temperature 97.6 F (36.4 C), temperature source Oral, resp. rate 18, height 5\' 10"  (1.778 m), weight 73.029 kg (161 lb), SpO2 100 %.  PHYSICAL EXAMINATION:   Physical Exam  GENERAL:  82 y.o.-year-old patient lying in the bed in no acute distress.  EYES: Pupils equal, round, reactive to light and accommodation. No scleral icterus. Extraocular muscles intact.  HEENT: Head atraumatic, normocephalic. Oropharynx and nasopharynx clear.  NECK:  Supple, no jugular venous distention. No thyroid enlargement, no tenderness.  LUNGS: Normal breath sounds bilaterally, no wheezing, rales, rhonchi. No use of accessory muscles of respiration.  CARDIOVASCULAR: S1, S2 normal. No murmurs, rubs, or gallops.  ABDOMEN: Soft, nontender, nondistended. Bowel sounds present. No organomegaly or mass.  EXTREMITIES: No cyanosis, clubbing or edema b/l.    NEUROLOGIC: Cranial nerves II through XII are intact. No focal Motor or sensory deficits b/l.  Globally weak. PSYCHIATRIC: The patient is alert and oriented x 1.  SKIN: No obvious rash, lesion, or ulcer.    LABORATORY PANEL:   CBC  Recent Labs Lab 06/27/15 0437  WBC 7.1  HGB 13.1  HCT 39.7*  PLT 94*    ------------------------------------------------------------------------------------------------------------------  Chemistries   Recent Labs Lab 06/26/15 1310 06/27/15 0437  NA 143 143  K 4.5 4.0  CL 108 110  CO2 28 27  GLUCOSE 117* 105*  BUN 15 13  CREATININE 0.71 0.72  CALCIUM 8.8* 8.4*  AST 26  --   ALT 24  --   ALKPHOS 114  --   BILITOT 1.1  --    ------------------------------------------------------------------------------------------------------------------  Cardiac Enzymes  Recent Labs Lab 06/26/15 1310  TROPONINI <0.03   ------------------------------------------------------------------------------------------------------------------  RADIOLOGY:  No results found.   ASSESSMENT AND PLAN:   82 year old male with past medical history of dementia, hyperlipidemia, hypertension, COPD who presented to the hospital due to back/hip pain and noted to have a compression fracture incidentally noted on a CT scan of the abdomen pelvis.  #1 back/hip pain-this is likely due to the compression fracture is noted on the CT scan of the abdomen and pelvis. -Patient underwent MRI of the hip which showed no evidence of hip fracture and therefore most of his pain is coming from his L2 compression fracture. Seen by Orthopedics (Dr. Rudene Christians) and going for kyphoplasty later today. Burnis Medin get physical therapy consult post kyphoplasty tomorrow.  #2 dementia-continue Namenda, Aricept.  #3 glaucoma-continue Xalantan eyedrops.  #4 hyperlipidemia-continue Pravachol.  #5 COPD - no acute exacerbation.  - cont. Symbicort.   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 -Follow up with surgery as an outpatient.  #7 constipation - resolved with dulcolax given yesterday.   Await PT eval after kyphoplasty tomorrow.      All  the records are reviewed and case discussed with Care Management/Social Workerr. Management plans discussed with  the patient, family and they are in agreement.  CODE STATUS: DNR  DVT Prophylaxis: Lovenox  TOTAL TIME TAKING CARE OF THIS PATIENT: 25 minutes.   POSSIBLE D/C IN 1-2 DAYS, DEPENDING ON CLINICAL CONDITION.   Henreitta Leber M.D on 06/29/2015 at 2:53 PM  Between 7am to 6pm - Pager - [phone]  After 6pm go to [email or web address] - password [email or web address] [facility]  Office  [phone]  CC: Primary care physician; Lelon Huh, MD

## 2015-06-29 NOTE — Transfer of Care (Signed)
Immediate Anesthesia Transfer of Care Note  Patient: Adam Singleton  Procedure(s) Performed: Procedure(s): KYPHOPLASTY L2 (N/A)  Patient Location: PACU  Anesthesia Type:General  Level of Consciousness: sedated  Airway & Oxygen Therapy: Patient Spontanous Breathing and Patient connected to nasal cannula oxygen  Post-op Assessment: Report given to RN and Post -op Vital signs reviewed and stable  Post vital signs: Reviewed and stable  Last Vitals:  Filed Vitals:   06/29/15 0746 06/29/15 1457  BP:  134/57  Pulse:  65  Temp: 36.4 C 36.7 C  Resp:  18    Complications: No apparent anesthesia complications

## 2015-06-30 ENCOUNTER — Telehealth: Payer: Self-pay | Admitting: Family Medicine

## 2015-06-30 ENCOUNTER — Encounter: Payer: Self-pay | Admitting: Orthopedic Surgery

## 2015-06-30 DIAGNOSIS — M5441 Lumbago with sciatica, right side: Secondary | ICD-10-CM | POA: Diagnosis not present

## 2015-06-30 DIAGNOSIS — D696 Thrombocytopenia, unspecified: Secondary | ICD-10-CM | POA: Diagnosis not present

## 2015-06-30 DIAGNOSIS — M6281 Muscle weakness (generalized): Secondary | ICD-10-CM | POA: Diagnosis not present

## 2015-06-30 DIAGNOSIS — J9611 Chronic respiratory failure with hypoxia: Secondary | ICD-10-CM | POA: Diagnosis not present

## 2015-06-30 DIAGNOSIS — S32020A Wedge compression fracture of second lumbar vertebra, initial encounter for closed fracture: Secondary | ICD-10-CM | POA: Diagnosis not present

## 2015-06-30 DIAGNOSIS — M4856XA Collapsed vertebra, not elsewhere classified, lumbar region, initial encounter for fracture: Secondary | ICD-10-CM | POA: Diagnosis not present

## 2015-06-30 LAB — CBC
HCT: 38.4 % — ABNORMAL LOW (ref 40.0–52.0)
Hemoglobin: 12.6 g/dL — ABNORMAL LOW (ref 13.0–18.0)
MCH: 30.4 pg (ref 26.0–34.0)
MCHC: 32.8 g/dL (ref 32.0–36.0)
MCV: 92.7 fL (ref 80.0–100.0)
PLATELETS: 102 10*3/uL — AB (ref 150–440)
RBC: 4.14 MIL/uL — AB (ref 4.40–5.90)
RDW: 14.5 % (ref 11.5–14.5)
WBC: 7 10*3/uL (ref 3.8–10.6)

## 2015-06-30 LAB — BASIC METABOLIC PANEL
ANION GAP: 5 (ref 5–15)
BUN: 12 mg/dL (ref 6–20)
CALCIUM: 7.7 mg/dL — AB (ref 8.9–10.3)
CO2: 28 mmol/L (ref 22–32)
Chloride: 101 mmol/L (ref 101–111)
Creatinine, Ser: 0.68 mg/dL (ref 0.61–1.24)
Glucose, Bld: 98 mg/dL (ref 65–99)
POTASSIUM: 3.7 mmol/L (ref 3.5–5.1)
SODIUM: 134 mmol/L — AB (ref 135–145)

## 2015-06-30 MED ORDER — OXYCODONE HCL 5 MG PO TABS: 5.0000 mg | ORAL_TABLET | ORAL | Status: DC | PRN

## 2015-06-30 NOTE — Discharge Summary (Addendum)
[place] [facility] [place] NAME: Adam Singleton    MR#:  [id number]  DATE OF BIRTH:  05/25/1928  DATE OF ADMISSION:  06/26/2015 ADMITTING PHYSICIAN: Theodoro Grist, MD  DATE OF DISCHARGE: 06/30/2015  PRIMARY CARE PHYSICIAN: Lelon Huh, MD    ADMISSION DIAGNOSIS:  Hip pain, acute, right [M25.551] Lumbar compression fracture, closed, initial encounter ([place]) [S32.000A] Midline back pain, unspecified location [M54.89]  DISCHARGE DIAGNOSIS:  Active Problems:   Compression fracture of L2 (HCC)   Back pain   Porcelain gallbladder   Emphysema lung (HCC)   Abdominal aortic aneurysm (AAA) ([place])   Pressure ulcer   SECONDARY DIAGNOSIS:   Past Medical History  Diagnosis Date  . Colon polyps   . Clotting disorder (HCC)     Thrombocytopenia  . Hyperlipidemia   . Alzheimer's disease 12/09/2009  . Hypertension   . COPD (chronic obstructive pulmonary disease) ([place])   . Cancer ([place])     malignant melanoma  . Tachycardia     HOSPITAL COURSE:   82 year old male with past medical history of dementia, hyperlipidemia, hypertension, COPD who presented to the hospital due to back/hip pain and noted to have a compression fracture incidentally noted on a CT scan of the abdomen pelvis.  #1 back/hip pain-this is likely due to the compression fracture is noted on the CT scan of the abdomen and pelvis. -Patient underwent MRI of the hip which showed no evidence of hip fracture and therefore most of his pain was coming from his L2 compression fracture. Seen by Orthopedics (Dr. Rudene Christians) and is status post kyphoplasty postop day #1. -Patient's pain is well-controlled on oral meds. He was seen by physical therapy and recommended home health services and therefore is being discharged back to the assisted living with physical therapy presently. -he will follow-up with orthopedics in the next 2 weeks.  #2 dementia- pt. Will continue Namenda, Aricept.  #3  glaucoma- pt. Will continue Xalantan eyedrops.  #4 hyperlipidemia- he will continue Pravachol.  #5 COPD - he had no acute exacerbation while in the hospital.  - he will cont. Symbicort.   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 -pt. Should Follow up with surgery as an outpatient.  #7 constipation - now resolved w/ Dulcolax.    Pt. Is being discharged back to assisted living w/ PT services. Pt. Is to be followed by Palliative Nurse Practitioner while at [facility].    DISCHARGE CONDITIONS:   Stable.   CONSULTS OBTAINED:     DRUG ALLERGIES:   Allergies  Allergen Reactions  . Vancomycin Hcl Rash    DISCHARGE MEDICATIONS:   Current Discharge Medication List    START taking these medications   Details  oxyCODONE (OXY IR/ROXICODONE) 5 MG immediate release tablet Take 1 tablet (5 mg total) by mouth every 4 (four) hours as needed for moderate pain. Qty: 30 tablet, Refills: 0      CONTINUE these medications which have NOT CHANGED   Details  acetaminophen (TYLENOL) 325 MG tablet Take 2 tablets by mouth every 4 (four) hours as needed. for mild pain or fever    aspirin 81 MG EC tablet Take 1 tablet by mouth daily.    budesonide-formoterol (SYMBICORT) 160-4.5 MCG/ACT inhaler Inhale 2 puffs into the lungs 2 (two) times daily. For chronic obstructive pulmonary    Dextromethorphan-Guaifenesin 10-100 MG/5ML liquid Take 5 mLs by mouth every 4 (four) hours as needed. For cough  donepezil (ARICEPT) 10 MG tablet Take 1 tablet by mouth at bedtime.    lovastatin (MEVACOR) 20 MG tablet Take 1 tablet by mouth daily.    memantine (NAMENDA) 10 MG tablet Take 1 tablet by mouth 2 (two) times daily.    Travoprost, BAK Free, (TRAVATAN) 0.004 % SOLN ophthalmic solution Apply 1 drop to eye at bedtime.         DISCHARGE INSTRUCTIONS:   DIET:  Cardiac diet  DISCHARGE CONDITION:  Stable  ACTIVITY:  Activity as  tolerated  OXYGEN:  Home Oxygen: No.   Oxygen Delivery: room air  DISCHARGE LOCATION:  Assisted living with outpatient PT.   If you experience worsening of your admission symptoms, develop shortness of breath, life threatening emergency, suicidal or homicidal thoughts you must seek medical attention immediately by calling 911 or calling your MD immediately  if symptoms less severe.  You Must read complete instructions/literature along with all the possible adverse reactions/side effects for all the Medicines you take and that have been prescribed to you. Take any new Medicines after you have completely understood and accpet all the possible adverse reactions/side effects.   Please note  You were cared for by a hospitalist during your hospital stay. If you have any questions about your discharge medications or the care you received while you were in the hospital after you are discharged, you can call the unit and asked to speak with the hospitalist on call if the hospitalist that took care of you is not available. Once you are discharged, your primary care physician will handle any further medical issues. Please note that NO REFILLS for any discharge medications will be authorized once you are discharged, as it is imperative that you return to your primary care physician (or establish a relationship with a primary care physician if you do not have one) for your aftercare needs so that they can reassess your need for medications and monitor your lab values.     Today   Pt. Sitting up in chair in NAD.  Family at bedside.  No complaints presently. S/p Kyphoplasty POD # 1.   VITAL SIGNS:  Blood pressure 167/63, pulse 72, temperature 97.5 F (36.4 C), temperature source Oral, resp. rate 18, height 5\' 10"  (1.778 m), weight 73.029 kg (161 lb), SpO2 93 %.  I/O:   Intake/Output Summary (Last 24 hours) at 06/30/15 1123 Last data filed at 06/30/15 0935  Gross per 24 hour  Intake   1350 ml   Output      0 ml  Net   1350 ml    PHYSICAL EXAMINATION:   GENERAL: 82 y.o.-year-old patient lying in the bed in no acute distress.  EYES: Pupils equal, round, reactive to light and accommodation. No scleral icterus. Extraocular muscles intact.  HEENT: Head atraumatic, normocephalic. Oropharynx and nasopharynx clear.  NECK: Supple, no jugular venous distention. No thyroid enlargement, no tenderness.  LUNGS: Normal breath sounds bilaterally, no wheezing, rales, rhonchi. No use of accessory muscles of respiration.  CARDIOVASCULAR: S1, S2 normal. No murmurs, rubs, or gallops.  ABDOMEN: Soft, nontender, nondistended. Bowel sounds present. No organomegaly or mass.  EXTREMITIES: No cyanosis, clubbing or edema b/l.  NEUROLOGIC: Cranial nerves II through XII are intact. No focal Motor or sensory deficits b/l. Globally weak. PSYCHIATRIC: The patient is alert and oriented x 1.  SKIN: No obvious rash, lesion, or ulcer.   DATA REVIEW:   CBC  Recent Labs Lab 06/30/15 0607  WBC 7.0  HGB 12.6*  HCT  38.4*  PLT 102*    Chemistries   Recent Labs Lab 06/26/15 1310  06/30/15 0607  NA 143  < > 134*  K 4.5  < > 3.7  CL 108  < > 101  CO2 28  < > 28  GLUCOSE 117*  < > 98  BUN 15  < > 12  CREATININE 0.71  < > 0.68  CALCIUM 8.8*  < > 7.7*  AST 26  --   --   ALT 24  --   --   ALKPHOS 114  --   --   BILITOT 1.1  --   --   < > = values in this interval not displayed.  Cardiac Enzymes  Recent Labs Lab 06/26/15 1310  TROPONINI <0.03    Microbiology Results  Results for orders placed or performed during the hospital encounter of 06/26/15  MRSA PCR Screening     Status: None   Collection Time: 06/26/15  8:24 PM  Result Value Ref Range Status   MRSA by PCR NEGATIVE NEGATIVE Final    Comment:        The GeneXpert MRSA Assay (FDA approved for NASAL specimens only), is one component of a comprehensive MRSA colonization surveillance program. It is not intended to  diagnose MRSA infection nor to guide or monitor treatment for MRSA infections.     RADIOLOGY:  Dg Lumbar Spine 2-3 Views  06/30/2015  CLINICAL DATA:  Post kyphoplasty EXAM: LUMBAR SPINE - 2-3 VIEW; DG C-ARM 61-120 MIN COMPARISON:  06/26/2015 FINDINGS: Frontal and lateral view of the lumbar spine submitted. There is kyphoplasty presumably at L2 level. The alignment and disc spaces are preserved. IMPRESSION: Kyphoplasty presumably at L2 level. Alignment and disc spaces are preserved. Fluoroscopy time was 1 minutes 24 seconds. Please see the operative report. Electronically Signed   By: Lahoma Crocker M.D.   On: 06/30/2015 08:37   Dg C-arm 1-60 Min  06/30/2015  CLINICAL DATA:  Post kyphoplasty EXAM: LUMBAR SPINE - 2-3 VIEW; DG C-ARM 61-120 MIN COMPARISON:  06/26/2015 FINDINGS: Frontal and lateral view of the lumbar spine submitted. There is kyphoplasty presumably at L2 level. The alignment and disc spaces are preserved. IMPRESSION: Kyphoplasty presumably at L2 level. Alignment and disc spaces are preserved. Fluoroscopy time was 1 minutes 24 seconds. Please see the operative report. Electronically Signed   By: Lahoma Crocker M.D.   On: 06/30/2015 08:37      Management plans discussed with the patient, family and they are in agreement.  CODE STATUS:     Code Status Orders        Start     Ordered   06/26/15 1939  Do not attempt resuscitation (DNR)   Continuous    Question Answer Comment  In the event of cardiac or respiratory ARREST Do not call a "code blue"   In the event of cardiac or respiratory ARREST Do not perform Intubation, CPR, defibrillation or ACLS   In the event of cardiac or respiratory ARREST Use medication by any route, position, wound care, and other measures to relive pain and suffering. May use oxygen, suction and manual treatment of airway obstruction as needed for comfort.      06/26/15 1938    Code Status History    Date Active Date Inactive Code Status Order ID Comments  User Context   06/26/2015  6:56 PM 06/26/2015  7:39 PM Full Code [id number]  Theodoro Grist, MD Inpatient    Advance Directive Documentation  Most Recent Value   Type of Advance Directive  Out of facility DNR (pink MOST or yellow form)   Pre-existing out of facility DNR order (yellow form or pink MOST form)  Physician notified to receive inpatient order   "MOST" Form in Place?        TOTAL TIME TAKING CARE OF THIS PATIENT: 40 minutes.    Henreitta Leber M.D on 06/30/2015 at 11:23 AM  Between 7am to 6pm - Pager - [phone]  After 6pm go to [email or web address] - password [email or web address] [facility]  Office  [phone]  CC: Primary care physician; Lelon Huh, MD

## 2015-06-30 NOTE — Progress Notes (Signed)
   Subjective: 1 Day Post-Op Procedure(s) (LRB): KYPHOPLASTY L2 (N/A) Patient reports pain as 0 on 0-10 scale.   Patient is well, and has had no acute complaints or problems Denies any CP, SOB, ABD pain. We will continue therapy today.   Objective: Vital signs in last 24 hours: Temp:  [97.5 F (36.4 C)-100 F (37.8 C)] 97.5 F (36.4 C) (02/14 0756) Pulse Rate:  [55-75] 72 (02/14 0923) Resp:  [17-20] 18 (02/14 0756) BP: (124-174)/(57-70) 167/63 mmHg (02/14 0756) SpO2:  [93 %-100 %] 93 % (02/14 0923)  Intake/Output from previous day: 02/13 0701 - 02/14 0700 In: 48 [P.O.:240; I.V.:350] Out: 0  Intake/Output this shift:     Recent Labs  06/29/15 2128 06/30/15 0607  HGB 13.4 12.6*    Recent Labs  06/29/15 2128 06/30/15 0607  WBC 8.8 7.0  RBC 4.44 4.14*  HCT 41.2 38.4*  PLT 103* 102*    Recent Labs  06/29/15 2128 06/30/15 0607  NA  --  134*  K  --  3.7  CL  --  101  CO2  --  28  BUN  --  12  CREATININE 0.67 0.68  GLUCOSE  --  98  CALCIUM  --  7.7*   No results for input(s): LABPT, INR in the last 72 hours.  EXAM General - Patient is Alert and Confused  Lumbar spine - mild tenderness to palpation lumbar spine. Extremity - Neurovascular intact Sensation intact distally Intact pulses distally Dorsiflexion/Plantar flexion intact Dressing - dressing C/D/I and no drainage Motor Function - intact, moving foot and toes well on exam.   Past Medical History  Diagnosis Date  . Colon polyps   . Clotting disorder (HCC)     Thrombocytopenia  . Hyperlipidemia   . Alzheimer's disease 12/09/2009  . Hypertension   . COPD (chronic obstructive pulmonary disease) ([place])   . Cancer ([place])     malignant melanoma  . Tachycardia     Assessment/Plan:   1 Day Post-Op Procedure(s) (LRB): KYPHOPLASTY L2 (N/A) Active Problems:   Compression fracture of L2 (HCC)   Back pain   Porcelain gallbladder   Emphysema lung (HCC)   Abdominal aortic aneurysm (AAA) (HCC)  Pressure ulcer  Estimated body mass index is 23.1 kg/(m^2) as calculated from the following:   Height as of this encounter: 5\' 10"  (1.778 m).   Weight as of this encounter: 73.029 kg (161 lb). Advance diet Up with therapy , progress activity as tolerated Follow up with [place] ortho in 2 weeks   Weight-Bearing as tolerated D/C O2 and Pulse OX and try on Room Air  T. Rachelle Hora, PA-C [place] 06/30/2015, 9:54 AM

## 2015-06-30 NOTE — Discharge Instructions (Signed)
Diet: As you were doing prior to hospitalization   Shower:  May shower, do not submerge incision site under water.  Dressing:  You may change your dressing as needed. Change the dressing with sterile gauze dressing.    Activity:  Increase activity slowly as tolerated.  Weight Bearing:   Weight bearing as tolerated  To prevent constipation: you may use a stool softener such as -  Colace (over the counter) 100 mg by mouth twice a day  Drink plenty of fluids (prune juice may be helpful) and high fiber foods Miralax (over the counter) for constipation as needed.    Itching:  If you experience itching with your medications, try taking only a single pain pill, or even half a pain pill at a time.  You may take up to 10 pain pills per day, and you can also use benadryl over the counter for itching or also to help with sleep.   Precautions:  If you experience chest pain or shortness of breath - call 911 immediately for transfer to the hospital emergency department!!  If you develop a fever greater that 101 F, purulent drainage from wound, increased redness or drainage from wound, or calf pain-Call [place]                                              Follow- Up Appointment:  Please call for an appointment to be seen in 2 weeks at [facility]

## 2015-06-30 NOTE — NC FL2 (Signed)
[place] LEVEL OF CARE SCREENING TOOL     IDENTIFICATION  Patient Name: Adam Singleton Birthdate: 04-Jan-1929 Sex: male Admission Date (Current Location): 06/26/2015  [place] and [place] Number:  Engineering geologist and Address:  [facility], [address] [place], [place] [address]      Provider Number: [id number]  Attending Physician Name and Address:  Henreitta Leber, MD  Relative Name and Phone Number:       Current Level of Care: Hospital Recommended Level of Care: [place], Memory Care Prior Approval Number:    Date Approved/Denied:   PASRR Number: [id number]  Discharge Plan:  Memory Care Unit    Current Diagnoses: Primary: Dementia (Alzheimer's)  Patient Active Problem List   Diagnosis Date Noted  . Pressure ulcer 06/27/2015  . Compression fracture of L2 ([place]) 06/26/2015  . Back pain 06/26/2015  . 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 Emphysema lung ([place]) 06/26/2015  . Abdominal aortic aneurysm (AAA) ([place]) 06/26/2015  . Hyperglycemia 11/05/2014  . History of DVT (deep vein thrombosis) 11/05/2014  . 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 Personal history of PE (pulmonary embolism) 11/05/2014  . Obstruction to urinary outflow 11/05/2014  . History of colonic polyps 09/24/2014  . COPD (chronic obstructive pulmonary disease) ([place]) 09/24/2014  . H/O cardiovascular disorder 09/24/2014  . H/O Malignant melanoma 09/24/2014  . Prostate disease 09/24/2014  . Thrombocytopenia ([place]) 09/24/2014  . Alzheimer's dementia 12/09/2009  . Hyperlipidemia 12/09/2009  . Malignant neoplasm of scalp and skin of neck 11/09/2009    Orientation RESPIRATION BLADDER Height & Weight     Self   Oxygen 2 Liters Chronic Incontinent Weight: 161 lb (73.029 kg) Height:  5\' 10"  (177.8 cm)  BEHAVIORAL SYMPTOMS/MOOD NEUROLOGICAL BOWEL NUTRITION STATUS      Incontinent Diet: Heart Healthy   AMBULATORY STATUS COMMUNICATION OF NEEDS Skin   Limited  Assistance  Verbally Surgical wounds                       Personal Care Assistance Level of Assistance  Bathing, Feeding, Dressing Bathing assistance: Limited assistance Feeding assistance: Limited assistance Dressing assistance: Limited assistance     Functional Limitations Info  Sight, Hearing, Speech Sight Info: Adequate Hearing Info: Adequate Speech Info: Adequate    SPECIAL CARE FACTORS FREQUENCY  PT (By licensed PT)      Home Health PT 2 to 3 times per week.               Contractures      Additional Factors Info  Code Status, Allergies Code Status Info: DNR Allergies Info: Vancomycin Hcl           Discharge Medications: Please see discharge summary for a list of discharge medications. DISCHARGE MEDICATIONS:   Current Discharge Medication List    START taking these medications   Details  oxyCODONE (OXY IR/ROXICODONE) 5 MG immediate release tablet Take 1 tablet (5 mg total) by mouth every 4 (four) hours as needed for moderate pain. Qty: 30 tablet, Refills: 0      CONTINUE these medications which have NOT CHANGED   Details  acetaminophen (TYLENOL) 325 MG tablet Take 2 tablets by mouth every 4 (four) hours as needed. for mild pain or fever    aspirin 81 MG EC tablet Take 1 tablet by mouth daily.    budesonide-formoterol (SYMBICORT) 160-4.5 MCG/ACT inhaler Inhale 2 puffs into the lungs 2 (two) times daily. For chronic obstructive pulmonary  Dextromethorphan-Guaifenesin 10-100 MG/5ML liquid Take 5 mLs by mouth every 4 (four) hours as needed. For cough    donepezil (ARICEPT) 10 MG tablet Take 1 tablet by mouth at bedtime.    lovastatin (MEVACOR) 20 MG tablet Take 1 tablet by mouth daily.    memantine (NAMENDA) 10 MG tablet Take 1 tablet by mouth 2 (two) times daily.    Travoprost, BAK Free, (TRAVATAN) 0.004 % SOLN ophthalmic solution Apply 1 drop to eye at bedtime.           Relevant Imaging  Results:  Relevant Lab Results:   Additional Information [id number]  Loralyn Freshwater, LCSW

## 2015-06-30 NOTE — Progress Notes (Signed)
Per PT patient can return to [facility] PT is recommending home health with 24 hour assist. Clinical Education officer, museum ([place]) contacted Musician at [facility]. Per [name] she will have to come evaluate patient before he returns to [facility]. CSW made [name] aware that patient will likely D/C today. Per [name] she will come up to [facility] today. CSW contacted patient's son Ulice Dash and made him aware of above. CSW will continue to follow and assist as needed.   Blima Rich, LCSW [phone]

## 2015-06-30 NOTE — Evaluation (Signed)
Physical Therapy Evaluation Patient Details Name: Adam Singleton MRN: [id number] DOB: October 28, 1928 Today's Date: 06/30/2015   History of Present Illness  82 yo M pt arrived by EMS from [facility] w/ c/o of right hip pain. Patient normally ambulates w/o difficulty and was unable to get up. Staff say that pt has not reported falling and there was no bruising, swelling or abnormal appearance. Pt was found to have an L2 compression fx and is s/p kyphoplasty on 06/29/15.  Clinical Impression  Pt presents with L2 compression fx s/p kyphoplasty on 06/29/15 with deficits of strength, bed mobility, transfers, gait, balance and maintaining back precautions.  He is limited by advanced dementia and ability to follow commands. He required +2 assistance for all mobility due to cognition to maintain back precautions. Transferred from bed to chair with FWW and +2 minA for safety and constant cues for technique and safety. Able to ambulate very limited distances with FWW and +2 assistance for safety. Pt will benefit from skilled PT services to increase functional I, mobility and to return to PLOF.      Follow Up Recommendations Home health PT;Supervision/Assistance - 24 hour;Supervision for mobility/OOB    Equipment Recommendations  Rolling walker with 5" wheels (unsure is pt has this already)   Recommendations for Other Services       Precautions / Restrictions Precautions Precautions: Back;Fall Precaution Booklet Issued: No Precaution Comments: Discussed back precautions with pt but due to advanced dementia pt has poor recall of these. Restrictions Weight Bearing Restrictions: No      Mobility  Bed Mobility Overal bed mobility: +2 for physical assistance;Needs Assistance (difficulty following commands;+2 to maintain back precaution) Bed Mobility: Supine to Sit     Supine to sit: +2 for physical assistance;HOB elevated (poor cognition; +2 to maintain precautions)     General bed mobility comments:  Pt is fearful to perform bed mobility and is unable to follow commands for log rolling. +2 assistance required to maintain precautions.   Transfers Overall transfer level: Needs assistance Equipment used: Rolling walker (2 wheeled) Transfers: Sit to/from [facility] Sit to Stand: Mod assist Stand pivot transfers: +2 physical assistance (due to cognition and decreased ability to follow commands)       General transfer comment: +2 for safety; requires min/mod physical assistance; mainly constant cues for technique and safety  Ambulation/Gait Ambulation/Gait assistance: +2 physical assistance Ambulation Distance (Feet): 5 Feet Assistive device: Rolling walker (2 wheeled) Gait Pattern/deviations: Step-to pattern;Shuffle;Decreased stride length;Narrow base of support   Gait velocity interpretation: Below normal speed for age/gender General Gait Details: very slow cadence; difficulty following commands  Stairs            Wheelchair Mobility    Modified Rankin (Stroke Patients Only)       Balance Overall balance assessment: Needs assistance Sitting-balance support: Feet supported;Bilateral upper extremity supported Sitting balance-Leahy Scale: Fair Sitting balance - Comments: once pt is sitting EOB he is able to maintain balance independently   Standing balance support: Bilateral upper extremity supported Standing balance-Leahy Scale: Fair Standing balance comment: can maintain static balance independently; needs close assistance for safety                             Pertinent Vitals/Pain      Home Living Family/patient expects to be discharged to:: Assisted living                 Additional Comments:  Unsure at this time due to pt's advanced dementia.     Prior Function Level of Independence: Needs assistance   Gait / Transfers Assistance Needed: limited gait with RW and transfers with assistance in advanced memory care  ADL's /  Homemaking Assistance Needed: assistance from staff  Comments: advanced dementia; difficulty following commands     Hand Dominance        Extremity/Trunk Assessment   Upper Extremity Assessment: Generalized weakness;Difficult to assess due to impaired cognition           Lower Extremity Assessment: Generalized weakness;Difficult to assess due to impaired cognition         Communication   Communication: HOH  Cognition Arousal/Alertness: Awake/alert Behavior During Therapy: WFL for tasks assessed/performed Overall Cognitive Status: History of cognitive impairments - at baseline       Memory: Decreased recall of precautions;Decreased short-term memory              General Comments      Exercises Other Exercises Other Exercises: B LE therex: supine heel slides; seated LAQs, ankle pumps, hip add squeezes x10 each. Difficulty following commands despite verbal, visual and tactile cues. Limited in exercises due to cognition.      Assessment/Plan    PT Assessment Patient needs continued PT services  PT Diagnosis Difficulty walking;Generalized weakness   PT Problem List Decreased strength;Decreased activity tolerance;Decreased balance;Decreased cognition;Decreased safety awareness;Decreased knowledge of precautions  PT Treatment Interventions Gait training;Therapeutic activities;Therapeutic exercise;Balance training;Patient/family education   PT Goals (Current goals can be found in the Care Plan section) Acute Rehab PT Goals Patient Stated Goal: unable to state PT Goal Formulation: Patient unable to participate in goal setting Time For Goal Achievement: 07/14/15 Potential to Achieve Goals: Fair    Frequency 7X/week   Barriers to discharge        Co-evaluation               End of Session Equipment Utilized During Treatment: Gait belt;Oxygen Activity Tolerance: Patient tolerated treatment well Patient left: in chair;with call bell/phone within  reach;with chair alarm set Nurse Communication: Mobility status    Functional Assessment Tool Used: clinical judgement; cognition limits using functional assessment tools Functional Limitation: Mobility: Walking and moving around Mobility: Walking and Moving Around Current Status [id number]): At least 80 percent but less than 100 percent impaired, limited or restricted Mobility: Walking and Moving Around Goal Status [phone]): At least 40 percent but less than 60 percent impaired, limited or restricted    Time: 0840-0910 PT Time Calculation (min) (ACUTE ONLY): 30 min   Charges:   PT Evaluation $PT Eval Moderate Complexity: 1 Procedure PT Treatments $Therapeutic Exercise: 8-22 mins   PT G Codes:   PT G-Codes **NOT FOR INPATIENT CLASS** Functional Assessment Tool Used: clinical judgement; cognition limits using functional assessment tools Functional Limitation: Mobility: Walking and moving around Mobility: Walking and Moving Around Current Status [id number]): At least 80 percent but less than 100 percent impaired, limited or restricted Mobility: Walking and Moving Around Goal Status [id number]): At least 40 percent but less than 60 percent impaired, limited or restricted    Neoma Laming, PT, DPT  06/30/2015, 9:50 AM [phone]

## 2015-06-30 NOTE — Telephone Encounter (Signed)
Pt is scheduled for hospital f/u on 07/14/15. Pt is being discharged 06/30/15 and was there for Kyphoplasty L2. Thanks TNP

## 2015-06-30 NOTE — Care Management (Signed)
Order for OPPT requested from MD. Per Douglass Rivers patient has a walker. ALF will evaluate patient after lunch today.

## 2015-06-30 NOTE — Progress Notes (Signed)
[name] and [name] came from [place] to assess patient. Per [name] they will accept patient back and requested palliative care to follow. Clinical Social Worker (CSW) paged MD and asked him to put palliative to follow in D/C Summary. [facility] liaison is aware of above. CSW faxed D/C Summary, FL2 and prescriptions to [facility]. CSW also gave [name] a paper copy of D/C Summary, FL2 and prescriptions. CSW contacted patient's son Adam Singleton and made him aware of above. Adam Singleton is agreeable for palliative to follow patient at facility. Son requested EMS for transport. RN will arrange EMS. Please reconsult if future social work needs arise. CSW signing off.   Blima Rich, LCSW [phone]

## 2015-07-01 NOTE — Anesthesia Postprocedure Evaluation (Signed)
Anesthesia Post Note  Patient: Adam Singleton  Procedure(s) Performed: Procedure(s) (LRB): KYPHOPLASTY L2 (N/A)  Patient location during evaluation: PACU Anesthesia Type: General Level of consciousness: awake and alert and oriented Pain management: pain level controlled Vital Signs Assessment: post-procedure vital signs reviewed and stable Respiratory status: spontaneous breathing Cardiovascular status: blood pressure returned to baseline Anesthetic complications: no    Last Vitals:  Filed Vitals:   06/30/15 1507 06/30/15 1511  BP: 172/69 168/72  Pulse: 67   Temp: 36.9 C   Resp: 16     Last Pain:  Filed Vitals:   06/30/15 1511  PainSc: 0-No pain                 Caprisha Bridgett

## 2015-07-07 ENCOUNTER — Telehealth: Payer: Self-pay

## 2015-07-07 ENCOUNTER — Telehealth: Payer: Self-pay | Admitting: Family Medicine

## 2015-07-07 NOTE — Telephone Encounter (Signed)
Adam Singleton advised as below. Reports he will fax over the reason or diagnosis. sd

## 2015-07-07 NOTE — Telephone Encounter (Signed)
We need a reason or diagnosis for ordering PT.

## 2015-07-07 NOTE — Telephone Encounter (Signed)
Adam Singleton (PT) from [facility] called requesting orders for Adam Singleton for PT. Adam Singleton reports that Adam Singleton family is requesting PT for the pt.  Adam Singleton CB# [phone] Fax #  [phone].

## 2015-07-07 NOTE — Telephone Encounter (Signed)
Family is requesting palitive care treatment.  She is going to be faxing papers /orders to be signed.  Her call back is  [phone]  Thanks [name]

## 2015-07-13 DIAGNOSIS — J441 Chronic obstructive pulmonary disease with (acute) exacerbation: Secondary | ICD-10-CM | POA: Diagnosis not present

## 2015-07-13 DIAGNOSIS — S32009D Unspecified fracture of unspecified lumbar vertebra, subsequent encounter for fracture with routine healing: Secondary | ICD-10-CM | POA: Diagnosis not present

## 2015-07-13 DIAGNOSIS — E46 Unspecified protein-calorie malnutrition: Secondary | ICD-10-CM | POA: Diagnosis not present

## 2015-07-13 DIAGNOSIS — E785 Hyperlipidemia, unspecified: Secondary | ICD-10-CM | POA: Diagnosis not present

## 2015-07-13 DIAGNOSIS — R1310 Dysphagia, unspecified: Secondary | ICD-10-CM | POA: Diagnosis not present

## 2015-07-13 DIAGNOSIS — I714 Abdominal aortic aneurysm, without rupture: Secondary | ICD-10-CM | POA: Diagnosis not present

## 2015-07-13 DIAGNOSIS — F0280 Dementia in other diseases classified elsewhere without behavioral disturbance: Secondary | ICD-10-CM | POA: Diagnosis not present

## 2015-07-13 DIAGNOSIS — Z9981 Dependence on supplemental oxygen: Secondary | ICD-10-CM | POA: Diagnosis not present

## 2015-07-13 DIAGNOSIS — G301 Alzheimer's disease with late onset: Secondary | ICD-10-CM | POA: Diagnosis not present

## 2015-07-14 ENCOUNTER — Inpatient Hospital Stay: Payer: Medicare Other | Admitting: Family Medicine

## 2015-07-14 DIAGNOSIS — F0280 Dementia in other diseases classified elsewhere without behavioral disturbance: Secondary | ICD-10-CM | POA: Diagnosis not present

## 2015-07-14 DIAGNOSIS — G301 Alzheimer's disease with late onset: Secondary | ICD-10-CM | POA: Diagnosis not present

## 2015-07-14 DIAGNOSIS — R1310 Dysphagia, unspecified: Secondary | ICD-10-CM | POA: Diagnosis not present

## 2015-07-14 DIAGNOSIS — Z9981 Dependence on supplemental oxygen: Secondary | ICD-10-CM | POA: Diagnosis not present

## 2015-07-14 DIAGNOSIS — J441 Chronic obstructive pulmonary disease with (acute) exacerbation: Secondary | ICD-10-CM | POA: Diagnosis not present

## 2015-07-14 DIAGNOSIS — E46 Unspecified protein-calorie malnutrition: Secondary | ICD-10-CM | POA: Diagnosis not present

## 2015-07-15 DIAGNOSIS — Z9981 Dependence on supplemental oxygen: Secondary | ICD-10-CM | POA: Diagnosis not present

## 2015-07-15 DIAGNOSIS — S32009D Unspecified fracture of unspecified lumbar vertebra, subsequent encounter for fracture with routine healing: Secondary | ICD-10-CM | POA: Diagnosis not present

## 2015-07-15 DIAGNOSIS — E46 Unspecified protein-calorie malnutrition: Secondary | ICD-10-CM | POA: Diagnosis not present

## 2015-07-15 DIAGNOSIS — G301 Alzheimer's disease with late onset: Secondary | ICD-10-CM | POA: Diagnosis not present

## 2015-07-15 DIAGNOSIS — E785 Hyperlipidemia, unspecified: Secondary | ICD-10-CM | POA: Diagnosis not present

## 2015-07-15 DIAGNOSIS — F0280 Dementia in other diseases classified elsewhere without behavioral disturbance: Secondary | ICD-10-CM | POA: Diagnosis not present

## 2015-07-15 DIAGNOSIS — R1310 Dysphagia, unspecified: Secondary | ICD-10-CM | POA: Diagnosis not present

## 2015-07-15 DIAGNOSIS — J441 Chronic obstructive pulmonary disease with (acute) exacerbation: Secondary | ICD-10-CM | POA: Diagnosis not present

## 2015-07-15 DIAGNOSIS — I714 Abdominal aortic aneurysm, without rupture: Secondary | ICD-10-CM | POA: Diagnosis not present

## 2015-07-16 DIAGNOSIS — R1310 Dysphagia, unspecified: Secondary | ICD-10-CM | POA: Diagnosis not present

## 2015-07-16 DIAGNOSIS — G301 Alzheimer's disease with late onset: Secondary | ICD-10-CM | POA: Diagnosis not present

## 2015-07-16 DIAGNOSIS — E46 Unspecified protein-calorie malnutrition: Secondary | ICD-10-CM | POA: Diagnosis not present

## 2015-07-16 DIAGNOSIS — J441 Chronic obstructive pulmonary disease with (acute) exacerbation: Secondary | ICD-10-CM | POA: Diagnosis not present

## 2015-07-16 DIAGNOSIS — F0280 Dementia in other diseases classified elsewhere without behavioral disturbance: Secondary | ICD-10-CM | POA: Diagnosis not present

## 2015-07-16 DIAGNOSIS — Z9981 Dependence on supplemental oxygen: Secondary | ICD-10-CM | POA: Diagnosis not present

## 2015-07-17 DIAGNOSIS — G301 Alzheimer's disease with late onset: Secondary | ICD-10-CM | POA: Diagnosis not present

## 2015-07-17 DIAGNOSIS — R1310 Dysphagia, unspecified: Secondary | ICD-10-CM | POA: Diagnosis not present

## 2015-07-17 DIAGNOSIS — J441 Chronic obstructive pulmonary disease with (acute) exacerbation: Secondary | ICD-10-CM | POA: Diagnosis not present

## 2015-07-17 DIAGNOSIS — E46 Unspecified protein-calorie malnutrition: Secondary | ICD-10-CM | POA: Diagnosis not present

## 2015-07-17 DIAGNOSIS — F0280 Dementia in other diseases classified elsewhere without behavioral disturbance: Secondary | ICD-10-CM | POA: Diagnosis not present

## 2015-07-17 DIAGNOSIS — Z9981 Dependence on supplemental oxygen: Secondary | ICD-10-CM | POA: Diagnosis not present

## 2015-07-20 DIAGNOSIS — Z9981 Dependence on supplemental oxygen: Secondary | ICD-10-CM | POA: Diagnosis not present

## 2015-07-20 DIAGNOSIS — R1310 Dysphagia, unspecified: Secondary | ICD-10-CM | POA: Diagnosis not present

## 2015-07-20 DIAGNOSIS — G301 Alzheimer's disease with late onset: Secondary | ICD-10-CM | POA: Diagnosis not present

## 2015-07-20 DIAGNOSIS — J441 Chronic obstructive pulmonary disease with (acute) exacerbation: Secondary | ICD-10-CM | POA: Diagnosis not present

## 2015-07-20 DIAGNOSIS — E46 Unspecified protein-calorie malnutrition: Secondary | ICD-10-CM | POA: Diagnosis not present

## 2015-07-20 DIAGNOSIS — F0280 Dementia in other diseases classified elsewhere without behavioral disturbance: Secondary | ICD-10-CM | POA: Diagnosis not present

## 2015-07-21 ENCOUNTER — Other Ambulatory Visit: Payer: Self-pay | Admitting: *Deleted

## 2015-07-21 DIAGNOSIS — Z9981 Dependence on supplemental oxygen: Secondary | ICD-10-CM | POA: Diagnosis not present

## 2015-07-21 DIAGNOSIS — F0280 Dementia in other diseases classified elsewhere without behavioral disturbance: Secondary | ICD-10-CM | POA: Diagnosis not present

## 2015-07-21 DIAGNOSIS — E46 Unspecified protein-calorie malnutrition: Secondary | ICD-10-CM | POA: Diagnosis not present

## 2015-07-21 DIAGNOSIS — J441 Chronic obstructive pulmonary disease with (acute) exacerbation: Secondary | ICD-10-CM | POA: Diagnosis not present

## 2015-07-21 DIAGNOSIS — G301 Alzheimer's disease with late onset: Secondary | ICD-10-CM | POA: Diagnosis not present

## 2015-07-21 DIAGNOSIS — R1310 Dysphagia, unspecified: Secondary | ICD-10-CM | POA: Diagnosis not present

## 2015-07-21 MED ORDER — OXYCODONE HCL 5 MG PO TABS: 5.0000 mg | ORAL_TABLET | ORAL | Status: DC | PRN

## 2015-07-21 NOTE — Telephone Encounter (Signed)
Rx faxed to pharmacy  

## 2015-07-21 NOTE — Telephone Encounter (Signed)
Stacey from [facility] called office requesting rx for oxycodone 5 mg bid, every 4 hours prn. Requesting be sent faxed to pharmacy this morning. [facility] [phone].

## 2015-07-21 NOTE — Telephone Encounter (Signed)
rx printed, please fax as requested.

## 2015-07-22 DIAGNOSIS — Z9981 Dependence on supplemental oxygen: Secondary | ICD-10-CM | POA: Diagnosis not present

## 2015-07-22 DIAGNOSIS — J441 Chronic obstructive pulmonary disease with (acute) exacerbation: Secondary | ICD-10-CM | POA: Diagnosis not present

## 2015-07-22 DIAGNOSIS — F0280 Dementia in other diseases classified elsewhere without behavioral disturbance: Secondary | ICD-10-CM | POA: Diagnosis not present

## 2015-07-22 DIAGNOSIS — R1310 Dysphagia, unspecified: Secondary | ICD-10-CM | POA: Diagnosis not present

## 2015-07-22 DIAGNOSIS — E46 Unspecified protein-calorie malnutrition: Secondary | ICD-10-CM | POA: Diagnosis not present

## 2015-07-22 DIAGNOSIS — G301 Alzheimer's disease with late onset: Secondary | ICD-10-CM | POA: Diagnosis not present

## 2015-07-23 DIAGNOSIS — J441 Chronic obstructive pulmonary disease with (acute) exacerbation: Secondary | ICD-10-CM | POA: Diagnosis not present

## 2015-07-23 DIAGNOSIS — E46 Unspecified protein-calorie malnutrition: Secondary | ICD-10-CM | POA: Diagnosis not present

## 2015-07-23 DIAGNOSIS — R1310 Dysphagia, unspecified: Secondary | ICD-10-CM | POA: Diagnosis not present

## 2015-07-23 DIAGNOSIS — Z9981 Dependence on supplemental oxygen: Secondary | ICD-10-CM | POA: Diagnosis not present

## 2015-07-23 DIAGNOSIS — F0280 Dementia in other diseases classified elsewhere without behavioral disturbance: Secondary | ICD-10-CM | POA: Diagnosis not present

## 2015-07-23 DIAGNOSIS — G301 Alzheimer's disease with late onset: Secondary | ICD-10-CM | POA: Diagnosis not present

## 2015-07-24 ENCOUNTER — Encounter (INDEPENDENT_AMBULATORY_CARE_PROVIDER_SITE_OTHER): Admitting: Family Medicine

## 2015-07-24 DIAGNOSIS — J441 Chronic obstructive pulmonary disease with (acute) exacerbation: Secondary | ICD-10-CM

## 2015-07-24 DIAGNOSIS — R1310 Dysphagia, unspecified: Secondary | ICD-10-CM | POA: Diagnosis not present

## 2015-07-24 DIAGNOSIS — G301 Alzheimer's disease with late onset: Secondary | ICD-10-CM | POA: Diagnosis not present

## 2015-07-24 DIAGNOSIS — F0280 Dementia in other diseases classified elsewhere without behavioral disturbance: Secondary | ICD-10-CM | POA: Diagnosis not present

## 2015-07-24 DIAGNOSIS — E46 Unspecified protein-calorie malnutrition: Secondary | ICD-10-CM

## 2015-07-24 DIAGNOSIS — Z9981 Dependence on supplemental oxygen: Secondary | ICD-10-CM | POA: Diagnosis not present

## 2015-07-27 DIAGNOSIS — J441 Chronic obstructive pulmonary disease with (acute) exacerbation: Secondary | ICD-10-CM | POA: Diagnosis not present

## 2015-07-27 DIAGNOSIS — Z9981 Dependence on supplemental oxygen: Secondary | ICD-10-CM | POA: Diagnosis not present

## 2015-07-27 DIAGNOSIS — E46 Unspecified protein-calorie malnutrition: Secondary | ICD-10-CM | POA: Diagnosis not present

## 2015-07-27 DIAGNOSIS — G301 Alzheimer's disease with late onset: Secondary | ICD-10-CM | POA: Diagnosis not present

## 2015-07-27 DIAGNOSIS — R1310 Dysphagia, unspecified: Secondary | ICD-10-CM | POA: Diagnosis not present

## 2015-07-27 DIAGNOSIS — F0280 Dementia in other diseases classified elsewhere without behavioral disturbance: Secondary | ICD-10-CM | POA: Diagnosis not present

## 2015-07-28 DIAGNOSIS — Z9981 Dependence on supplemental oxygen: Secondary | ICD-10-CM | POA: Diagnosis not present

## 2015-07-28 DIAGNOSIS — E46 Unspecified protein-calorie malnutrition: Secondary | ICD-10-CM | POA: Diagnosis not present

## 2015-07-28 DIAGNOSIS — F0280 Dementia in other diseases classified elsewhere without behavioral disturbance: Secondary | ICD-10-CM | POA: Diagnosis not present

## 2015-07-28 DIAGNOSIS — J441 Chronic obstructive pulmonary disease with (acute) exacerbation: Secondary | ICD-10-CM | POA: Diagnosis not present

## 2015-07-28 DIAGNOSIS — G301 Alzheimer's disease with late onset: Secondary | ICD-10-CM | POA: Diagnosis not present

## 2015-07-28 DIAGNOSIS — R1310 Dysphagia, unspecified: Secondary | ICD-10-CM | POA: Diagnosis not present

## 2015-07-29 DIAGNOSIS — F0280 Dementia in other diseases classified elsewhere without behavioral disturbance: Secondary | ICD-10-CM | POA: Diagnosis not present

## 2015-07-29 DIAGNOSIS — Z9981 Dependence on supplemental oxygen: Secondary | ICD-10-CM | POA: Diagnosis not present

## 2015-07-29 DIAGNOSIS — E46 Unspecified protein-calorie malnutrition: Secondary | ICD-10-CM | POA: Diagnosis not present

## 2015-07-29 DIAGNOSIS — J441 Chronic obstructive pulmonary disease with (acute) exacerbation: Secondary | ICD-10-CM | POA: Diagnosis not present

## 2015-07-29 DIAGNOSIS — G301 Alzheimer's disease with late onset: Secondary | ICD-10-CM | POA: Diagnosis not present

## 2015-07-29 DIAGNOSIS — R1310 Dysphagia, unspecified: Secondary | ICD-10-CM | POA: Diagnosis not present

## 2015-07-30 DIAGNOSIS — Z9981 Dependence on supplemental oxygen: Secondary | ICD-10-CM | POA: Diagnosis not present

## 2015-07-30 DIAGNOSIS — J441 Chronic obstructive pulmonary disease with (acute) exacerbation: Secondary | ICD-10-CM | POA: Diagnosis not present

## 2015-07-30 DIAGNOSIS — G301 Alzheimer's disease with late onset: Secondary | ICD-10-CM | POA: Diagnosis not present

## 2015-07-30 DIAGNOSIS — E46 Unspecified protein-calorie malnutrition: Secondary | ICD-10-CM | POA: Diagnosis not present

## 2015-07-30 DIAGNOSIS — F0280 Dementia in other diseases classified elsewhere without behavioral disturbance: Secondary | ICD-10-CM | POA: Diagnosis not present

## 2015-07-30 DIAGNOSIS — R1310 Dysphagia, unspecified: Secondary | ICD-10-CM | POA: Diagnosis not present

## 2015-07-31 DIAGNOSIS — R1310 Dysphagia, unspecified: Secondary | ICD-10-CM | POA: Diagnosis not present

## 2015-07-31 DIAGNOSIS — Z9981 Dependence on supplemental oxygen: Secondary | ICD-10-CM | POA: Diagnosis not present

## 2015-07-31 DIAGNOSIS — J441 Chronic obstructive pulmonary disease with (acute) exacerbation: Secondary | ICD-10-CM | POA: Diagnosis not present

## 2015-07-31 DIAGNOSIS — G301 Alzheimer's disease with late onset: Secondary | ICD-10-CM | POA: Diagnosis not present

## 2015-07-31 DIAGNOSIS — E46 Unspecified protein-calorie malnutrition: Secondary | ICD-10-CM | POA: Diagnosis not present

## 2015-07-31 DIAGNOSIS — F0280 Dementia in other diseases classified elsewhere without behavioral disturbance: Secondary | ICD-10-CM | POA: Diagnosis not present

## 2015-08-02 DIAGNOSIS — J441 Chronic obstructive pulmonary disease with (acute) exacerbation: Secondary | ICD-10-CM | POA: Diagnosis not present

## 2015-08-02 DIAGNOSIS — G301 Alzheimer's disease with late onset: Secondary | ICD-10-CM | POA: Diagnosis not present

## 2015-08-02 DIAGNOSIS — R1310 Dysphagia, unspecified: Secondary | ICD-10-CM | POA: Diagnosis not present

## 2015-08-02 DIAGNOSIS — E46 Unspecified protein-calorie malnutrition: Secondary | ICD-10-CM | POA: Diagnosis not present

## 2015-08-02 DIAGNOSIS — F0280 Dementia in other diseases classified elsewhere without behavioral disturbance: Secondary | ICD-10-CM | POA: Diagnosis not present

## 2015-08-02 DIAGNOSIS — Z9981 Dependence on supplemental oxygen: Secondary | ICD-10-CM | POA: Diagnosis not present

## 2015-08-03 DIAGNOSIS — F0280 Dementia in other diseases classified elsewhere without behavioral disturbance: Secondary | ICD-10-CM | POA: Diagnosis not present

## 2015-08-03 DIAGNOSIS — G301 Alzheimer's disease with late onset: Secondary | ICD-10-CM | POA: Diagnosis not present

## 2015-08-03 DIAGNOSIS — R1310 Dysphagia, unspecified: Secondary | ICD-10-CM | POA: Diagnosis not present

## 2015-08-03 DIAGNOSIS — E46 Unspecified protein-calorie malnutrition: Secondary | ICD-10-CM | POA: Diagnosis not present

## 2015-08-03 DIAGNOSIS — J441 Chronic obstructive pulmonary disease with (acute) exacerbation: Secondary | ICD-10-CM | POA: Diagnosis not present

## 2015-08-03 DIAGNOSIS — Z9981 Dependence on supplemental oxygen: Secondary | ICD-10-CM | POA: Diagnosis not present

## 2015-08-04 ENCOUNTER — Ambulatory Visit: Payer: Medicare Other | Admitting: Family Medicine

## 2015-08-04 DIAGNOSIS — F0280 Dementia in other diseases classified elsewhere without behavioral disturbance: Secondary | ICD-10-CM | POA: Diagnosis not present

## 2015-08-04 DIAGNOSIS — J441 Chronic obstructive pulmonary disease with (acute) exacerbation: Secondary | ICD-10-CM | POA: Diagnosis not present

## 2015-08-04 DIAGNOSIS — R1310 Dysphagia, unspecified: Secondary | ICD-10-CM | POA: Diagnosis not present

## 2015-08-04 DIAGNOSIS — Z9981 Dependence on supplemental oxygen: Secondary | ICD-10-CM | POA: Diagnosis not present

## 2015-08-04 DIAGNOSIS — G301 Alzheimer's disease with late onset: Secondary | ICD-10-CM | POA: Diagnosis not present

## 2015-08-04 DIAGNOSIS — E46 Unspecified protein-calorie malnutrition: Secondary | ICD-10-CM | POA: Diagnosis not present

## 2015-08-05 DIAGNOSIS — Z9981 Dependence on supplemental oxygen: Secondary | ICD-10-CM | POA: Diagnosis not present

## 2015-08-05 DIAGNOSIS — G301 Alzheimer's disease with late onset: Secondary | ICD-10-CM | POA: Diagnosis not present

## 2015-08-05 DIAGNOSIS — R1310 Dysphagia, unspecified: Secondary | ICD-10-CM | POA: Diagnosis not present

## 2015-08-05 DIAGNOSIS — F0280 Dementia in other diseases classified elsewhere without behavioral disturbance: Secondary | ICD-10-CM | POA: Diagnosis not present

## 2015-08-05 DIAGNOSIS — J441 Chronic obstructive pulmonary disease with (acute) exacerbation: Secondary | ICD-10-CM | POA: Diagnosis not present

## 2015-08-05 DIAGNOSIS — E46 Unspecified protein-calorie malnutrition: Secondary | ICD-10-CM | POA: Diagnosis not present

## 2015-08-06 DIAGNOSIS — Z9981 Dependence on supplemental oxygen: Secondary | ICD-10-CM | POA: Diagnosis not present

## 2015-08-06 DIAGNOSIS — E46 Unspecified protein-calorie malnutrition: Secondary | ICD-10-CM | POA: Diagnosis not present

## 2015-08-06 DIAGNOSIS — R1310 Dysphagia, unspecified: Secondary | ICD-10-CM | POA: Diagnosis not present

## 2015-08-06 DIAGNOSIS — G301 Alzheimer's disease with late onset: Secondary | ICD-10-CM | POA: Diagnosis not present

## 2015-08-06 DIAGNOSIS — F0280 Dementia in other diseases classified elsewhere without behavioral disturbance: Secondary | ICD-10-CM | POA: Diagnosis not present

## 2015-08-06 DIAGNOSIS — J441 Chronic obstructive pulmonary disease with (acute) exacerbation: Secondary | ICD-10-CM | POA: Diagnosis not present

## 2015-08-07 ENCOUNTER — Encounter (INDEPENDENT_AMBULATORY_CARE_PROVIDER_SITE_OTHER): Admitting: Family Medicine

## 2015-08-07 DIAGNOSIS — G301 Alzheimer's disease with late onset: Secondary | ICD-10-CM | POA: Diagnosis not present

## 2015-08-07 DIAGNOSIS — E46 Unspecified protein-calorie malnutrition: Secondary | ICD-10-CM

## 2015-08-07 DIAGNOSIS — F0280 Dementia in other diseases classified elsewhere without behavioral disturbance: Secondary | ICD-10-CM | POA: Diagnosis not present

## 2015-08-07 DIAGNOSIS — J441 Chronic obstructive pulmonary disease with (acute) exacerbation: Secondary | ICD-10-CM | POA: Diagnosis not present

## 2015-08-07 DIAGNOSIS — R1310 Dysphagia, unspecified: Secondary | ICD-10-CM | POA: Diagnosis not present

## 2015-08-07 DIAGNOSIS — Z9981 Dependence on supplemental oxygen: Secondary | ICD-10-CM | POA: Diagnosis not present

## 2015-08-10 DIAGNOSIS — F0280 Dementia in other diseases classified elsewhere without behavioral disturbance: Secondary | ICD-10-CM | POA: Diagnosis not present

## 2015-08-10 DIAGNOSIS — E46 Unspecified protein-calorie malnutrition: Secondary | ICD-10-CM | POA: Diagnosis not present

## 2015-08-10 DIAGNOSIS — G301 Alzheimer's disease with late onset: Secondary | ICD-10-CM | POA: Diagnosis not present

## 2015-08-10 DIAGNOSIS — R1310 Dysphagia, unspecified: Secondary | ICD-10-CM | POA: Diagnosis not present

## 2015-08-10 DIAGNOSIS — Z9981 Dependence on supplemental oxygen: Secondary | ICD-10-CM | POA: Diagnosis not present

## 2015-08-10 DIAGNOSIS — J441 Chronic obstructive pulmonary disease with (acute) exacerbation: Secondary | ICD-10-CM | POA: Diagnosis not present

## 2015-08-11 DIAGNOSIS — F0280 Dementia in other diseases classified elsewhere without behavioral disturbance: Secondary | ICD-10-CM | POA: Diagnosis not present

## 2015-08-11 DIAGNOSIS — J441 Chronic obstructive pulmonary disease with (acute) exacerbation: Secondary | ICD-10-CM | POA: Diagnosis not present

## 2015-08-11 DIAGNOSIS — G301 Alzheimer's disease with late onset: Secondary | ICD-10-CM | POA: Diagnosis not present

## 2015-08-11 DIAGNOSIS — Z9981 Dependence on supplemental oxygen: Secondary | ICD-10-CM | POA: Diagnosis not present

## 2015-08-11 DIAGNOSIS — R1310 Dysphagia, unspecified: Secondary | ICD-10-CM | POA: Diagnosis not present

## 2015-08-11 DIAGNOSIS — E46 Unspecified protein-calorie malnutrition: Secondary | ICD-10-CM | POA: Diagnosis not present

## 2015-08-12 DIAGNOSIS — E46 Unspecified protein-calorie malnutrition: Secondary | ICD-10-CM | POA: Diagnosis not present

## 2015-08-12 DIAGNOSIS — Z9981 Dependence on supplemental oxygen: Secondary | ICD-10-CM | POA: Diagnosis not present

## 2015-08-12 DIAGNOSIS — R1310 Dysphagia, unspecified: Secondary | ICD-10-CM | POA: Diagnosis not present

## 2015-08-12 DIAGNOSIS — F0280 Dementia in other diseases classified elsewhere without behavioral disturbance: Secondary | ICD-10-CM | POA: Diagnosis not present

## 2015-08-12 DIAGNOSIS — G301 Alzheimer's disease with late onset: Secondary | ICD-10-CM | POA: Diagnosis not present

## 2015-08-12 DIAGNOSIS — J441 Chronic obstructive pulmonary disease with (acute) exacerbation: Secondary | ICD-10-CM | POA: Diagnosis not present

## 2015-08-13 ENCOUNTER — Other Ambulatory Visit: Payer: Self-pay | Admitting: Family Medicine

## 2015-08-13 DIAGNOSIS — Z9981 Dependence on supplemental oxygen: Secondary | ICD-10-CM | POA: Diagnosis not present

## 2015-08-13 DIAGNOSIS — F0280 Dementia in other diseases classified elsewhere without behavioral disturbance: Secondary | ICD-10-CM | POA: Diagnosis not present

## 2015-08-13 DIAGNOSIS — G301 Alzheimer's disease with late onset: Secondary | ICD-10-CM | POA: Diagnosis not present

## 2015-08-13 DIAGNOSIS — R1310 Dysphagia, unspecified: Secondary | ICD-10-CM | POA: Diagnosis not present

## 2015-08-13 DIAGNOSIS — E46 Unspecified protein-calorie malnutrition: Secondary | ICD-10-CM | POA: Diagnosis not present

## 2015-08-13 DIAGNOSIS — J441 Chronic obstructive pulmonary disease with (acute) exacerbation: Secondary | ICD-10-CM | POA: Diagnosis not present

## 2015-08-13 NOTE — Telephone Encounter (Signed)
Pt contacted office for refill request on the following medications:  oxyCODONE (OXY IR/ROXICODONE) 5 MG immediate release tablet.  Fax [phone]

## 2015-08-14 ENCOUNTER — Other Ambulatory Visit: Payer: Self-pay | Admitting: *Deleted

## 2015-08-14 DIAGNOSIS — E46 Unspecified protein-calorie malnutrition: Secondary | ICD-10-CM | POA: Diagnosis not present

## 2015-08-14 DIAGNOSIS — J441 Chronic obstructive pulmonary disease with (acute) exacerbation: Secondary | ICD-10-CM | POA: Diagnosis not present

## 2015-08-14 DIAGNOSIS — F0280 Dementia in other diseases classified elsewhere without behavioral disturbance: Secondary | ICD-10-CM | POA: Diagnosis not present

## 2015-08-14 DIAGNOSIS — R1310 Dysphagia, unspecified: Secondary | ICD-10-CM | POA: Diagnosis not present

## 2015-08-14 DIAGNOSIS — G301 Alzheimer's disease with late onset: Secondary | ICD-10-CM | POA: Diagnosis not present

## 2015-08-14 DIAGNOSIS — Z9981 Dependence on supplemental oxygen: Secondary | ICD-10-CM | POA: Diagnosis not present

## 2015-08-14 MED ORDER — OXYCODONE HCL 5 MG PO TABS: 5.0000 mg | ORAL_TABLET | ORAL | Status: DC | PRN

## 2015-08-14 NOTE — Telephone Encounter (Signed)
Request has already been sent to provider. 

## 2015-08-15 DIAGNOSIS — I714 Abdominal aortic aneurysm, without rupture: Secondary | ICD-10-CM | POA: Diagnosis not present

## 2015-08-15 DIAGNOSIS — G301 Alzheimer's disease with late onset: Secondary | ICD-10-CM | POA: Diagnosis not present

## 2015-08-15 DIAGNOSIS — R1310 Dysphagia, unspecified: Secondary | ICD-10-CM | POA: Diagnosis not present

## 2015-08-15 DIAGNOSIS — J441 Chronic obstructive pulmonary disease with (acute) exacerbation: Secondary | ICD-10-CM | POA: Diagnosis not present

## 2015-08-15 DIAGNOSIS — E785 Hyperlipidemia, unspecified: Secondary | ICD-10-CM | POA: Diagnosis not present

## 2015-08-15 DIAGNOSIS — S32009D Unspecified fracture of unspecified lumbar vertebra, subsequent encounter for fracture with routine healing: Secondary | ICD-10-CM | POA: Diagnosis not present

## 2015-08-15 DIAGNOSIS — Z9981 Dependence on supplemental oxygen: Secondary | ICD-10-CM | POA: Diagnosis not present

## 2015-08-15 DIAGNOSIS — F0280 Dementia in other diseases classified elsewhere without behavioral disturbance: Secondary | ICD-10-CM | POA: Diagnosis not present

## 2015-08-15 DIAGNOSIS — E46 Unspecified protein-calorie malnutrition: Secondary | ICD-10-CM | POA: Diagnosis not present

## 2015-08-17 DIAGNOSIS — J441 Chronic obstructive pulmonary disease with (acute) exacerbation: Secondary | ICD-10-CM | POA: Diagnosis not present

## 2015-08-17 DIAGNOSIS — E46 Unspecified protein-calorie malnutrition: Secondary | ICD-10-CM | POA: Diagnosis not present

## 2015-08-17 DIAGNOSIS — F0280 Dementia in other diseases classified elsewhere without behavioral disturbance: Secondary | ICD-10-CM | POA: Diagnosis not present

## 2015-08-17 DIAGNOSIS — R1310 Dysphagia, unspecified: Secondary | ICD-10-CM | POA: Diagnosis not present

## 2015-08-17 DIAGNOSIS — G301 Alzheimer's disease with late onset: Secondary | ICD-10-CM | POA: Diagnosis not present

## 2015-08-17 DIAGNOSIS — Z9981 Dependence on supplemental oxygen: Secondary | ICD-10-CM | POA: Diagnosis not present

## 2015-08-18 DIAGNOSIS — Z9981 Dependence on supplemental oxygen: Secondary | ICD-10-CM | POA: Diagnosis not present

## 2015-08-18 DIAGNOSIS — J441 Chronic obstructive pulmonary disease with (acute) exacerbation: Secondary | ICD-10-CM | POA: Diagnosis not present

## 2015-08-18 DIAGNOSIS — R1310 Dysphagia, unspecified: Secondary | ICD-10-CM | POA: Diagnosis not present

## 2015-08-18 DIAGNOSIS — E46 Unspecified protein-calorie malnutrition: Secondary | ICD-10-CM | POA: Diagnosis not present

## 2015-08-18 DIAGNOSIS — G301 Alzheimer's disease with late onset: Secondary | ICD-10-CM | POA: Diagnosis not present

## 2015-08-18 DIAGNOSIS — F0280 Dementia in other diseases classified elsewhere without behavioral disturbance: Secondary | ICD-10-CM | POA: Diagnosis not present

## 2015-08-19 DIAGNOSIS — R1310 Dysphagia, unspecified: Secondary | ICD-10-CM | POA: Diagnosis not present

## 2015-08-19 DIAGNOSIS — F0280 Dementia in other diseases classified elsewhere without behavioral disturbance: Secondary | ICD-10-CM | POA: Diagnosis not present

## 2015-08-19 DIAGNOSIS — E46 Unspecified protein-calorie malnutrition: Secondary | ICD-10-CM | POA: Diagnosis not present

## 2015-08-19 DIAGNOSIS — G301 Alzheimer's disease with late onset: Secondary | ICD-10-CM | POA: Diagnosis not present

## 2015-08-19 DIAGNOSIS — J441 Chronic obstructive pulmonary disease with (acute) exacerbation: Secondary | ICD-10-CM | POA: Diagnosis not present

## 2015-08-19 DIAGNOSIS — Z9981 Dependence on supplemental oxygen: Secondary | ICD-10-CM | POA: Diagnosis not present

## 2015-08-20 DIAGNOSIS — Z9981 Dependence on supplemental oxygen: Secondary | ICD-10-CM | POA: Diagnosis not present

## 2015-08-20 DIAGNOSIS — R1310 Dysphagia, unspecified: Secondary | ICD-10-CM | POA: Diagnosis not present

## 2015-08-20 DIAGNOSIS — J441 Chronic obstructive pulmonary disease with (acute) exacerbation: Secondary | ICD-10-CM | POA: Diagnosis not present

## 2015-08-20 DIAGNOSIS — G301 Alzheimer's disease with late onset: Secondary | ICD-10-CM | POA: Diagnosis not present

## 2015-08-20 DIAGNOSIS — F0280 Dementia in other diseases classified elsewhere without behavioral disturbance: Secondary | ICD-10-CM | POA: Diagnosis not present

## 2015-08-20 DIAGNOSIS — E46 Unspecified protein-calorie malnutrition: Secondary | ICD-10-CM | POA: Diagnosis not present

## 2015-08-21 DIAGNOSIS — R1310 Dysphagia, unspecified: Secondary | ICD-10-CM | POA: Diagnosis not present

## 2015-08-21 DIAGNOSIS — G301 Alzheimer's disease with late onset: Secondary | ICD-10-CM | POA: Diagnosis not present

## 2015-08-21 DIAGNOSIS — E46 Unspecified protein-calorie malnutrition: Secondary | ICD-10-CM | POA: Diagnosis not present

## 2015-08-21 DIAGNOSIS — Z9981 Dependence on supplemental oxygen: Secondary | ICD-10-CM | POA: Diagnosis not present

## 2015-08-21 DIAGNOSIS — F0280 Dementia in other diseases classified elsewhere without behavioral disturbance: Secondary | ICD-10-CM | POA: Diagnosis not present

## 2015-08-21 DIAGNOSIS — J441 Chronic obstructive pulmonary disease with (acute) exacerbation: Secondary | ICD-10-CM | POA: Diagnosis not present

## 2015-08-24 DIAGNOSIS — F0280 Dementia in other diseases classified elsewhere without behavioral disturbance: Secondary | ICD-10-CM | POA: Diagnosis not present

## 2015-08-24 DIAGNOSIS — J441 Chronic obstructive pulmonary disease with (acute) exacerbation: Secondary | ICD-10-CM | POA: Diagnosis not present

## 2015-08-24 DIAGNOSIS — Z9981 Dependence on supplemental oxygen: Secondary | ICD-10-CM | POA: Diagnosis not present

## 2015-08-24 DIAGNOSIS — G301 Alzheimer's disease with late onset: Secondary | ICD-10-CM | POA: Diagnosis not present

## 2015-08-24 DIAGNOSIS — R1310 Dysphagia, unspecified: Secondary | ICD-10-CM | POA: Diagnosis not present

## 2015-08-24 DIAGNOSIS — E46 Unspecified protein-calorie malnutrition: Secondary | ICD-10-CM | POA: Diagnosis not present

## 2015-08-25 DIAGNOSIS — E46 Unspecified protein-calorie malnutrition: Secondary | ICD-10-CM | POA: Diagnosis not present

## 2015-08-25 DIAGNOSIS — G301 Alzheimer's disease with late onset: Secondary | ICD-10-CM | POA: Diagnosis not present

## 2015-08-25 DIAGNOSIS — J441 Chronic obstructive pulmonary disease with (acute) exacerbation: Secondary | ICD-10-CM | POA: Diagnosis not present

## 2015-08-25 DIAGNOSIS — R1310 Dysphagia, unspecified: Secondary | ICD-10-CM | POA: Diagnosis not present

## 2015-08-25 DIAGNOSIS — F0280 Dementia in other diseases classified elsewhere without behavioral disturbance: Secondary | ICD-10-CM | POA: Diagnosis not present

## 2015-08-25 DIAGNOSIS — Z9981 Dependence on supplemental oxygen: Secondary | ICD-10-CM | POA: Diagnosis not present

## 2015-08-26 DIAGNOSIS — F0280 Dementia in other diseases classified elsewhere without behavioral disturbance: Secondary | ICD-10-CM | POA: Diagnosis not present

## 2015-08-26 DIAGNOSIS — R1310 Dysphagia, unspecified: Secondary | ICD-10-CM | POA: Diagnosis not present

## 2015-08-26 DIAGNOSIS — E46 Unspecified protein-calorie malnutrition: Secondary | ICD-10-CM | POA: Diagnosis not present

## 2015-08-26 DIAGNOSIS — J441 Chronic obstructive pulmonary disease with (acute) exacerbation: Secondary | ICD-10-CM | POA: Diagnosis not present

## 2015-08-26 DIAGNOSIS — Z9981 Dependence on supplemental oxygen: Secondary | ICD-10-CM | POA: Diagnosis not present

## 2015-08-26 DIAGNOSIS — G301 Alzheimer's disease with late onset: Secondary | ICD-10-CM | POA: Diagnosis not present

## 2015-08-27 DIAGNOSIS — G301 Alzheimer's disease with late onset: Secondary | ICD-10-CM | POA: Diagnosis not present

## 2015-08-27 DIAGNOSIS — J441 Chronic obstructive pulmonary disease with (acute) exacerbation: Secondary | ICD-10-CM | POA: Diagnosis not present

## 2015-08-27 DIAGNOSIS — R1310 Dysphagia, unspecified: Secondary | ICD-10-CM | POA: Diagnosis not present

## 2015-08-27 DIAGNOSIS — E46 Unspecified protein-calorie malnutrition: Secondary | ICD-10-CM | POA: Diagnosis not present

## 2015-08-27 DIAGNOSIS — F0280 Dementia in other diseases classified elsewhere without behavioral disturbance: Secondary | ICD-10-CM | POA: Diagnosis not present

## 2015-08-27 DIAGNOSIS — Z9981 Dependence on supplemental oxygen: Secondary | ICD-10-CM | POA: Diagnosis not present

## 2015-08-28 DIAGNOSIS — R1310 Dysphagia, unspecified: Secondary | ICD-10-CM | POA: Diagnosis not present

## 2015-08-28 DIAGNOSIS — G301 Alzheimer's disease with late onset: Secondary | ICD-10-CM | POA: Diagnosis not present

## 2015-08-28 DIAGNOSIS — E46 Unspecified protein-calorie malnutrition: Secondary | ICD-10-CM | POA: Diagnosis not present

## 2015-08-28 DIAGNOSIS — J441 Chronic obstructive pulmonary disease with (acute) exacerbation: Secondary | ICD-10-CM | POA: Diagnosis not present

## 2015-08-28 DIAGNOSIS — Z9981 Dependence on supplemental oxygen: Secondary | ICD-10-CM | POA: Diagnosis not present

## 2015-08-28 DIAGNOSIS — F0280 Dementia in other diseases classified elsewhere without behavioral disturbance: Secondary | ICD-10-CM | POA: Diagnosis not present

## 2015-08-31 DIAGNOSIS — F0280 Dementia in other diseases classified elsewhere without behavioral disturbance: Secondary | ICD-10-CM | POA: Diagnosis not present

## 2015-08-31 DIAGNOSIS — E46 Unspecified protein-calorie malnutrition: Secondary | ICD-10-CM | POA: Diagnosis not present

## 2015-08-31 DIAGNOSIS — G301 Alzheimer's disease with late onset: Secondary | ICD-10-CM | POA: Diagnosis not present

## 2015-08-31 DIAGNOSIS — R1310 Dysphagia, unspecified: Secondary | ICD-10-CM | POA: Diagnosis not present

## 2015-08-31 DIAGNOSIS — J441 Chronic obstructive pulmonary disease with (acute) exacerbation: Secondary | ICD-10-CM | POA: Diagnosis not present

## 2015-08-31 DIAGNOSIS — Z9981 Dependence on supplemental oxygen: Secondary | ICD-10-CM | POA: Diagnosis not present

## 2015-09-01 DIAGNOSIS — G301 Alzheimer's disease with late onset: Secondary | ICD-10-CM | POA: Diagnosis not present

## 2015-09-01 DIAGNOSIS — F0280 Dementia in other diseases classified elsewhere without behavioral disturbance: Secondary | ICD-10-CM | POA: Diagnosis not present

## 2015-09-01 DIAGNOSIS — Z9981 Dependence on supplemental oxygen: Secondary | ICD-10-CM | POA: Diagnosis not present

## 2015-09-01 DIAGNOSIS — J441 Chronic obstructive pulmonary disease with (acute) exacerbation: Secondary | ICD-10-CM | POA: Diagnosis not present

## 2015-09-01 DIAGNOSIS — E46 Unspecified protein-calorie malnutrition: Secondary | ICD-10-CM | POA: Diagnosis not present

## 2015-09-01 DIAGNOSIS — R1310 Dysphagia, unspecified: Secondary | ICD-10-CM | POA: Diagnosis not present

## 2015-09-02 DIAGNOSIS — R1310 Dysphagia, unspecified: Secondary | ICD-10-CM | POA: Diagnosis not present

## 2015-09-02 DIAGNOSIS — Z9981 Dependence on supplemental oxygen: Secondary | ICD-10-CM | POA: Diagnosis not present

## 2015-09-02 DIAGNOSIS — G301 Alzheimer's disease with late onset: Secondary | ICD-10-CM | POA: Diagnosis not present

## 2015-09-02 DIAGNOSIS — J441 Chronic obstructive pulmonary disease with (acute) exacerbation: Secondary | ICD-10-CM | POA: Diagnosis not present

## 2015-09-02 DIAGNOSIS — E46 Unspecified protein-calorie malnutrition: Secondary | ICD-10-CM | POA: Diagnosis not present

## 2015-09-02 DIAGNOSIS — F0280 Dementia in other diseases classified elsewhere without behavioral disturbance: Secondary | ICD-10-CM | POA: Diagnosis not present

## 2015-09-03 DIAGNOSIS — J441 Chronic obstructive pulmonary disease with (acute) exacerbation: Secondary | ICD-10-CM | POA: Diagnosis not present

## 2015-09-03 DIAGNOSIS — R1310 Dysphagia, unspecified: Secondary | ICD-10-CM | POA: Diagnosis not present

## 2015-09-03 DIAGNOSIS — F0280 Dementia in other diseases classified elsewhere without behavioral disturbance: Secondary | ICD-10-CM | POA: Diagnosis not present

## 2015-09-03 DIAGNOSIS — E46 Unspecified protein-calorie malnutrition: Secondary | ICD-10-CM | POA: Diagnosis not present

## 2015-09-03 DIAGNOSIS — G301 Alzheimer's disease with late onset: Secondary | ICD-10-CM | POA: Diagnosis not present

## 2015-09-03 DIAGNOSIS — Z9981 Dependence on supplemental oxygen: Secondary | ICD-10-CM | POA: Diagnosis not present

## 2015-09-04 DIAGNOSIS — E46 Unspecified protein-calorie malnutrition: Secondary | ICD-10-CM | POA: Diagnosis not present

## 2015-09-04 DIAGNOSIS — F0280 Dementia in other diseases classified elsewhere without behavioral disturbance: Secondary | ICD-10-CM | POA: Diagnosis not present

## 2015-09-04 DIAGNOSIS — J441 Chronic obstructive pulmonary disease with (acute) exacerbation: Secondary | ICD-10-CM | POA: Diagnosis not present

## 2015-09-04 DIAGNOSIS — G301 Alzheimer's disease with late onset: Secondary | ICD-10-CM | POA: Diagnosis not present

## 2015-09-04 DIAGNOSIS — R1310 Dysphagia, unspecified: Secondary | ICD-10-CM | POA: Diagnosis not present

## 2015-09-04 DIAGNOSIS — Z9981 Dependence on supplemental oxygen: Secondary | ICD-10-CM | POA: Diagnosis not present

## 2015-09-07 DIAGNOSIS — Z9981 Dependence on supplemental oxygen: Secondary | ICD-10-CM | POA: Diagnosis not present

## 2015-09-07 DIAGNOSIS — G301 Alzheimer's disease with late onset: Secondary | ICD-10-CM | POA: Diagnosis not present

## 2015-09-07 DIAGNOSIS — F0280 Dementia in other diseases classified elsewhere without behavioral disturbance: Secondary | ICD-10-CM | POA: Diagnosis not present

## 2015-09-07 DIAGNOSIS — R1310 Dysphagia, unspecified: Secondary | ICD-10-CM | POA: Diagnosis not present

## 2015-09-07 DIAGNOSIS — J441 Chronic obstructive pulmonary disease with (acute) exacerbation: Secondary | ICD-10-CM | POA: Diagnosis not present

## 2015-09-07 DIAGNOSIS — E46 Unspecified protein-calorie malnutrition: Secondary | ICD-10-CM | POA: Diagnosis not present

## 2015-09-08 DIAGNOSIS — Z9981 Dependence on supplemental oxygen: Secondary | ICD-10-CM | POA: Diagnosis not present

## 2015-09-08 DIAGNOSIS — F0280 Dementia in other diseases classified elsewhere without behavioral disturbance: Secondary | ICD-10-CM | POA: Diagnosis not present

## 2015-09-08 DIAGNOSIS — E46 Unspecified protein-calorie malnutrition: Secondary | ICD-10-CM | POA: Diagnosis not present

## 2015-09-08 DIAGNOSIS — J441 Chronic obstructive pulmonary disease with (acute) exacerbation: Secondary | ICD-10-CM | POA: Diagnosis not present

## 2015-09-08 DIAGNOSIS — R1310 Dysphagia, unspecified: Secondary | ICD-10-CM | POA: Diagnosis not present

## 2015-09-08 DIAGNOSIS — G301 Alzheimer's disease with late onset: Secondary | ICD-10-CM | POA: Diagnosis not present

## 2015-09-09 DIAGNOSIS — E46 Unspecified protein-calorie malnutrition: Secondary | ICD-10-CM | POA: Diagnosis not present

## 2015-09-09 DIAGNOSIS — R1310 Dysphagia, unspecified: Secondary | ICD-10-CM | POA: Diagnosis not present

## 2015-09-09 DIAGNOSIS — F0280 Dementia in other diseases classified elsewhere without behavioral disturbance: Secondary | ICD-10-CM | POA: Diagnosis not present

## 2015-09-09 DIAGNOSIS — G301 Alzheimer's disease with late onset: Secondary | ICD-10-CM | POA: Diagnosis not present

## 2015-09-09 DIAGNOSIS — Z9981 Dependence on supplemental oxygen: Secondary | ICD-10-CM | POA: Diagnosis not present

## 2015-09-09 DIAGNOSIS — J441 Chronic obstructive pulmonary disease with (acute) exacerbation: Secondary | ICD-10-CM | POA: Diagnosis not present

## 2015-09-10 DIAGNOSIS — F0280 Dementia in other diseases classified elsewhere without behavioral disturbance: Secondary | ICD-10-CM | POA: Diagnosis not present

## 2015-09-10 DIAGNOSIS — Z9981 Dependence on supplemental oxygen: Secondary | ICD-10-CM | POA: Diagnosis not present

## 2015-09-10 DIAGNOSIS — R1310 Dysphagia, unspecified: Secondary | ICD-10-CM | POA: Diagnosis not present

## 2015-09-10 DIAGNOSIS — E46 Unspecified protein-calorie malnutrition: Secondary | ICD-10-CM | POA: Diagnosis not present

## 2015-09-10 DIAGNOSIS — J441 Chronic obstructive pulmonary disease with (acute) exacerbation: Secondary | ICD-10-CM | POA: Diagnosis not present

## 2015-09-10 DIAGNOSIS — G301 Alzheimer's disease with late onset: Secondary | ICD-10-CM | POA: Diagnosis not present

## 2015-09-11 DIAGNOSIS — G301 Alzheimer's disease with late onset: Secondary | ICD-10-CM | POA: Diagnosis not present

## 2015-09-11 DIAGNOSIS — F0280 Dementia in other diseases classified elsewhere without behavioral disturbance: Secondary | ICD-10-CM | POA: Diagnosis not present

## 2015-09-11 DIAGNOSIS — J441 Chronic obstructive pulmonary disease with (acute) exacerbation: Secondary | ICD-10-CM | POA: Diagnosis not present

## 2015-09-11 DIAGNOSIS — Z9981 Dependence on supplemental oxygen: Secondary | ICD-10-CM | POA: Diagnosis not present

## 2015-09-11 DIAGNOSIS — R1310 Dysphagia, unspecified: Secondary | ICD-10-CM | POA: Diagnosis not present

## 2015-09-11 DIAGNOSIS — E46 Unspecified protein-calorie malnutrition: Secondary | ICD-10-CM | POA: Diagnosis not present

## 2015-09-14 ENCOUNTER — Other Ambulatory Visit: Payer: Self-pay | Admitting: *Deleted

## 2015-09-14 DIAGNOSIS — E785 Hyperlipidemia, unspecified: Secondary | ICD-10-CM | POA: Diagnosis not present

## 2015-09-14 DIAGNOSIS — Z9981 Dependence on supplemental oxygen: Secondary | ICD-10-CM | POA: Diagnosis not present

## 2015-09-14 DIAGNOSIS — E46 Unspecified protein-calorie malnutrition: Secondary | ICD-10-CM | POA: Diagnosis not present

## 2015-09-14 DIAGNOSIS — R1310 Dysphagia, unspecified: Secondary | ICD-10-CM | POA: Diagnosis not present

## 2015-09-14 DIAGNOSIS — F0280 Dementia in other diseases classified elsewhere without behavioral disturbance: Secondary | ICD-10-CM | POA: Diagnosis not present

## 2015-09-14 DIAGNOSIS — G301 Alzheimer's disease with late onset: Secondary | ICD-10-CM | POA: Diagnosis not present

## 2015-09-14 DIAGNOSIS — S32009D Unspecified fracture of unspecified lumbar vertebra, subsequent encounter for fracture with routine healing: Secondary | ICD-10-CM | POA: Diagnosis not present

## 2015-09-14 DIAGNOSIS — J441 Chronic obstructive pulmonary disease with (acute) exacerbation: Secondary | ICD-10-CM | POA: Diagnosis not present

## 2015-09-14 DIAGNOSIS — I714 Abdominal aortic aneurysm, without rupture: Secondary | ICD-10-CM | POA: Diagnosis not present

## 2015-09-15 ENCOUNTER — Other Ambulatory Visit: Payer: Self-pay | Admitting: Family Medicine

## 2015-09-15 DIAGNOSIS — F0280 Dementia in other diseases classified elsewhere without behavioral disturbance: Secondary | ICD-10-CM | POA: Diagnosis not present

## 2015-09-15 DIAGNOSIS — Z9981 Dependence on supplemental oxygen: Secondary | ICD-10-CM | POA: Diagnosis not present

## 2015-09-15 DIAGNOSIS — G301 Alzheimer's disease with late onset: Secondary | ICD-10-CM | POA: Diagnosis not present

## 2015-09-15 DIAGNOSIS — J441 Chronic obstructive pulmonary disease with (acute) exacerbation: Secondary | ICD-10-CM | POA: Diagnosis not present

## 2015-09-15 DIAGNOSIS — R1310 Dysphagia, unspecified: Secondary | ICD-10-CM | POA: Diagnosis not present

## 2015-09-15 DIAGNOSIS — E46 Unspecified protein-calorie malnutrition: Secondary | ICD-10-CM | POA: Diagnosis not present

## 2015-09-15 MED ORDER — OXYCODONE HCL 5 MG PO TABS: 5.0000 mg | ORAL_TABLET | ORAL | Status: DC | PRN

## 2015-09-16 DIAGNOSIS — E46 Unspecified protein-calorie malnutrition: Secondary | ICD-10-CM | POA: Diagnosis not present

## 2015-09-16 DIAGNOSIS — R1310 Dysphagia, unspecified: Secondary | ICD-10-CM | POA: Diagnosis not present

## 2015-09-16 DIAGNOSIS — Z9981 Dependence on supplemental oxygen: Secondary | ICD-10-CM | POA: Diagnosis not present

## 2015-09-16 DIAGNOSIS — J441 Chronic obstructive pulmonary disease with (acute) exacerbation: Secondary | ICD-10-CM | POA: Diagnosis not present

## 2015-09-16 DIAGNOSIS — G301 Alzheimer's disease with late onset: Secondary | ICD-10-CM | POA: Diagnosis not present

## 2015-09-16 DIAGNOSIS — F0280 Dementia in other diseases classified elsewhere without behavioral disturbance: Secondary | ICD-10-CM | POA: Diagnosis not present

## 2015-09-17 DIAGNOSIS — Z9981 Dependence on supplemental oxygen: Secondary | ICD-10-CM | POA: Diagnosis not present

## 2015-09-17 DIAGNOSIS — R1310 Dysphagia, unspecified: Secondary | ICD-10-CM | POA: Diagnosis not present

## 2015-09-17 DIAGNOSIS — F0280 Dementia in other diseases classified elsewhere without behavioral disturbance: Secondary | ICD-10-CM | POA: Diagnosis not present

## 2015-09-17 DIAGNOSIS — E46 Unspecified protein-calorie malnutrition: Secondary | ICD-10-CM | POA: Diagnosis not present

## 2015-09-17 DIAGNOSIS — J441 Chronic obstructive pulmonary disease with (acute) exacerbation: Secondary | ICD-10-CM | POA: Diagnosis not present

## 2015-09-17 DIAGNOSIS — G301 Alzheimer's disease with late onset: Secondary | ICD-10-CM | POA: Diagnosis not present

## 2015-09-18 DIAGNOSIS — E46 Unspecified protein-calorie malnutrition: Secondary | ICD-10-CM | POA: Diagnosis not present

## 2015-09-18 DIAGNOSIS — G301 Alzheimer's disease with late onset: Secondary | ICD-10-CM | POA: Diagnosis not present

## 2015-09-18 DIAGNOSIS — Z9981 Dependence on supplemental oxygen: Secondary | ICD-10-CM | POA: Diagnosis not present

## 2015-09-18 DIAGNOSIS — F0280 Dementia in other diseases classified elsewhere without behavioral disturbance: Secondary | ICD-10-CM | POA: Diagnosis not present

## 2015-09-18 DIAGNOSIS — R1310 Dysphagia, unspecified: Secondary | ICD-10-CM | POA: Diagnosis not present

## 2015-09-18 DIAGNOSIS — J441 Chronic obstructive pulmonary disease with (acute) exacerbation: Secondary | ICD-10-CM | POA: Diagnosis not present

## 2015-09-21 DIAGNOSIS — E46 Unspecified protein-calorie malnutrition: Secondary | ICD-10-CM | POA: Diagnosis not present

## 2015-09-21 DIAGNOSIS — G301 Alzheimer's disease with late onset: Secondary | ICD-10-CM | POA: Diagnosis not present

## 2015-09-21 DIAGNOSIS — Z9981 Dependence on supplemental oxygen: Secondary | ICD-10-CM | POA: Diagnosis not present

## 2015-09-21 DIAGNOSIS — R1310 Dysphagia, unspecified: Secondary | ICD-10-CM | POA: Diagnosis not present

## 2015-09-21 DIAGNOSIS — F0280 Dementia in other diseases classified elsewhere without behavioral disturbance: Secondary | ICD-10-CM | POA: Diagnosis not present

## 2015-09-21 DIAGNOSIS — J441 Chronic obstructive pulmonary disease with (acute) exacerbation: Secondary | ICD-10-CM | POA: Diagnosis not present

## 2015-09-22 DIAGNOSIS — Z9981 Dependence on supplemental oxygen: Secondary | ICD-10-CM | POA: Diagnosis not present

## 2015-09-22 DIAGNOSIS — E46 Unspecified protein-calorie malnutrition: Secondary | ICD-10-CM | POA: Diagnosis not present

## 2015-09-22 DIAGNOSIS — J441 Chronic obstructive pulmonary disease with (acute) exacerbation: Secondary | ICD-10-CM | POA: Diagnosis not present

## 2015-09-22 DIAGNOSIS — G301 Alzheimer's disease with late onset: Secondary | ICD-10-CM | POA: Diagnosis not present

## 2015-09-22 DIAGNOSIS — R1310 Dysphagia, unspecified: Secondary | ICD-10-CM | POA: Diagnosis not present

## 2015-09-22 DIAGNOSIS — F0280 Dementia in other diseases classified elsewhere without behavioral disturbance: Secondary | ICD-10-CM | POA: Diagnosis not present

## 2015-09-23 DIAGNOSIS — R1310 Dysphagia, unspecified: Secondary | ICD-10-CM | POA: Diagnosis not present

## 2015-09-23 DIAGNOSIS — J441 Chronic obstructive pulmonary disease with (acute) exacerbation: Secondary | ICD-10-CM | POA: Diagnosis not present

## 2015-09-23 DIAGNOSIS — Z9981 Dependence on supplemental oxygen: Secondary | ICD-10-CM | POA: Diagnosis not present

## 2015-09-23 DIAGNOSIS — G301 Alzheimer's disease with late onset: Secondary | ICD-10-CM | POA: Diagnosis not present

## 2015-09-23 DIAGNOSIS — F0280 Dementia in other diseases classified elsewhere without behavioral disturbance: Secondary | ICD-10-CM | POA: Diagnosis not present

## 2015-09-23 DIAGNOSIS — E46 Unspecified protein-calorie malnutrition: Secondary | ICD-10-CM | POA: Diagnosis not present

## 2015-09-24 DIAGNOSIS — E46 Unspecified protein-calorie malnutrition: Secondary | ICD-10-CM | POA: Diagnosis not present

## 2015-09-24 DIAGNOSIS — F0280 Dementia in other diseases classified elsewhere without behavioral disturbance: Secondary | ICD-10-CM | POA: Diagnosis not present

## 2015-09-24 DIAGNOSIS — Z9981 Dependence on supplemental oxygen: Secondary | ICD-10-CM | POA: Diagnosis not present

## 2015-09-24 DIAGNOSIS — R1310 Dysphagia, unspecified: Secondary | ICD-10-CM | POA: Diagnosis not present

## 2015-09-24 DIAGNOSIS — J441 Chronic obstructive pulmonary disease with (acute) exacerbation: Secondary | ICD-10-CM | POA: Diagnosis not present

## 2015-09-24 DIAGNOSIS — G301 Alzheimer's disease with late onset: Secondary | ICD-10-CM | POA: Diagnosis not present

## 2015-09-25 DIAGNOSIS — F0280 Dementia in other diseases classified elsewhere without behavioral disturbance: Secondary | ICD-10-CM | POA: Diagnosis not present

## 2015-09-25 DIAGNOSIS — R1310 Dysphagia, unspecified: Secondary | ICD-10-CM | POA: Diagnosis not present

## 2015-09-25 DIAGNOSIS — J441 Chronic obstructive pulmonary disease with (acute) exacerbation: Secondary | ICD-10-CM | POA: Diagnosis not present

## 2015-09-25 DIAGNOSIS — Z9981 Dependence on supplemental oxygen: Secondary | ICD-10-CM | POA: Diagnosis not present

## 2015-09-25 DIAGNOSIS — E46 Unspecified protein-calorie malnutrition: Secondary | ICD-10-CM | POA: Diagnosis not present

## 2015-09-25 DIAGNOSIS — G301 Alzheimer's disease with late onset: Secondary | ICD-10-CM | POA: Diagnosis not present

## 2015-09-28 DIAGNOSIS — Z9981 Dependence on supplemental oxygen: Secondary | ICD-10-CM | POA: Diagnosis not present

## 2015-09-28 DIAGNOSIS — F0280 Dementia in other diseases classified elsewhere without behavioral disturbance: Secondary | ICD-10-CM | POA: Diagnosis not present

## 2015-09-28 DIAGNOSIS — G301 Alzheimer's disease with late onset: Secondary | ICD-10-CM | POA: Diagnosis not present

## 2015-09-28 DIAGNOSIS — E46 Unspecified protein-calorie malnutrition: Secondary | ICD-10-CM | POA: Diagnosis not present

## 2015-09-28 DIAGNOSIS — R1310 Dysphagia, unspecified: Secondary | ICD-10-CM | POA: Diagnosis not present

## 2015-09-28 DIAGNOSIS — J441 Chronic obstructive pulmonary disease with (acute) exacerbation: Secondary | ICD-10-CM | POA: Diagnosis not present

## 2015-09-29 DIAGNOSIS — E46 Unspecified protein-calorie malnutrition: Secondary | ICD-10-CM | POA: Diagnosis not present

## 2015-09-29 DIAGNOSIS — J441 Chronic obstructive pulmonary disease with (acute) exacerbation: Secondary | ICD-10-CM | POA: Diagnosis not present

## 2015-09-29 DIAGNOSIS — R1310 Dysphagia, unspecified: Secondary | ICD-10-CM | POA: Diagnosis not present

## 2015-09-29 DIAGNOSIS — G301 Alzheimer's disease with late onset: Secondary | ICD-10-CM | POA: Diagnosis not present

## 2015-09-29 DIAGNOSIS — Z9981 Dependence on supplemental oxygen: Secondary | ICD-10-CM | POA: Diagnosis not present

## 2015-09-29 DIAGNOSIS — F0280 Dementia in other diseases classified elsewhere without behavioral disturbance: Secondary | ICD-10-CM | POA: Diagnosis not present

## 2015-09-30 DIAGNOSIS — J441 Chronic obstructive pulmonary disease with (acute) exacerbation: Secondary | ICD-10-CM | POA: Diagnosis not present

## 2015-09-30 DIAGNOSIS — F0280 Dementia in other diseases classified elsewhere without behavioral disturbance: Secondary | ICD-10-CM | POA: Diagnosis not present

## 2015-09-30 DIAGNOSIS — G301 Alzheimer's disease with late onset: Secondary | ICD-10-CM | POA: Diagnosis not present

## 2015-09-30 DIAGNOSIS — Z9981 Dependence on supplemental oxygen: Secondary | ICD-10-CM | POA: Diagnosis not present

## 2015-09-30 DIAGNOSIS — E46 Unspecified protein-calorie malnutrition: Secondary | ICD-10-CM | POA: Diagnosis not present

## 2015-09-30 DIAGNOSIS — R1310 Dysphagia, unspecified: Secondary | ICD-10-CM | POA: Diagnosis not present

## 2015-10-01 DIAGNOSIS — G301 Alzheimer's disease with late onset: Secondary | ICD-10-CM | POA: Diagnosis not present

## 2015-10-01 DIAGNOSIS — Z9981 Dependence on supplemental oxygen: Secondary | ICD-10-CM | POA: Diagnosis not present

## 2015-10-01 DIAGNOSIS — R1310 Dysphagia, unspecified: Secondary | ICD-10-CM | POA: Diagnosis not present

## 2015-10-01 DIAGNOSIS — J441 Chronic obstructive pulmonary disease with (acute) exacerbation: Secondary | ICD-10-CM | POA: Diagnosis not present

## 2015-10-01 DIAGNOSIS — E46 Unspecified protein-calorie malnutrition: Secondary | ICD-10-CM | POA: Diagnosis not present

## 2015-10-01 DIAGNOSIS — F0280 Dementia in other diseases classified elsewhere without behavioral disturbance: Secondary | ICD-10-CM | POA: Diagnosis not present

## 2015-10-02 DIAGNOSIS — R1310 Dysphagia, unspecified: Secondary | ICD-10-CM | POA: Diagnosis not present

## 2015-10-02 DIAGNOSIS — E46 Unspecified protein-calorie malnutrition: Secondary | ICD-10-CM | POA: Diagnosis not present

## 2015-10-02 DIAGNOSIS — Z9981 Dependence on supplemental oxygen: Secondary | ICD-10-CM | POA: Diagnosis not present

## 2015-10-02 DIAGNOSIS — F0280 Dementia in other diseases classified elsewhere without behavioral disturbance: Secondary | ICD-10-CM | POA: Diagnosis not present

## 2015-10-02 DIAGNOSIS — G301 Alzheimer's disease with late onset: Secondary | ICD-10-CM | POA: Diagnosis not present

## 2015-10-02 DIAGNOSIS — J441 Chronic obstructive pulmonary disease with (acute) exacerbation: Secondary | ICD-10-CM | POA: Diagnosis not present

## 2015-10-05 DIAGNOSIS — G301 Alzheimer's disease with late onset: Secondary | ICD-10-CM | POA: Diagnosis not present

## 2015-10-05 DIAGNOSIS — J441 Chronic obstructive pulmonary disease with (acute) exacerbation: Secondary | ICD-10-CM | POA: Diagnosis not present

## 2015-10-05 DIAGNOSIS — F0280 Dementia in other diseases classified elsewhere without behavioral disturbance: Secondary | ICD-10-CM | POA: Diagnosis not present

## 2015-10-05 DIAGNOSIS — R1310 Dysphagia, unspecified: Secondary | ICD-10-CM | POA: Diagnosis not present

## 2015-10-05 DIAGNOSIS — E46 Unspecified protein-calorie malnutrition: Secondary | ICD-10-CM | POA: Diagnosis not present

## 2015-10-05 DIAGNOSIS — Z9981 Dependence on supplemental oxygen: Secondary | ICD-10-CM | POA: Diagnosis not present

## 2015-10-06 DIAGNOSIS — J441 Chronic obstructive pulmonary disease with (acute) exacerbation: Secondary | ICD-10-CM | POA: Diagnosis not present

## 2015-10-06 DIAGNOSIS — F0280 Dementia in other diseases classified elsewhere without behavioral disturbance: Secondary | ICD-10-CM | POA: Diagnosis not present

## 2015-10-06 DIAGNOSIS — Z9981 Dependence on supplemental oxygen: Secondary | ICD-10-CM | POA: Diagnosis not present

## 2015-10-06 DIAGNOSIS — R1310 Dysphagia, unspecified: Secondary | ICD-10-CM | POA: Diagnosis not present

## 2015-10-06 DIAGNOSIS — G301 Alzheimer's disease with late onset: Secondary | ICD-10-CM | POA: Diagnosis not present

## 2015-10-06 DIAGNOSIS — E46 Unspecified protein-calorie malnutrition: Secondary | ICD-10-CM | POA: Diagnosis not present

## 2015-10-07 DIAGNOSIS — G301 Alzheimer's disease with late onset: Secondary | ICD-10-CM | POA: Diagnosis not present

## 2015-10-07 DIAGNOSIS — F0280 Dementia in other diseases classified elsewhere without behavioral disturbance: Secondary | ICD-10-CM | POA: Diagnosis not present

## 2015-10-07 DIAGNOSIS — E46 Unspecified protein-calorie malnutrition: Secondary | ICD-10-CM | POA: Diagnosis not present

## 2015-10-07 DIAGNOSIS — Z9981 Dependence on supplemental oxygen: Secondary | ICD-10-CM | POA: Diagnosis not present

## 2015-10-07 DIAGNOSIS — R1310 Dysphagia, unspecified: Secondary | ICD-10-CM | POA: Diagnosis not present

## 2015-10-07 DIAGNOSIS — J441 Chronic obstructive pulmonary disease with (acute) exacerbation: Secondary | ICD-10-CM | POA: Diagnosis not present

## 2015-10-08 DIAGNOSIS — R1310 Dysphagia, unspecified: Secondary | ICD-10-CM | POA: Diagnosis not present

## 2015-10-08 DIAGNOSIS — Z9981 Dependence on supplemental oxygen: Secondary | ICD-10-CM | POA: Diagnosis not present

## 2015-10-08 DIAGNOSIS — G301 Alzheimer's disease with late onset: Secondary | ICD-10-CM | POA: Diagnosis not present

## 2015-10-08 DIAGNOSIS — E46 Unspecified protein-calorie malnutrition: Secondary | ICD-10-CM | POA: Diagnosis not present

## 2015-10-08 DIAGNOSIS — F0280 Dementia in other diseases classified elsewhere without behavioral disturbance: Secondary | ICD-10-CM | POA: Diagnosis not present

## 2015-10-08 DIAGNOSIS — J441 Chronic obstructive pulmonary disease with (acute) exacerbation: Secondary | ICD-10-CM | POA: Diagnosis not present

## 2015-10-09 ENCOUNTER — Encounter (INDEPENDENT_AMBULATORY_CARE_PROVIDER_SITE_OTHER): Payer: Medicare Other | Admitting: Family Medicine

## 2015-10-09 DIAGNOSIS — E46 Unspecified protein-calorie malnutrition: Secondary | ICD-10-CM | POA: Diagnosis not present

## 2015-10-09 DIAGNOSIS — R1310 Dysphagia, unspecified: Secondary | ICD-10-CM | POA: Diagnosis not present

## 2015-10-09 DIAGNOSIS — J441 Chronic obstructive pulmonary disease with (acute) exacerbation: Secondary | ICD-10-CM | POA: Diagnosis not present

## 2015-10-09 DIAGNOSIS — Z9981 Dependence on supplemental oxygen: Secondary | ICD-10-CM | POA: Diagnosis not present

## 2015-10-09 DIAGNOSIS — F0280 Dementia in other diseases classified elsewhere without behavioral disturbance: Secondary | ICD-10-CM | POA: Diagnosis not present

## 2015-10-09 DIAGNOSIS — G301 Alzheimer's disease with late onset: Secondary | ICD-10-CM

## 2015-10-12 DIAGNOSIS — J441 Chronic obstructive pulmonary disease with (acute) exacerbation: Secondary | ICD-10-CM | POA: Diagnosis not present

## 2015-10-12 DIAGNOSIS — F0280 Dementia in other diseases classified elsewhere without behavioral disturbance: Secondary | ICD-10-CM | POA: Diagnosis not present

## 2015-10-12 DIAGNOSIS — R1310 Dysphagia, unspecified: Secondary | ICD-10-CM | POA: Diagnosis not present

## 2015-10-12 DIAGNOSIS — Z9981 Dependence on supplemental oxygen: Secondary | ICD-10-CM | POA: Diagnosis not present

## 2015-10-12 DIAGNOSIS — E46 Unspecified protein-calorie malnutrition: Secondary | ICD-10-CM | POA: Diagnosis not present

## 2015-10-12 DIAGNOSIS — G301 Alzheimer's disease with late onset: Secondary | ICD-10-CM | POA: Diagnosis not present

## 2015-10-13 DIAGNOSIS — F0280 Dementia in other diseases classified elsewhere without behavioral disturbance: Secondary | ICD-10-CM | POA: Diagnosis not present

## 2015-10-13 DIAGNOSIS — Z9981 Dependence on supplemental oxygen: Secondary | ICD-10-CM | POA: Diagnosis not present

## 2015-10-13 DIAGNOSIS — G301 Alzheimer's disease with late onset: Secondary | ICD-10-CM | POA: Diagnosis not present

## 2015-10-13 DIAGNOSIS — E46 Unspecified protein-calorie malnutrition: Secondary | ICD-10-CM | POA: Diagnosis not present

## 2015-10-13 DIAGNOSIS — J441 Chronic obstructive pulmonary disease with (acute) exacerbation: Secondary | ICD-10-CM | POA: Diagnosis not present

## 2015-10-13 DIAGNOSIS — R1310 Dysphagia, unspecified: Secondary | ICD-10-CM | POA: Diagnosis not present

## 2015-10-14 DIAGNOSIS — R1310 Dysphagia, unspecified: Secondary | ICD-10-CM | POA: Diagnosis not present

## 2015-10-14 DIAGNOSIS — F0280 Dementia in other diseases classified elsewhere without behavioral disturbance: Secondary | ICD-10-CM | POA: Diagnosis not present

## 2015-10-14 DIAGNOSIS — G301 Alzheimer's disease with late onset: Secondary | ICD-10-CM | POA: Diagnosis not present

## 2015-10-14 DIAGNOSIS — Z9981 Dependence on supplemental oxygen: Secondary | ICD-10-CM | POA: Diagnosis not present

## 2015-10-14 DIAGNOSIS — E46 Unspecified protein-calorie malnutrition: Secondary | ICD-10-CM | POA: Diagnosis not present

## 2015-10-14 DIAGNOSIS — J441 Chronic obstructive pulmonary disease with (acute) exacerbation: Secondary | ICD-10-CM | POA: Diagnosis not present

## 2015-10-15 DIAGNOSIS — Z9981 Dependence on supplemental oxygen: Secondary | ICD-10-CM | POA: Diagnosis not present

## 2015-10-15 DIAGNOSIS — E46 Unspecified protein-calorie malnutrition: Secondary | ICD-10-CM | POA: Diagnosis not present

## 2015-10-15 DIAGNOSIS — E785 Hyperlipidemia, unspecified: Secondary | ICD-10-CM | POA: Diagnosis not present

## 2015-10-15 DIAGNOSIS — F0280 Dementia in other diseases classified elsewhere without behavioral disturbance: Secondary | ICD-10-CM | POA: Diagnosis not present

## 2015-10-15 DIAGNOSIS — G301 Alzheimer's disease with late onset: Secondary | ICD-10-CM | POA: Diagnosis not present

## 2015-10-15 DIAGNOSIS — J441 Chronic obstructive pulmonary disease with (acute) exacerbation: Secondary | ICD-10-CM | POA: Diagnosis not present

## 2015-10-15 DIAGNOSIS — I714 Abdominal aortic aneurysm, without rupture: Secondary | ICD-10-CM | POA: Diagnosis not present

## 2015-10-15 DIAGNOSIS — R1310 Dysphagia, unspecified: Secondary | ICD-10-CM | POA: Diagnosis not present

## 2015-10-15 DIAGNOSIS — S32009D Unspecified fracture of unspecified lumbar vertebra, subsequent encounter for fracture with routine healing: Secondary | ICD-10-CM | POA: Diagnosis not present

## 2015-10-16 DIAGNOSIS — G301 Alzheimer's disease with late onset: Secondary | ICD-10-CM | POA: Diagnosis not present

## 2015-10-16 DIAGNOSIS — E46 Unspecified protein-calorie malnutrition: Secondary | ICD-10-CM | POA: Diagnosis not present

## 2015-10-16 DIAGNOSIS — J441 Chronic obstructive pulmonary disease with (acute) exacerbation: Secondary | ICD-10-CM | POA: Diagnosis not present

## 2015-10-16 DIAGNOSIS — Z9981 Dependence on supplemental oxygen: Secondary | ICD-10-CM | POA: Diagnosis not present

## 2015-10-16 DIAGNOSIS — R1310 Dysphagia, unspecified: Secondary | ICD-10-CM | POA: Diagnosis not present

## 2015-10-16 DIAGNOSIS — F0280 Dementia in other diseases classified elsewhere without behavioral disturbance: Secondary | ICD-10-CM | POA: Diagnosis not present

## 2015-10-19 ENCOUNTER — Other Ambulatory Visit: Payer: Self-pay | Admitting: *Deleted

## 2015-10-19 DIAGNOSIS — G301 Alzheimer's disease with late onset: Secondary | ICD-10-CM | POA: Diagnosis not present

## 2015-10-19 DIAGNOSIS — R1310 Dysphagia, unspecified: Secondary | ICD-10-CM | POA: Diagnosis not present

## 2015-10-19 DIAGNOSIS — J441 Chronic obstructive pulmonary disease with (acute) exacerbation: Secondary | ICD-10-CM | POA: Diagnosis not present

## 2015-10-19 DIAGNOSIS — Z9981 Dependence on supplemental oxygen: Secondary | ICD-10-CM | POA: Diagnosis not present

## 2015-10-19 DIAGNOSIS — F0280 Dementia in other diseases classified elsewhere without behavioral disturbance: Secondary | ICD-10-CM | POA: Diagnosis not present

## 2015-10-19 DIAGNOSIS — E46 Unspecified protein-calorie malnutrition: Secondary | ICD-10-CM | POA: Diagnosis not present

## 2015-10-19 MED ORDER — OXYCODONE HCL 5 MG PO TABS: 5.0000 mg | ORAL_TABLET | ORAL | Status: DC | PRN

## 2015-10-20 DIAGNOSIS — R1310 Dysphagia, unspecified: Secondary | ICD-10-CM | POA: Diagnosis not present

## 2015-10-20 DIAGNOSIS — G301 Alzheimer's disease with late onset: Secondary | ICD-10-CM | POA: Diagnosis not present

## 2015-10-20 DIAGNOSIS — Z9981 Dependence on supplemental oxygen: Secondary | ICD-10-CM | POA: Diagnosis not present

## 2015-10-20 DIAGNOSIS — J441 Chronic obstructive pulmonary disease with (acute) exacerbation: Secondary | ICD-10-CM | POA: Diagnosis not present

## 2015-10-20 DIAGNOSIS — E46 Unspecified protein-calorie malnutrition: Secondary | ICD-10-CM | POA: Diagnosis not present

## 2015-10-20 DIAGNOSIS — F0280 Dementia in other diseases classified elsewhere without behavioral disturbance: Secondary | ICD-10-CM | POA: Diagnosis not present

## 2015-10-21 DIAGNOSIS — J441 Chronic obstructive pulmonary disease with (acute) exacerbation: Secondary | ICD-10-CM | POA: Diagnosis not present

## 2015-10-21 DIAGNOSIS — Z9981 Dependence on supplemental oxygen: Secondary | ICD-10-CM | POA: Diagnosis not present

## 2015-10-21 DIAGNOSIS — E46 Unspecified protein-calorie malnutrition: Secondary | ICD-10-CM | POA: Diagnosis not present

## 2015-10-21 DIAGNOSIS — G301 Alzheimer's disease with late onset: Secondary | ICD-10-CM | POA: Diagnosis not present

## 2015-10-21 DIAGNOSIS — F0280 Dementia in other diseases classified elsewhere without behavioral disturbance: Secondary | ICD-10-CM | POA: Diagnosis not present

## 2015-10-21 DIAGNOSIS — R1310 Dysphagia, unspecified: Secondary | ICD-10-CM | POA: Diagnosis not present

## 2015-10-22 DIAGNOSIS — F0280 Dementia in other diseases classified elsewhere without behavioral disturbance: Secondary | ICD-10-CM | POA: Diagnosis not present

## 2015-10-22 DIAGNOSIS — E46 Unspecified protein-calorie malnutrition: Secondary | ICD-10-CM | POA: Diagnosis not present

## 2015-10-22 DIAGNOSIS — G301 Alzheimer's disease with late onset: Secondary | ICD-10-CM | POA: Diagnosis not present

## 2015-10-22 DIAGNOSIS — R1310 Dysphagia, unspecified: Secondary | ICD-10-CM | POA: Diagnosis not present

## 2015-10-22 DIAGNOSIS — J441 Chronic obstructive pulmonary disease with (acute) exacerbation: Secondary | ICD-10-CM | POA: Diagnosis not present

## 2015-10-22 DIAGNOSIS — Z9981 Dependence on supplemental oxygen: Secondary | ICD-10-CM | POA: Diagnosis not present

## 2015-10-23 ENCOUNTER — Telehealth: Payer: Self-pay | Admitting: Family Medicine

## 2015-10-23 DIAGNOSIS — Z9981 Dependence on supplemental oxygen: Secondary | ICD-10-CM | POA: Diagnosis not present

## 2015-10-23 DIAGNOSIS — J441 Chronic obstructive pulmonary disease with (acute) exacerbation: Secondary | ICD-10-CM | POA: Diagnosis not present

## 2015-10-23 DIAGNOSIS — R1310 Dysphagia, unspecified: Secondary | ICD-10-CM | POA: Diagnosis not present

## 2015-10-23 DIAGNOSIS — F0280 Dementia in other diseases classified elsewhere without behavioral disturbance: Secondary | ICD-10-CM | POA: Diagnosis not present

## 2015-10-23 DIAGNOSIS — G301 Alzheimer's disease with late onset: Secondary | ICD-10-CM | POA: Diagnosis not present

## 2015-10-23 DIAGNOSIS — E46 Unspecified protein-calorie malnutrition: Secondary | ICD-10-CM | POA: Diagnosis not present

## 2015-10-23 NOTE — Telephone Encounter (Signed)
Stacy with Hospice states she did try to complete a in and out cath.  She states it was too painful for the pt so she could not complete this.  She advised staff to get a clean catch.  [id number]

## 2015-10-26 DIAGNOSIS — F0280 Dementia in other diseases classified elsewhere without behavioral disturbance: Secondary | ICD-10-CM | POA: Diagnosis not present

## 2015-10-26 DIAGNOSIS — E46 Unspecified protein-calorie malnutrition: Secondary | ICD-10-CM | POA: Diagnosis not present

## 2015-10-26 DIAGNOSIS — G301 Alzheimer's disease with late onset: Secondary | ICD-10-CM | POA: Diagnosis not present

## 2015-10-26 DIAGNOSIS — Z9981 Dependence on supplemental oxygen: Secondary | ICD-10-CM | POA: Diagnosis not present

## 2015-10-26 DIAGNOSIS — R1310 Dysphagia, unspecified: Secondary | ICD-10-CM | POA: Diagnosis not present

## 2015-10-26 DIAGNOSIS — J441 Chronic obstructive pulmonary disease with (acute) exacerbation: Secondary | ICD-10-CM | POA: Diagnosis not present

## 2015-10-27 DIAGNOSIS — Z9981 Dependence on supplemental oxygen: Secondary | ICD-10-CM | POA: Diagnosis not present

## 2015-10-27 DIAGNOSIS — J441 Chronic obstructive pulmonary disease with (acute) exacerbation: Secondary | ICD-10-CM | POA: Diagnosis not present

## 2015-10-27 DIAGNOSIS — F0280 Dementia in other diseases classified elsewhere without behavioral disturbance: Secondary | ICD-10-CM | POA: Diagnosis not present

## 2015-10-27 DIAGNOSIS — E46 Unspecified protein-calorie malnutrition: Secondary | ICD-10-CM | POA: Diagnosis not present

## 2015-10-27 DIAGNOSIS — R1310 Dysphagia, unspecified: Secondary | ICD-10-CM | POA: Diagnosis not present

## 2015-10-27 DIAGNOSIS — G301 Alzheimer's disease with late onset: Secondary | ICD-10-CM | POA: Diagnosis not present

## 2015-10-28 DIAGNOSIS — Z9981 Dependence on supplemental oxygen: Secondary | ICD-10-CM | POA: Diagnosis not present

## 2015-10-28 DIAGNOSIS — E46 Unspecified protein-calorie malnutrition: Secondary | ICD-10-CM | POA: Diagnosis not present

## 2015-10-28 DIAGNOSIS — F0280 Dementia in other diseases classified elsewhere without behavioral disturbance: Secondary | ICD-10-CM | POA: Diagnosis not present

## 2015-10-28 DIAGNOSIS — J441 Chronic obstructive pulmonary disease with (acute) exacerbation: Secondary | ICD-10-CM | POA: Diagnosis not present

## 2015-10-28 DIAGNOSIS — R1310 Dysphagia, unspecified: Secondary | ICD-10-CM | POA: Diagnosis not present

## 2015-10-28 DIAGNOSIS — G301 Alzheimer's disease with late onset: Secondary | ICD-10-CM | POA: Diagnosis not present

## 2015-10-29 DIAGNOSIS — J441 Chronic obstructive pulmonary disease with (acute) exacerbation: Secondary | ICD-10-CM | POA: Diagnosis not present

## 2015-10-29 DIAGNOSIS — Z9981 Dependence on supplemental oxygen: Secondary | ICD-10-CM | POA: Diagnosis not present

## 2015-10-29 DIAGNOSIS — R1310 Dysphagia, unspecified: Secondary | ICD-10-CM | POA: Diagnosis not present

## 2015-10-29 DIAGNOSIS — E46 Unspecified protein-calorie malnutrition: Secondary | ICD-10-CM | POA: Diagnosis not present

## 2015-10-29 DIAGNOSIS — G301 Alzheimer's disease with late onset: Secondary | ICD-10-CM | POA: Diagnosis not present

## 2015-10-29 DIAGNOSIS — F0280 Dementia in other diseases classified elsewhere without behavioral disturbance: Secondary | ICD-10-CM | POA: Diagnosis not present

## 2015-10-30 DIAGNOSIS — E46 Unspecified protein-calorie malnutrition: Secondary | ICD-10-CM | POA: Diagnosis not present

## 2015-10-30 DIAGNOSIS — F0280 Dementia in other diseases classified elsewhere without behavioral disturbance: Secondary | ICD-10-CM | POA: Diagnosis not present

## 2015-10-30 DIAGNOSIS — Z9981 Dependence on supplemental oxygen: Secondary | ICD-10-CM | POA: Diagnosis not present

## 2015-10-30 DIAGNOSIS — G301 Alzheimer's disease with late onset: Secondary | ICD-10-CM | POA: Diagnosis not present

## 2015-10-30 DIAGNOSIS — R1310 Dysphagia, unspecified: Secondary | ICD-10-CM | POA: Diagnosis not present

## 2015-10-30 DIAGNOSIS — J441 Chronic obstructive pulmonary disease with (acute) exacerbation: Secondary | ICD-10-CM | POA: Diagnosis not present

## 2015-11-02 DIAGNOSIS — R1310 Dysphagia, unspecified: Secondary | ICD-10-CM | POA: Diagnosis not present

## 2015-11-02 DIAGNOSIS — Z9981 Dependence on supplemental oxygen: Secondary | ICD-10-CM | POA: Diagnosis not present

## 2015-11-02 DIAGNOSIS — G301 Alzheimer's disease with late onset: Secondary | ICD-10-CM | POA: Diagnosis not present

## 2015-11-02 DIAGNOSIS — E46 Unspecified protein-calorie malnutrition: Secondary | ICD-10-CM | POA: Diagnosis not present

## 2015-11-02 DIAGNOSIS — F0280 Dementia in other diseases classified elsewhere without behavioral disturbance: Secondary | ICD-10-CM | POA: Diagnosis not present

## 2015-11-02 DIAGNOSIS — J441 Chronic obstructive pulmonary disease with (acute) exacerbation: Secondary | ICD-10-CM | POA: Diagnosis not present

## 2015-11-03 DIAGNOSIS — F0280 Dementia in other diseases classified elsewhere without behavioral disturbance: Secondary | ICD-10-CM | POA: Diagnosis not present

## 2015-11-03 DIAGNOSIS — G301 Alzheimer's disease with late onset: Secondary | ICD-10-CM | POA: Diagnosis not present

## 2015-11-03 DIAGNOSIS — J441 Chronic obstructive pulmonary disease with (acute) exacerbation: Secondary | ICD-10-CM | POA: Diagnosis not present

## 2015-11-03 DIAGNOSIS — E46 Unspecified protein-calorie malnutrition: Secondary | ICD-10-CM | POA: Diagnosis not present

## 2015-11-03 DIAGNOSIS — Z9981 Dependence on supplemental oxygen: Secondary | ICD-10-CM | POA: Diagnosis not present

## 2015-11-03 DIAGNOSIS — R1310 Dysphagia, unspecified: Secondary | ICD-10-CM | POA: Diagnosis not present

## 2015-11-04 DIAGNOSIS — R1310 Dysphagia, unspecified: Secondary | ICD-10-CM | POA: Diagnosis not present

## 2015-11-04 DIAGNOSIS — G301 Alzheimer's disease with late onset: Secondary | ICD-10-CM | POA: Diagnosis not present

## 2015-11-04 DIAGNOSIS — J441 Chronic obstructive pulmonary disease with (acute) exacerbation: Secondary | ICD-10-CM | POA: Diagnosis not present

## 2015-11-04 DIAGNOSIS — Z9981 Dependence on supplemental oxygen: Secondary | ICD-10-CM | POA: Diagnosis not present

## 2015-11-04 DIAGNOSIS — F0280 Dementia in other diseases classified elsewhere without behavioral disturbance: Secondary | ICD-10-CM | POA: Diagnosis not present

## 2015-11-04 DIAGNOSIS — E46 Unspecified protein-calorie malnutrition: Secondary | ICD-10-CM | POA: Diagnosis not present

## 2015-11-05 DIAGNOSIS — J441 Chronic obstructive pulmonary disease with (acute) exacerbation: Secondary | ICD-10-CM | POA: Diagnosis not present

## 2015-11-05 DIAGNOSIS — G301 Alzheimer's disease with late onset: Secondary | ICD-10-CM | POA: Diagnosis not present

## 2015-11-05 DIAGNOSIS — R1310 Dysphagia, unspecified: Secondary | ICD-10-CM | POA: Diagnosis not present

## 2015-11-05 DIAGNOSIS — F0280 Dementia in other diseases classified elsewhere without behavioral disturbance: Secondary | ICD-10-CM | POA: Diagnosis not present

## 2015-11-05 DIAGNOSIS — E46 Unspecified protein-calorie malnutrition: Secondary | ICD-10-CM | POA: Diagnosis not present

## 2015-11-05 DIAGNOSIS — Z9981 Dependence on supplemental oxygen: Secondary | ICD-10-CM | POA: Diagnosis not present

## 2015-11-06 DIAGNOSIS — G301 Alzheimer's disease with late onset: Secondary | ICD-10-CM | POA: Diagnosis not present

## 2015-11-06 DIAGNOSIS — F0280 Dementia in other diseases classified elsewhere without behavioral disturbance: Secondary | ICD-10-CM | POA: Diagnosis not present

## 2015-11-06 DIAGNOSIS — E46 Unspecified protein-calorie malnutrition: Secondary | ICD-10-CM | POA: Diagnosis not present

## 2015-11-06 DIAGNOSIS — Z9981 Dependence on supplemental oxygen: Secondary | ICD-10-CM | POA: Diagnosis not present

## 2015-11-06 DIAGNOSIS — J441 Chronic obstructive pulmonary disease with (acute) exacerbation: Secondary | ICD-10-CM | POA: Diagnosis not present

## 2015-11-06 DIAGNOSIS — R1310 Dysphagia, unspecified: Secondary | ICD-10-CM | POA: Diagnosis not present

## 2015-11-09 DIAGNOSIS — F0280 Dementia in other diseases classified elsewhere without behavioral disturbance: Secondary | ICD-10-CM | POA: Diagnosis not present

## 2015-11-09 DIAGNOSIS — J441 Chronic obstructive pulmonary disease with (acute) exacerbation: Secondary | ICD-10-CM | POA: Diagnosis not present

## 2015-11-09 DIAGNOSIS — E46 Unspecified protein-calorie malnutrition: Secondary | ICD-10-CM | POA: Diagnosis not present

## 2015-11-09 DIAGNOSIS — Z9981 Dependence on supplemental oxygen: Secondary | ICD-10-CM | POA: Diagnosis not present

## 2015-11-09 DIAGNOSIS — G301 Alzheimer's disease with late onset: Secondary | ICD-10-CM | POA: Diagnosis not present

## 2015-11-09 DIAGNOSIS — R1310 Dysphagia, unspecified: Secondary | ICD-10-CM | POA: Diagnosis not present

## 2015-11-10 ENCOUNTER — Telehealth: Payer: Self-pay | Admitting: Family Medicine

## 2015-11-10 ENCOUNTER — Other Ambulatory Visit: Payer: Self-pay

## 2015-11-10 DIAGNOSIS — G301 Alzheimer's disease with late onset: Secondary | ICD-10-CM | POA: Diagnosis not present

## 2015-11-10 DIAGNOSIS — R1310 Dysphagia, unspecified: Secondary | ICD-10-CM | POA: Diagnosis not present

## 2015-11-10 DIAGNOSIS — J441 Chronic obstructive pulmonary disease with (acute) exacerbation: Secondary | ICD-10-CM | POA: Diagnosis not present

## 2015-11-10 DIAGNOSIS — E46 Unspecified protein-calorie malnutrition: Secondary | ICD-10-CM | POA: Diagnosis not present

## 2015-11-10 DIAGNOSIS — Z9981 Dependence on supplemental oxygen: Secondary | ICD-10-CM | POA: Diagnosis not present

## 2015-11-10 DIAGNOSIS — F0280 Dementia in other diseases classified elsewhere without behavioral disturbance: Secondary | ICD-10-CM | POA: Diagnosis not present

## 2015-11-10 MED ORDER — AZITHROMYCIN 250 MG PO TABS: ORAL_TABLET | ORAL | Status: DC

## 2015-11-10 NOTE — Telephone Encounter (Signed)
Received faxed today stating the yesterday patient had increased congestion and a wet productive cough. Please advise rx request?

## 2015-11-10 NOTE — Telephone Encounter (Signed)
Faxed over

## 2015-11-10 NOTE — Telephone Encounter (Signed)
Stacey with Hospice states the patient is running a fever of 100.6 and is requesting and antibiotic.  She states a fax was sent over yesterday and the only change was now the patient has a fever.

## 2015-11-10 NOTE — Telephone Encounter (Signed)
Z pak

## 2015-11-11 DIAGNOSIS — Z9981 Dependence on supplemental oxygen: Secondary | ICD-10-CM | POA: Diagnosis not present

## 2015-11-11 DIAGNOSIS — J441 Chronic obstructive pulmonary disease with (acute) exacerbation: Secondary | ICD-10-CM | POA: Diagnosis not present

## 2015-11-11 DIAGNOSIS — R1310 Dysphagia, unspecified: Secondary | ICD-10-CM | POA: Diagnosis not present

## 2015-11-11 DIAGNOSIS — F0280 Dementia in other diseases classified elsewhere without behavioral disturbance: Secondary | ICD-10-CM | POA: Diagnosis not present

## 2015-11-11 DIAGNOSIS — G301 Alzheimer's disease with late onset: Secondary | ICD-10-CM | POA: Diagnosis not present

## 2015-11-11 DIAGNOSIS — E46 Unspecified protein-calorie malnutrition: Secondary | ICD-10-CM | POA: Diagnosis not present

## 2015-11-12 DIAGNOSIS — J441 Chronic obstructive pulmonary disease with (acute) exacerbation: Secondary | ICD-10-CM | POA: Diagnosis not present

## 2015-11-12 DIAGNOSIS — F0280 Dementia in other diseases classified elsewhere without behavioral disturbance: Secondary | ICD-10-CM | POA: Diagnosis not present

## 2015-11-12 DIAGNOSIS — R1310 Dysphagia, unspecified: Secondary | ICD-10-CM | POA: Diagnosis not present

## 2015-11-12 DIAGNOSIS — E46 Unspecified protein-calorie malnutrition: Secondary | ICD-10-CM | POA: Diagnosis not present

## 2015-11-12 DIAGNOSIS — Z9981 Dependence on supplemental oxygen: Secondary | ICD-10-CM | POA: Diagnosis not present

## 2015-11-12 DIAGNOSIS — G301 Alzheimer's disease with late onset: Secondary | ICD-10-CM | POA: Diagnosis not present

## 2015-11-13 DIAGNOSIS — Z9981 Dependence on supplemental oxygen: Secondary | ICD-10-CM | POA: Diagnosis not present

## 2015-11-13 DIAGNOSIS — G301 Alzheimer's disease with late onset: Secondary | ICD-10-CM | POA: Diagnosis not present

## 2015-11-13 DIAGNOSIS — J441 Chronic obstructive pulmonary disease with (acute) exacerbation: Secondary | ICD-10-CM | POA: Diagnosis not present

## 2015-11-13 DIAGNOSIS — R1310 Dysphagia, unspecified: Secondary | ICD-10-CM | POA: Diagnosis not present

## 2015-11-13 DIAGNOSIS — E46 Unspecified protein-calorie malnutrition: Secondary | ICD-10-CM | POA: Diagnosis not present

## 2015-11-13 DIAGNOSIS — F0280 Dementia in other diseases classified elsewhere without behavioral disturbance: Secondary | ICD-10-CM | POA: Diagnosis not present

## 2015-11-14 DIAGNOSIS — I714 Abdominal aortic aneurysm, without rupture: Secondary | ICD-10-CM | POA: Diagnosis not present

## 2015-11-14 DIAGNOSIS — F0280 Dementia in other diseases classified elsewhere without behavioral disturbance: Secondary | ICD-10-CM | POA: Diagnosis not present

## 2015-11-14 DIAGNOSIS — G301 Alzheimer's disease with late onset: Secondary | ICD-10-CM | POA: Diagnosis not present

## 2015-11-14 DIAGNOSIS — J441 Chronic obstructive pulmonary disease with (acute) exacerbation: Secondary | ICD-10-CM | POA: Diagnosis not present

## 2015-11-14 DIAGNOSIS — R1310 Dysphagia, unspecified: Secondary | ICD-10-CM | POA: Diagnosis not present

## 2015-11-14 DIAGNOSIS — S32009D Unspecified fracture of unspecified lumbar vertebra, subsequent encounter for fracture with routine healing: Secondary | ICD-10-CM | POA: Diagnosis not present

## 2015-11-14 DIAGNOSIS — Z9981 Dependence on supplemental oxygen: Secondary | ICD-10-CM | POA: Diagnosis not present

## 2015-11-14 DIAGNOSIS — E785 Hyperlipidemia, unspecified: Secondary | ICD-10-CM | POA: Diagnosis not present

## 2015-11-14 DIAGNOSIS — E46 Unspecified protein-calorie malnutrition: Secondary | ICD-10-CM | POA: Diagnosis not present

## 2015-11-15 DIAGNOSIS — E46 Unspecified protein-calorie malnutrition: Secondary | ICD-10-CM | POA: Diagnosis not present

## 2015-11-15 DIAGNOSIS — R1310 Dysphagia, unspecified: Secondary | ICD-10-CM | POA: Diagnosis not present

## 2015-11-15 DIAGNOSIS — J441 Chronic obstructive pulmonary disease with (acute) exacerbation: Secondary | ICD-10-CM | POA: Diagnosis not present

## 2015-11-15 DIAGNOSIS — F0280 Dementia in other diseases classified elsewhere without behavioral disturbance: Secondary | ICD-10-CM | POA: Diagnosis not present

## 2015-11-15 DIAGNOSIS — Z9981 Dependence on supplemental oxygen: Secondary | ICD-10-CM | POA: Diagnosis not present

## 2015-11-15 DIAGNOSIS — G301 Alzheimer's disease with late onset: Secondary | ICD-10-CM | POA: Diagnosis not present

## 2015-11-16 DIAGNOSIS — R1310 Dysphagia, unspecified: Secondary | ICD-10-CM | POA: Diagnosis not present

## 2015-11-16 DIAGNOSIS — G301 Alzheimer's disease with late onset: Secondary | ICD-10-CM | POA: Diagnosis not present

## 2015-11-16 DIAGNOSIS — F0280 Dementia in other diseases classified elsewhere without behavioral disturbance: Secondary | ICD-10-CM | POA: Diagnosis not present

## 2015-11-16 DIAGNOSIS — J441 Chronic obstructive pulmonary disease with (acute) exacerbation: Secondary | ICD-10-CM | POA: Diagnosis not present

## 2015-11-16 DIAGNOSIS — Z9981 Dependence on supplemental oxygen: Secondary | ICD-10-CM | POA: Diagnosis not present

## 2015-11-16 DIAGNOSIS — E46 Unspecified protein-calorie malnutrition: Secondary | ICD-10-CM | POA: Diagnosis not present

## 2015-11-17 DIAGNOSIS — J441 Chronic obstructive pulmonary disease with (acute) exacerbation: Secondary | ICD-10-CM | POA: Diagnosis not present

## 2015-11-17 DIAGNOSIS — F0280 Dementia in other diseases classified elsewhere without behavioral disturbance: Secondary | ICD-10-CM | POA: Diagnosis not present

## 2015-11-17 DIAGNOSIS — Z9981 Dependence on supplemental oxygen: Secondary | ICD-10-CM | POA: Diagnosis not present

## 2015-11-17 DIAGNOSIS — G301 Alzheimer's disease with late onset: Secondary | ICD-10-CM | POA: Diagnosis not present

## 2015-11-17 DIAGNOSIS — E46 Unspecified protein-calorie malnutrition: Secondary | ICD-10-CM | POA: Diagnosis not present

## 2015-11-17 DIAGNOSIS — R1310 Dysphagia, unspecified: Secondary | ICD-10-CM | POA: Diagnosis not present

## 2015-11-18 ENCOUNTER — Other Ambulatory Visit: Payer: Self-pay | Admitting: *Deleted

## 2015-11-18 DIAGNOSIS — G301 Alzheimer's disease with late onset: Secondary | ICD-10-CM | POA: Diagnosis not present

## 2015-11-18 DIAGNOSIS — F0280 Dementia in other diseases classified elsewhere without behavioral disturbance: Secondary | ICD-10-CM | POA: Diagnosis not present

## 2015-11-18 DIAGNOSIS — Z9981 Dependence on supplemental oxygen: Secondary | ICD-10-CM | POA: Diagnosis not present

## 2015-11-18 DIAGNOSIS — E46 Unspecified protein-calorie malnutrition: Secondary | ICD-10-CM | POA: Diagnosis not present

## 2015-11-18 DIAGNOSIS — R1310 Dysphagia, unspecified: Secondary | ICD-10-CM | POA: Diagnosis not present

## 2015-11-18 DIAGNOSIS — J441 Chronic obstructive pulmonary disease with (acute) exacerbation: Secondary | ICD-10-CM | POA: Diagnosis not present

## 2015-11-18 MED ORDER — OXYCODONE HCL 5 MG PO TABS: 5.0000 mg | ORAL_TABLET | ORAL | Status: DC | PRN

## 2015-11-19 DIAGNOSIS — E46 Unspecified protein-calorie malnutrition: Secondary | ICD-10-CM | POA: Diagnosis not present

## 2015-11-19 DIAGNOSIS — R1310 Dysphagia, unspecified: Secondary | ICD-10-CM | POA: Diagnosis not present

## 2015-11-19 DIAGNOSIS — J441 Chronic obstructive pulmonary disease with (acute) exacerbation: Secondary | ICD-10-CM | POA: Diagnosis not present

## 2015-11-19 DIAGNOSIS — G301 Alzheimer's disease with late onset: Secondary | ICD-10-CM | POA: Diagnosis not present

## 2015-11-19 DIAGNOSIS — Z9981 Dependence on supplemental oxygen: Secondary | ICD-10-CM | POA: Diagnosis not present

## 2015-11-19 DIAGNOSIS — F0280 Dementia in other diseases classified elsewhere without behavioral disturbance: Secondary | ICD-10-CM | POA: Diagnosis not present

## 2015-11-20 DIAGNOSIS — E46 Unspecified protein-calorie malnutrition: Secondary | ICD-10-CM | POA: Diagnosis not present

## 2015-11-20 DIAGNOSIS — J441 Chronic obstructive pulmonary disease with (acute) exacerbation: Secondary | ICD-10-CM | POA: Diagnosis not present

## 2015-11-20 DIAGNOSIS — G301 Alzheimer's disease with late onset: Secondary | ICD-10-CM | POA: Diagnosis not present

## 2015-11-20 DIAGNOSIS — R1310 Dysphagia, unspecified: Secondary | ICD-10-CM | POA: Diagnosis not present

## 2015-11-20 DIAGNOSIS — F0280 Dementia in other diseases classified elsewhere without behavioral disturbance: Secondary | ICD-10-CM | POA: Diagnosis not present

## 2015-11-20 DIAGNOSIS — Z9981 Dependence on supplemental oxygen: Secondary | ICD-10-CM | POA: Diagnosis not present

## 2015-11-23 DIAGNOSIS — G301 Alzheimer's disease with late onset: Secondary | ICD-10-CM | POA: Diagnosis not present

## 2015-11-23 DIAGNOSIS — R1310 Dysphagia, unspecified: Secondary | ICD-10-CM | POA: Diagnosis not present

## 2015-11-23 DIAGNOSIS — Z9981 Dependence on supplemental oxygen: Secondary | ICD-10-CM | POA: Diagnosis not present

## 2015-11-23 DIAGNOSIS — E46 Unspecified protein-calorie malnutrition: Secondary | ICD-10-CM | POA: Diagnosis not present

## 2015-11-23 DIAGNOSIS — J441 Chronic obstructive pulmonary disease with (acute) exacerbation: Secondary | ICD-10-CM | POA: Diagnosis not present

## 2015-11-23 DIAGNOSIS — F0280 Dementia in other diseases classified elsewhere without behavioral disturbance: Secondary | ICD-10-CM | POA: Diagnosis not present

## 2015-11-24 DIAGNOSIS — F0280 Dementia in other diseases classified elsewhere without behavioral disturbance: Secondary | ICD-10-CM | POA: Diagnosis not present

## 2015-11-24 DIAGNOSIS — Z9981 Dependence on supplemental oxygen: Secondary | ICD-10-CM | POA: Diagnosis not present

## 2015-11-24 DIAGNOSIS — J441 Chronic obstructive pulmonary disease with (acute) exacerbation: Secondary | ICD-10-CM | POA: Diagnosis not present

## 2015-11-24 DIAGNOSIS — R1310 Dysphagia, unspecified: Secondary | ICD-10-CM | POA: Diagnosis not present

## 2015-11-24 DIAGNOSIS — E46 Unspecified protein-calorie malnutrition: Secondary | ICD-10-CM | POA: Diagnosis not present

## 2015-11-24 DIAGNOSIS — G301 Alzheimer's disease with late onset: Secondary | ICD-10-CM | POA: Diagnosis not present

## 2015-11-25 DIAGNOSIS — J441 Chronic obstructive pulmonary disease with (acute) exacerbation: Secondary | ICD-10-CM | POA: Diagnosis not present

## 2015-11-25 DIAGNOSIS — Z9981 Dependence on supplemental oxygen: Secondary | ICD-10-CM | POA: Diagnosis not present

## 2015-11-25 DIAGNOSIS — F0280 Dementia in other diseases classified elsewhere without behavioral disturbance: Secondary | ICD-10-CM | POA: Diagnosis not present

## 2015-11-25 DIAGNOSIS — E46 Unspecified protein-calorie malnutrition: Secondary | ICD-10-CM | POA: Diagnosis not present

## 2015-11-25 DIAGNOSIS — G301 Alzheimer's disease with late onset: Secondary | ICD-10-CM | POA: Diagnosis not present

## 2015-11-25 DIAGNOSIS — R1310 Dysphagia, unspecified: Secondary | ICD-10-CM | POA: Diagnosis not present

## 2015-11-26 DIAGNOSIS — F0280 Dementia in other diseases classified elsewhere without behavioral disturbance: Secondary | ICD-10-CM | POA: Diagnosis not present

## 2015-11-26 DIAGNOSIS — G301 Alzheimer's disease with late onset: Secondary | ICD-10-CM | POA: Diagnosis not present

## 2015-11-26 DIAGNOSIS — Z9981 Dependence on supplemental oxygen: Secondary | ICD-10-CM | POA: Diagnosis not present

## 2015-11-26 DIAGNOSIS — R1310 Dysphagia, unspecified: Secondary | ICD-10-CM | POA: Diagnosis not present

## 2015-11-26 DIAGNOSIS — J441 Chronic obstructive pulmonary disease with (acute) exacerbation: Secondary | ICD-10-CM | POA: Diagnosis not present

## 2015-11-26 DIAGNOSIS — E46 Unspecified protein-calorie malnutrition: Secondary | ICD-10-CM | POA: Diagnosis not present

## 2015-11-27 DIAGNOSIS — R1310 Dysphagia, unspecified: Secondary | ICD-10-CM | POA: Diagnosis not present

## 2015-11-27 DIAGNOSIS — G301 Alzheimer's disease with late onset: Secondary | ICD-10-CM | POA: Diagnosis not present

## 2015-11-27 DIAGNOSIS — J441 Chronic obstructive pulmonary disease with (acute) exacerbation: Secondary | ICD-10-CM | POA: Diagnosis not present

## 2015-11-27 DIAGNOSIS — E46 Unspecified protein-calorie malnutrition: Secondary | ICD-10-CM | POA: Diagnosis not present

## 2015-11-27 DIAGNOSIS — Z9981 Dependence on supplemental oxygen: Secondary | ICD-10-CM | POA: Diagnosis not present

## 2015-11-27 DIAGNOSIS — F0280 Dementia in other diseases classified elsewhere without behavioral disturbance: Secondary | ICD-10-CM | POA: Diagnosis not present

## 2015-11-30 DIAGNOSIS — J441 Chronic obstructive pulmonary disease with (acute) exacerbation: Secondary | ICD-10-CM | POA: Diagnosis not present

## 2015-11-30 DIAGNOSIS — R1310 Dysphagia, unspecified: Secondary | ICD-10-CM | POA: Diagnosis not present

## 2015-11-30 DIAGNOSIS — F0280 Dementia in other diseases classified elsewhere without behavioral disturbance: Secondary | ICD-10-CM | POA: Diagnosis not present

## 2015-11-30 DIAGNOSIS — G301 Alzheimer's disease with late onset: Secondary | ICD-10-CM | POA: Diagnosis not present

## 2015-11-30 DIAGNOSIS — Z9981 Dependence on supplemental oxygen: Secondary | ICD-10-CM | POA: Diagnosis not present

## 2015-11-30 DIAGNOSIS — E46 Unspecified protein-calorie malnutrition: Secondary | ICD-10-CM | POA: Diagnosis not present

## 2015-12-01 DIAGNOSIS — Z9981 Dependence on supplemental oxygen: Secondary | ICD-10-CM | POA: Diagnosis not present

## 2015-12-01 DIAGNOSIS — R1310 Dysphagia, unspecified: Secondary | ICD-10-CM | POA: Diagnosis not present

## 2015-12-01 DIAGNOSIS — E46 Unspecified protein-calorie malnutrition: Secondary | ICD-10-CM | POA: Diagnosis not present

## 2015-12-01 DIAGNOSIS — G301 Alzheimer's disease with late onset: Secondary | ICD-10-CM | POA: Diagnosis not present

## 2015-12-01 DIAGNOSIS — F0280 Dementia in other diseases classified elsewhere without behavioral disturbance: Secondary | ICD-10-CM | POA: Diagnosis not present

## 2015-12-01 DIAGNOSIS — J441 Chronic obstructive pulmonary disease with (acute) exacerbation: Secondary | ICD-10-CM | POA: Diagnosis not present

## 2015-12-02 DIAGNOSIS — E46 Unspecified protein-calorie malnutrition: Secondary | ICD-10-CM | POA: Diagnosis not present

## 2015-12-02 DIAGNOSIS — G301 Alzheimer's disease with late onset: Secondary | ICD-10-CM | POA: Diagnosis not present

## 2015-12-02 DIAGNOSIS — J441 Chronic obstructive pulmonary disease with (acute) exacerbation: Secondary | ICD-10-CM | POA: Diagnosis not present

## 2015-12-02 DIAGNOSIS — R1310 Dysphagia, unspecified: Secondary | ICD-10-CM | POA: Diagnosis not present

## 2015-12-02 DIAGNOSIS — F0280 Dementia in other diseases classified elsewhere without behavioral disturbance: Secondary | ICD-10-CM | POA: Diagnosis not present

## 2015-12-02 DIAGNOSIS — Z9981 Dependence on supplemental oxygen: Secondary | ICD-10-CM | POA: Diagnosis not present

## 2015-12-03 DIAGNOSIS — F0280 Dementia in other diseases classified elsewhere without behavioral disturbance: Secondary | ICD-10-CM | POA: Diagnosis not present

## 2015-12-03 DIAGNOSIS — R1310 Dysphagia, unspecified: Secondary | ICD-10-CM | POA: Diagnosis not present

## 2015-12-03 DIAGNOSIS — G301 Alzheimer's disease with late onset: Secondary | ICD-10-CM | POA: Diagnosis not present

## 2015-12-03 DIAGNOSIS — E46 Unspecified protein-calorie malnutrition: Secondary | ICD-10-CM | POA: Diagnosis not present

## 2015-12-03 DIAGNOSIS — J441 Chronic obstructive pulmonary disease with (acute) exacerbation: Secondary | ICD-10-CM | POA: Diagnosis not present

## 2015-12-03 DIAGNOSIS — Z9981 Dependence on supplemental oxygen: Secondary | ICD-10-CM | POA: Diagnosis not present

## 2015-12-04 DIAGNOSIS — Z9981 Dependence on supplemental oxygen: Secondary | ICD-10-CM | POA: Diagnosis not present

## 2015-12-04 DIAGNOSIS — R1310 Dysphagia, unspecified: Secondary | ICD-10-CM | POA: Diagnosis not present

## 2015-12-04 DIAGNOSIS — G301 Alzheimer's disease with late onset: Secondary | ICD-10-CM | POA: Diagnosis not present

## 2015-12-04 DIAGNOSIS — J441 Chronic obstructive pulmonary disease with (acute) exacerbation: Secondary | ICD-10-CM | POA: Diagnosis not present

## 2015-12-04 DIAGNOSIS — E46 Unspecified protein-calorie malnutrition: Secondary | ICD-10-CM | POA: Diagnosis not present

## 2015-12-04 DIAGNOSIS — F0280 Dementia in other diseases classified elsewhere without behavioral disturbance: Secondary | ICD-10-CM | POA: Diagnosis not present

## 2015-12-07 DIAGNOSIS — Z9981 Dependence on supplemental oxygen: Secondary | ICD-10-CM | POA: Diagnosis not present

## 2015-12-07 DIAGNOSIS — E46 Unspecified protein-calorie malnutrition: Secondary | ICD-10-CM | POA: Diagnosis not present

## 2015-12-07 DIAGNOSIS — R1310 Dysphagia, unspecified: Secondary | ICD-10-CM | POA: Diagnosis not present

## 2015-12-07 DIAGNOSIS — G301 Alzheimer's disease with late onset: Secondary | ICD-10-CM | POA: Diagnosis not present

## 2015-12-07 DIAGNOSIS — J441 Chronic obstructive pulmonary disease with (acute) exacerbation: Secondary | ICD-10-CM | POA: Diagnosis not present

## 2015-12-07 DIAGNOSIS — F0280 Dementia in other diseases classified elsewhere without behavioral disturbance: Secondary | ICD-10-CM | POA: Diagnosis not present

## 2015-12-08 DIAGNOSIS — J441 Chronic obstructive pulmonary disease with (acute) exacerbation: Secondary | ICD-10-CM | POA: Diagnosis not present

## 2015-12-08 DIAGNOSIS — Z9981 Dependence on supplemental oxygen: Secondary | ICD-10-CM | POA: Diagnosis not present

## 2015-12-08 DIAGNOSIS — G301 Alzheimer's disease with late onset: Secondary | ICD-10-CM | POA: Diagnosis not present

## 2015-12-08 DIAGNOSIS — R1310 Dysphagia, unspecified: Secondary | ICD-10-CM | POA: Diagnosis not present

## 2015-12-08 DIAGNOSIS — E46 Unspecified protein-calorie malnutrition: Secondary | ICD-10-CM | POA: Diagnosis not present

## 2015-12-08 DIAGNOSIS — F0280 Dementia in other diseases classified elsewhere without behavioral disturbance: Secondary | ICD-10-CM | POA: Diagnosis not present

## 2015-12-09 DIAGNOSIS — G301 Alzheimer's disease with late onset: Secondary | ICD-10-CM | POA: Diagnosis not present

## 2015-12-09 DIAGNOSIS — J441 Chronic obstructive pulmonary disease with (acute) exacerbation: Secondary | ICD-10-CM | POA: Diagnosis not present

## 2015-12-09 DIAGNOSIS — Z9981 Dependence on supplemental oxygen: Secondary | ICD-10-CM | POA: Diagnosis not present

## 2015-12-09 DIAGNOSIS — F0280 Dementia in other diseases classified elsewhere without behavioral disturbance: Secondary | ICD-10-CM | POA: Diagnosis not present

## 2015-12-09 DIAGNOSIS — R1310 Dysphagia, unspecified: Secondary | ICD-10-CM | POA: Diagnosis not present

## 2015-12-09 DIAGNOSIS — E46 Unspecified protein-calorie malnutrition: Secondary | ICD-10-CM | POA: Diagnosis not present

## 2015-12-10 DIAGNOSIS — F0280 Dementia in other diseases classified elsewhere without behavioral disturbance: Secondary | ICD-10-CM | POA: Diagnosis not present

## 2015-12-10 DIAGNOSIS — R1310 Dysphagia, unspecified: Secondary | ICD-10-CM | POA: Diagnosis not present

## 2015-12-10 DIAGNOSIS — G301 Alzheimer's disease with late onset: Secondary | ICD-10-CM | POA: Diagnosis not present

## 2015-12-10 DIAGNOSIS — J441 Chronic obstructive pulmonary disease with (acute) exacerbation: Secondary | ICD-10-CM | POA: Diagnosis not present

## 2015-12-10 DIAGNOSIS — E46 Unspecified protein-calorie malnutrition: Secondary | ICD-10-CM | POA: Diagnosis not present

## 2015-12-10 DIAGNOSIS — Z9981 Dependence on supplemental oxygen: Secondary | ICD-10-CM | POA: Diagnosis not present

## 2015-12-11 DIAGNOSIS — F0280 Dementia in other diseases classified elsewhere without behavioral disturbance: Secondary | ICD-10-CM | POA: Diagnosis not present

## 2015-12-11 DIAGNOSIS — J441 Chronic obstructive pulmonary disease with (acute) exacerbation: Secondary | ICD-10-CM | POA: Diagnosis not present

## 2015-12-11 DIAGNOSIS — G301 Alzheimer's disease with late onset: Secondary | ICD-10-CM | POA: Diagnosis not present

## 2015-12-11 DIAGNOSIS — R1310 Dysphagia, unspecified: Secondary | ICD-10-CM | POA: Diagnosis not present

## 2015-12-11 DIAGNOSIS — Z9981 Dependence on supplemental oxygen: Secondary | ICD-10-CM | POA: Diagnosis not present

## 2015-12-11 DIAGNOSIS — E46 Unspecified protein-calorie malnutrition: Secondary | ICD-10-CM | POA: Diagnosis not present

## 2015-12-14 DIAGNOSIS — R1310 Dysphagia, unspecified: Secondary | ICD-10-CM | POA: Diagnosis not present

## 2015-12-14 DIAGNOSIS — G301 Alzheimer's disease with late onset: Secondary | ICD-10-CM | POA: Diagnosis not present

## 2015-12-14 DIAGNOSIS — E46 Unspecified protein-calorie malnutrition: Secondary | ICD-10-CM | POA: Diagnosis not present

## 2015-12-14 DIAGNOSIS — Z9981 Dependence on supplemental oxygen: Secondary | ICD-10-CM | POA: Diagnosis not present

## 2015-12-14 DIAGNOSIS — F0280 Dementia in other diseases classified elsewhere without behavioral disturbance: Secondary | ICD-10-CM | POA: Diagnosis not present

## 2015-12-14 DIAGNOSIS — J441 Chronic obstructive pulmonary disease with (acute) exacerbation: Secondary | ICD-10-CM | POA: Diagnosis not present

## 2015-12-15 DIAGNOSIS — E785 Hyperlipidemia, unspecified: Secondary | ICD-10-CM | POA: Diagnosis not present

## 2015-12-15 DIAGNOSIS — E46 Unspecified protein-calorie malnutrition: Secondary | ICD-10-CM | POA: Diagnosis not present

## 2015-12-15 DIAGNOSIS — R1310 Dysphagia, unspecified: Secondary | ICD-10-CM | POA: Diagnosis not present

## 2015-12-15 DIAGNOSIS — F0280 Dementia in other diseases classified elsewhere without behavioral disturbance: Secondary | ICD-10-CM | POA: Diagnosis not present

## 2015-12-15 DIAGNOSIS — G301 Alzheimer's disease with late onset: Secondary | ICD-10-CM | POA: Diagnosis not present

## 2015-12-15 DIAGNOSIS — S32009D Unspecified fracture of unspecified lumbar vertebra, subsequent encounter for fracture with routine healing: Secondary | ICD-10-CM | POA: Diagnosis not present

## 2015-12-15 DIAGNOSIS — J441 Chronic obstructive pulmonary disease with (acute) exacerbation: Secondary | ICD-10-CM | POA: Diagnosis not present

## 2015-12-15 DIAGNOSIS — I714 Abdominal aortic aneurysm, without rupture: Secondary | ICD-10-CM | POA: Diagnosis not present

## 2015-12-15 DIAGNOSIS — Z9981 Dependence on supplemental oxygen: Secondary | ICD-10-CM | POA: Diagnosis not present

## 2015-12-16 DIAGNOSIS — F0280 Dementia in other diseases classified elsewhere without behavioral disturbance: Secondary | ICD-10-CM | POA: Diagnosis not present

## 2015-12-16 DIAGNOSIS — Z9981 Dependence on supplemental oxygen: Secondary | ICD-10-CM | POA: Diagnosis not present

## 2015-12-16 DIAGNOSIS — R1310 Dysphagia, unspecified: Secondary | ICD-10-CM | POA: Diagnosis not present

## 2015-12-16 DIAGNOSIS — J441 Chronic obstructive pulmonary disease with (acute) exacerbation: Secondary | ICD-10-CM | POA: Diagnosis not present

## 2015-12-16 DIAGNOSIS — E46 Unspecified protein-calorie malnutrition: Secondary | ICD-10-CM | POA: Diagnosis not present

## 2015-12-16 DIAGNOSIS — G301 Alzheimer's disease with late onset: Secondary | ICD-10-CM | POA: Diagnosis not present

## 2015-12-17 ENCOUNTER — Other Ambulatory Visit: Payer: Self-pay | Admitting: *Deleted

## 2015-12-17 DIAGNOSIS — J441 Chronic obstructive pulmonary disease with (acute) exacerbation: Secondary | ICD-10-CM | POA: Diagnosis not present

## 2015-12-17 DIAGNOSIS — R1310 Dysphagia, unspecified: Secondary | ICD-10-CM | POA: Diagnosis not present

## 2015-12-17 DIAGNOSIS — G301 Alzheimer's disease with late onset: Secondary | ICD-10-CM | POA: Diagnosis not present

## 2015-12-17 DIAGNOSIS — F0280 Dementia in other diseases classified elsewhere without behavioral disturbance: Secondary | ICD-10-CM | POA: Diagnosis not present

## 2015-12-17 DIAGNOSIS — Z9981 Dependence on supplemental oxygen: Secondary | ICD-10-CM | POA: Diagnosis not present

## 2015-12-17 DIAGNOSIS — E46 Unspecified protein-calorie malnutrition: Secondary | ICD-10-CM | POA: Diagnosis not present

## 2015-12-17 MED ORDER — OXYCODONE HCL 5 MG PO TABS: 5.0000 mg | ORAL_TABLET | ORAL | 0 refills | Status: DC | PRN

## 2015-12-18 DIAGNOSIS — E46 Unspecified protein-calorie malnutrition: Secondary | ICD-10-CM | POA: Diagnosis not present

## 2015-12-18 DIAGNOSIS — G301 Alzheimer's disease with late onset: Secondary | ICD-10-CM | POA: Diagnosis not present

## 2015-12-18 DIAGNOSIS — J441 Chronic obstructive pulmonary disease with (acute) exacerbation: Secondary | ICD-10-CM | POA: Diagnosis not present

## 2015-12-18 DIAGNOSIS — Z9981 Dependence on supplemental oxygen: Secondary | ICD-10-CM | POA: Diagnosis not present

## 2015-12-18 DIAGNOSIS — F0280 Dementia in other diseases classified elsewhere without behavioral disturbance: Secondary | ICD-10-CM | POA: Diagnosis not present

## 2015-12-18 DIAGNOSIS — R1310 Dysphagia, unspecified: Secondary | ICD-10-CM | POA: Diagnosis not present

## 2015-12-21 DIAGNOSIS — J441 Chronic obstructive pulmonary disease with (acute) exacerbation: Secondary | ICD-10-CM | POA: Diagnosis not present

## 2015-12-21 DIAGNOSIS — F0280 Dementia in other diseases classified elsewhere without behavioral disturbance: Secondary | ICD-10-CM | POA: Diagnosis not present

## 2015-12-21 DIAGNOSIS — E46 Unspecified protein-calorie malnutrition: Secondary | ICD-10-CM | POA: Diagnosis not present

## 2015-12-21 DIAGNOSIS — R1310 Dysphagia, unspecified: Secondary | ICD-10-CM | POA: Diagnosis not present

## 2015-12-21 DIAGNOSIS — Z9981 Dependence on supplemental oxygen: Secondary | ICD-10-CM | POA: Diagnosis not present

## 2015-12-21 DIAGNOSIS — G301 Alzheimer's disease with late onset: Secondary | ICD-10-CM | POA: Diagnosis not present

## 2015-12-22 DIAGNOSIS — J441 Chronic obstructive pulmonary disease with (acute) exacerbation: Secondary | ICD-10-CM | POA: Diagnosis not present

## 2015-12-22 DIAGNOSIS — R1310 Dysphagia, unspecified: Secondary | ICD-10-CM | POA: Diagnosis not present

## 2015-12-22 DIAGNOSIS — F0280 Dementia in other diseases classified elsewhere without behavioral disturbance: Secondary | ICD-10-CM | POA: Diagnosis not present

## 2015-12-22 DIAGNOSIS — E46 Unspecified protein-calorie malnutrition: Secondary | ICD-10-CM | POA: Diagnosis not present

## 2015-12-22 DIAGNOSIS — G301 Alzheimer's disease with late onset: Secondary | ICD-10-CM | POA: Diagnosis not present

## 2015-12-22 DIAGNOSIS — Z9981 Dependence on supplemental oxygen: Secondary | ICD-10-CM | POA: Diagnosis not present

## 2015-12-23 DIAGNOSIS — F0280 Dementia in other diseases classified elsewhere without behavioral disturbance: Secondary | ICD-10-CM | POA: Diagnosis not present

## 2015-12-23 DIAGNOSIS — J441 Chronic obstructive pulmonary disease with (acute) exacerbation: Secondary | ICD-10-CM | POA: Diagnosis not present

## 2015-12-23 DIAGNOSIS — G301 Alzheimer's disease with late onset: Secondary | ICD-10-CM | POA: Diagnosis not present

## 2015-12-23 DIAGNOSIS — E46 Unspecified protein-calorie malnutrition: Secondary | ICD-10-CM | POA: Diagnosis not present

## 2015-12-23 DIAGNOSIS — Z9981 Dependence on supplemental oxygen: Secondary | ICD-10-CM | POA: Diagnosis not present

## 2015-12-23 DIAGNOSIS — R1310 Dysphagia, unspecified: Secondary | ICD-10-CM | POA: Diagnosis not present

## 2015-12-24 DIAGNOSIS — Z9981 Dependence on supplemental oxygen: Secondary | ICD-10-CM | POA: Diagnosis not present

## 2015-12-24 DIAGNOSIS — R1310 Dysphagia, unspecified: Secondary | ICD-10-CM | POA: Diagnosis not present

## 2015-12-24 DIAGNOSIS — J441 Chronic obstructive pulmonary disease with (acute) exacerbation: Secondary | ICD-10-CM | POA: Diagnosis not present

## 2015-12-24 DIAGNOSIS — E46 Unspecified protein-calorie malnutrition: Secondary | ICD-10-CM | POA: Diagnosis not present

## 2015-12-24 DIAGNOSIS — G301 Alzheimer's disease with late onset: Secondary | ICD-10-CM | POA: Diagnosis not present

## 2015-12-24 DIAGNOSIS — F0280 Dementia in other diseases classified elsewhere without behavioral disturbance: Secondary | ICD-10-CM | POA: Diagnosis not present

## 2015-12-25 DIAGNOSIS — F0280 Dementia in other diseases classified elsewhere without behavioral disturbance: Secondary | ICD-10-CM | POA: Diagnosis not present

## 2015-12-25 DIAGNOSIS — G301 Alzheimer's disease with late onset: Secondary | ICD-10-CM | POA: Diagnosis not present

## 2015-12-25 DIAGNOSIS — Z9981 Dependence on supplemental oxygen: Secondary | ICD-10-CM | POA: Diagnosis not present

## 2015-12-25 DIAGNOSIS — E46 Unspecified protein-calorie malnutrition: Secondary | ICD-10-CM | POA: Diagnosis not present

## 2015-12-25 DIAGNOSIS — J441 Chronic obstructive pulmonary disease with (acute) exacerbation: Secondary | ICD-10-CM | POA: Diagnosis not present

## 2015-12-25 DIAGNOSIS — R1310 Dysphagia, unspecified: Secondary | ICD-10-CM | POA: Diagnosis not present

## 2015-12-28 DIAGNOSIS — G301 Alzheimer's disease with late onset: Secondary | ICD-10-CM | POA: Diagnosis not present

## 2015-12-28 DIAGNOSIS — Z9981 Dependence on supplemental oxygen: Secondary | ICD-10-CM | POA: Diagnosis not present

## 2015-12-28 DIAGNOSIS — F0280 Dementia in other diseases classified elsewhere without behavioral disturbance: Secondary | ICD-10-CM | POA: Diagnosis not present

## 2015-12-28 DIAGNOSIS — E46 Unspecified protein-calorie malnutrition: Secondary | ICD-10-CM | POA: Diagnosis not present

## 2015-12-28 DIAGNOSIS — J441 Chronic obstructive pulmonary disease with (acute) exacerbation: Secondary | ICD-10-CM | POA: Diagnosis not present

## 2015-12-28 DIAGNOSIS — R1310 Dysphagia, unspecified: Secondary | ICD-10-CM | POA: Diagnosis not present

## 2015-12-29 DIAGNOSIS — F0280 Dementia in other diseases classified elsewhere without behavioral disturbance: Secondary | ICD-10-CM | POA: Diagnosis not present

## 2015-12-29 DIAGNOSIS — G301 Alzheimer's disease with late onset: Secondary | ICD-10-CM | POA: Diagnosis not present

## 2015-12-29 DIAGNOSIS — R1310 Dysphagia, unspecified: Secondary | ICD-10-CM | POA: Diagnosis not present

## 2015-12-29 DIAGNOSIS — J441 Chronic obstructive pulmonary disease with (acute) exacerbation: Secondary | ICD-10-CM | POA: Diagnosis not present

## 2015-12-29 DIAGNOSIS — E46 Unspecified protein-calorie malnutrition: Secondary | ICD-10-CM | POA: Diagnosis not present

## 2015-12-29 DIAGNOSIS — Z9981 Dependence on supplemental oxygen: Secondary | ICD-10-CM | POA: Diagnosis not present

## 2015-12-30 DIAGNOSIS — G301 Alzheimer's disease with late onset: Secondary | ICD-10-CM | POA: Diagnosis not present

## 2015-12-30 DIAGNOSIS — F0280 Dementia in other diseases classified elsewhere without behavioral disturbance: Secondary | ICD-10-CM | POA: Diagnosis not present

## 2015-12-30 DIAGNOSIS — Z9981 Dependence on supplemental oxygen: Secondary | ICD-10-CM | POA: Diagnosis not present

## 2015-12-30 DIAGNOSIS — E46 Unspecified protein-calorie malnutrition: Secondary | ICD-10-CM | POA: Diagnosis not present

## 2015-12-30 DIAGNOSIS — R1310 Dysphagia, unspecified: Secondary | ICD-10-CM | POA: Diagnosis not present

## 2015-12-30 DIAGNOSIS — J441 Chronic obstructive pulmonary disease with (acute) exacerbation: Secondary | ICD-10-CM | POA: Diagnosis not present

## 2015-12-31 DIAGNOSIS — G301 Alzheimer's disease with late onset: Secondary | ICD-10-CM | POA: Diagnosis not present

## 2015-12-31 DIAGNOSIS — J441 Chronic obstructive pulmonary disease with (acute) exacerbation: Secondary | ICD-10-CM | POA: Diagnosis not present

## 2015-12-31 DIAGNOSIS — R1310 Dysphagia, unspecified: Secondary | ICD-10-CM | POA: Diagnosis not present

## 2015-12-31 DIAGNOSIS — E46 Unspecified protein-calorie malnutrition: Secondary | ICD-10-CM | POA: Diagnosis not present

## 2015-12-31 DIAGNOSIS — F0280 Dementia in other diseases classified elsewhere without behavioral disturbance: Secondary | ICD-10-CM | POA: Diagnosis not present

## 2015-12-31 DIAGNOSIS — Z9981 Dependence on supplemental oxygen: Secondary | ICD-10-CM | POA: Diagnosis not present

## 2016-01-01 DIAGNOSIS — R1310 Dysphagia, unspecified: Secondary | ICD-10-CM | POA: Diagnosis not present

## 2016-01-01 DIAGNOSIS — E46 Unspecified protein-calorie malnutrition: Secondary | ICD-10-CM | POA: Diagnosis not present

## 2016-01-01 DIAGNOSIS — Z9981 Dependence on supplemental oxygen: Secondary | ICD-10-CM | POA: Diagnosis not present

## 2016-01-01 DIAGNOSIS — F0280 Dementia in other diseases classified elsewhere without behavioral disturbance: Secondary | ICD-10-CM | POA: Diagnosis not present

## 2016-01-01 DIAGNOSIS — J441 Chronic obstructive pulmonary disease with (acute) exacerbation: Secondary | ICD-10-CM | POA: Diagnosis not present

## 2016-01-01 DIAGNOSIS — G301 Alzheimer's disease with late onset: Secondary | ICD-10-CM | POA: Diagnosis not present

## 2016-01-04 DIAGNOSIS — G301 Alzheimer's disease with late onset: Secondary | ICD-10-CM | POA: Diagnosis not present

## 2016-01-04 DIAGNOSIS — R1310 Dysphagia, unspecified: Secondary | ICD-10-CM | POA: Diagnosis not present

## 2016-01-04 DIAGNOSIS — Z9981 Dependence on supplemental oxygen: Secondary | ICD-10-CM | POA: Diagnosis not present

## 2016-01-04 DIAGNOSIS — J441 Chronic obstructive pulmonary disease with (acute) exacerbation: Secondary | ICD-10-CM | POA: Diagnosis not present

## 2016-01-04 DIAGNOSIS — F0280 Dementia in other diseases classified elsewhere without behavioral disturbance: Secondary | ICD-10-CM | POA: Diagnosis not present

## 2016-01-04 DIAGNOSIS — E46 Unspecified protein-calorie malnutrition: Secondary | ICD-10-CM | POA: Diagnosis not present

## 2016-01-05 DIAGNOSIS — Z9981 Dependence on supplemental oxygen: Secondary | ICD-10-CM | POA: Diagnosis not present

## 2016-01-05 DIAGNOSIS — E46 Unspecified protein-calorie malnutrition: Secondary | ICD-10-CM | POA: Diagnosis not present

## 2016-01-05 DIAGNOSIS — R1310 Dysphagia, unspecified: Secondary | ICD-10-CM | POA: Diagnosis not present

## 2016-01-05 DIAGNOSIS — F0280 Dementia in other diseases classified elsewhere without behavioral disturbance: Secondary | ICD-10-CM | POA: Diagnosis not present

## 2016-01-05 DIAGNOSIS — J441 Chronic obstructive pulmonary disease with (acute) exacerbation: Secondary | ICD-10-CM | POA: Diagnosis not present

## 2016-01-05 DIAGNOSIS — G301 Alzheimer's disease with late onset: Secondary | ICD-10-CM | POA: Diagnosis not present

## 2016-01-06 DIAGNOSIS — E46 Unspecified protein-calorie malnutrition: Secondary | ICD-10-CM | POA: Diagnosis not present

## 2016-01-06 DIAGNOSIS — R1310 Dysphagia, unspecified: Secondary | ICD-10-CM | POA: Diagnosis not present

## 2016-01-06 DIAGNOSIS — J441 Chronic obstructive pulmonary disease with (acute) exacerbation: Secondary | ICD-10-CM | POA: Diagnosis not present

## 2016-01-06 DIAGNOSIS — Z9981 Dependence on supplemental oxygen: Secondary | ICD-10-CM | POA: Diagnosis not present

## 2016-01-06 DIAGNOSIS — G301 Alzheimer's disease with late onset: Secondary | ICD-10-CM | POA: Diagnosis not present

## 2016-01-06 DIAGNOSIS — F0280 Dementia in other diseases classified elsewhere without behavioral disturbance: Secondary | ICD-10-CM | POA: Diagnosis not present

## 2016-01-07 DIAGNOSIS — Z9981 Dependence on supplemental oxygen: Secondary | ICD-10-CM | POA: Diagnosis not present

## 2016-01-07 DIAGNOSIS — J441 Chronic obstructive pulmonary disease with (acute) exacerbation: Secondary | ICD-10-CM | POA: Diagnosis not present

## 2016-01-07 DIAGNOSIS — F0280 Dementia in other diseases classified elsewhere without behavioral disturbance: Secondary | ICD-10-CM | POA: Diagnosis not present

## 2016-01-07 DIAGNOSIS — G301 Alzheimer's disease with late onset: Secondary | ICD-10-CM | POA: Diagnosis not present

## 2016-01-07 DIAGNOSIS — R1310 Dysphagia, unspecified: Secondary | ICD-10-CM | POA: Diagnosis not present

## 2016-01-07 DIAGNOSIS — E46 Unspecified protein-calorie malnutrition: Secondary | ICD-10-CM | POA: Diagnosis not present

## 2016-01-08 DIAGNOSIS — F0280 Dementia in other diseases classified elsewhere without behavioral disturbance: Secondary | ICD-10-CM | POA: Diagnosis not present

## 2016-01-08 DIAGNOSIS — J441 Chronic obstructive pulmonary disease with (acute) exacerbation: Secondary | ICD-10-CM | POA: Diagnosis not present

## 2016-01-08 DIAGNOSIS — E46 Unspecified protein-calorie malnutrition: Secondary | ICD-10-CM | POA: Diagnosis not present

## 2016-01-08 DIAGNOSIS — G301 Alzheimer's disease with late onset: Secondary | ICD-10-CM | POA: Diagnosis not present

## 2016-01-08 DIAGNOSIS — Z9981 Dependence on supplemental oxygen: Secondary | ICD-10-CM | POA: Diagnosis not present

## 2016-01-08 DIAGNOSIS — R1310 Dysphagia, unspecified: Secondary | ICD-10-CM | POA: Diagnosis not present

## 2016-01-11 DIAGNOSIS — E46 Unspecified protein-calorie malnutrition: Secondary | ICD-10-CM | POA: Diagnosis not present

## 2016-01-11 DIAGNOSIS — G301 Alzheimer's disease with late onset: Secondary | ICD-10-CM | POA: Diagnosis not present

## 2016-01-11 DIAGNOSIS — F0280 Dementia in other diseases classified elsewhere without behavioral disturbance: Secondary | ICD-10-CM | POA: Diagnosis not present

## 2016-01-11 DIAGNOSIS — R1310 Dysphagia, unspecified: Secondary | ICD-10-CM | POA: Diagnosis not present

## 2016-01-11 DIAGNOSIS — J441 Chronic obstructive pulmonary disease with (acute) exacerbation: Secondary | ICD-10-CM | POA: Diagnosis not present

## 2016-01-11 DIAGNOSIS — Z9981 Dependence on supplemental oxygen: Secondary | ICD-10-CM | POA: Diagnosis not present

## 2016-01-12 DIAGNOSIS — J441 Chronic obstructive pulmonary disease with (acute) exacerbation: Secondary | ICD-10-CM | POA: Diagnosis not present

## 2016-01-12 DIAGNOSIS — F0280 Dementia in other diseases classified elsewhere without behavioral disturbance: Secondary | ICD-10-CM | POA: Diagnosis not present

## 2016-01-12 DIAGNOSIS — R1310 Dysphagia, unspecified: Secondary | ICD-10-CM | POA: Diagnosis not present

## 2016-01-12 DIAGNOSIS — E46 Unspecified protein-calorie malnutrition: Secondary | ICD-10-CM | POA: Diagnosis not present

## 2016-01-12 DIAGNOSIS — G301 Alzheimer's disease with late onset: Secondary | ICD-10-CM | POA: Diagnosis not present

## 2016-01-12 DIAGNOSIS — Z9981 Dependence on supplemental oxygen: Secondary | ICD-10-CM | POA: Diagnosis not present

## 2016-01-13 DIAGNOSIS — E46 Unspecified protein-calorie malnutrition: Secondary | ICD-10-CM | POA: Diagnosis not present

## 2016-01-13 DIAGNOSIS — F0280 Dementia in other diseases classified elsewhere without behavioral disturbance: Secondary | ICD-10-CM | POA: Diagnosis not present

## 2016-01-13 DIAGNOSIS — G301 Alzheimer's disease with late onset: Secondary | ICD-10-CM | POA: Diagnosis not present

## 2016-01-13 DIAGNOSIS — Z9981 Dependence on supplemental oxygen: Secondary | ICD-10-CM | POA: Diagnosis not present

## 2016-01-13 DIAGNOSIS — J441 Chronic obstructive pulmonary disease with (acute) exacerbation: Secondary | ICD-10-CM | POA: Diagnosis not present

## 2016-01-13 DIAGNOSIS — R1310 Dysphagia, unspecified: Secondary | ICD-10-CM | POA: Diagnosis not present

## 2016-01-14 DIAGNOSIS — F0280 Dementia in other diseases classified elsewhere without behavioral disturbance: Secondary | ICD-10-CM | POA: Diagnosis not present

## 2016-01-14 DIAGNOSIS — J441 Chronic obstructive pulmonary disease with (acute) exacerbation: Secondary | ICD-10-CM | POA: Diagnosis not present

## 2016-01-14 DIAGNOSIS — R1310 Dysphagia, unspecified: Secondary | ICD-10-CM | POA: Diagnosis not present

## 2016-01-14 DIAGNOSIS — Z9981 Dependence on supplemental oxygen: Secondary | ICD-10-CM | POA: Diagnosis not present

## 2016-01-14 DIAGNOSIS — G301 Alzheimer's disease with late onset: Secondary | ICD-10-CM | POA: Diagnosis not present

## 2016-01-14 DIAGNOSIS — E46 Unspecified protein-calorie malnutrition: Secondary | ICD-10-CM | POA: Diagnosis not present

## 2016-01-15 DIAGNOSIS — F0280 Dementia in other diseases classified elsewhere without behavioral disturbance: Secondary | ICD-10-CM | POA: Diagnosis not present

## 2016-01-15 DIAGNOSIS — I714 Abdominal aortic aneurysm, without rupture: Secondary | ICD-10-CM | POA: Diagnosis not present

## 2016-01-15 DIAGNOSIS — R1310 Dysphagia, unspecified: Secondary | ICD-10-CM | POA: Diagnosis not present

## 2016-01-15 DIAGNOSIS — E46 Unspecified protein-calorie malnutrition: Secondary | ICD-10-CM | POA: Diagnosis not present

## 2016-01-15 DIAGNOSIS — J441 Chronic obstructive pulmonary disease with (acute) exacerbation: Secondary | ICD-10-CM | POA: Diagnosis not present

## 2016-01-15 DIAGNOSIS — E785 Hyperlipidemia, unspecified: Secondary | ICD-10-CM | POA: Diagnosis not present

## 2016-01-15 DIAGNOSIS — Z9981 Dependence on supplemental oxygen: Secondary | ICD-10-CM | POA: Diagnosis not present

## 2016-01-15 DIAGNOSIS — G301 Alzheimer's disease with late onset: Secondary | ICD-10-CM | POA: Diagnosis not present

## 2016-01-15 DIAGNOSIS — S32009D Unspecified fracture of unspecified lumbar vertebra, subsequent encounter for fracture with routine healing: Secondary | ICD-10-CM | POA: Diagnosis not present

## 2016-01-18 DIAGNOSIS — E46 Unspecified protein-calorie malnutrition: Secondary | ICD-10-CM | POA: Diagnosis not present

## 2016-01-18 DIAGNOSIS — G301 Alzheimer's disease with late onset: Secondary | ICD-10-CM | POA: Diagnosis not present

## 2016-01-18 DIAGNOSIS — R1310 Dysphagia, unspecified: Secondary | ICD-10-CM | POA: Diagnosis not present

## 2016-01-18 DIAGNOSIS — Z9981 Dependence on supplemental oxygen: Secondary | ICD-10-CM | POA: Diagnosis not present

## 2016-01-18 DIAGNOSIS — F0280 Dementia in other diseases classified elsewhere without behavioral disturbance: Secondary | ICD-10-CM | POA: Diagnosis not present

## 2016-01-18 DIAGNOSIS — J441 Chronic obstructive pulmonary disease with (acute) exacerbation: Secondary | ICD-10-CM | POA: Diagnosis not present

## 2016-01-19 ENCOUNTER — Other Ambulatory Visit: Payer: Self-pay | Admitting: Family Medicine

## 2016-01-19 DIAGNOSIS — G301 Alzheimer's disease with late onset: Secondary | ICD-10-CM | POA: Diagnosis not present

## 2016-01-19 DIAGNOSIS — E46 Unspecified protein-calorie malnutrition: Secondary | ICD-10-CM | POA: Diagnosis not present

## 2016-01-19 DIAGNOSIS — R1310 Dysphagia, unspecified: Secondary | ICD-10-CM | POA: Diagnosis not present

## 2016-01-19 DIAGNOSIS — F0280 Dementia in other diseases classified elsewhere without behavioral disturbance: Secondary | ICD-10-CM | POA: Diagnosis not present

## 2016-01-19 DIAGNOSIS — J441 Chronic obstructive pulmonary disease with (acute) exacerbation: Secondary | ICD-10-CM | POA: Diagnosis not present

## 2016-01-19 DIAGNOSIS — Z9981 Dependence on supplemental oxygen: Secondary | ICD-10-CM | POA: Diagnosis not present

## 2016-01-19 MED ORDER — OXYCODONE HCL 5 MG PO TABS: 5.0000 mg | ORAL_TABLET | ORAL | 0 refills | Status: DC | PRN

## 2016-01-19 NOTE — Telephone Encounter (Signed)
Adam Singleton with Adam Singleton is requesting a refill for oxyCODONE (OXY IR/ROXICODONE) 5 MG immediate release tablet to [facility]. Last written: 12/17/15 Lat OV: 08/04/15 Please advise. Thanks TNP

## 2016-01-20 DIAGNOSIS — E46 Unspecified protein-calorie malnutrition: Secondary | ICD-10-CM | POA: Diagnosis not present

## 2016-01-20 DIAGNOSIS — Z9981 Dependence on supplemental oxygen: Secondary | ICD-10-CM | POA: Diagnosis not present

## 2016-01-20 DIAGNOSIS — F0280 Dementia in other diseases classified elsewhere without behavioral disturbance: Secondary | ICD-10-CM | POA: Diagnosis not present

## 2016-01-20 DIAGNOSIS — J441 Chronic obstructive pulmonary disease with (acute) exacerbation: Secondary | ICD-10-CM | POA: Diagnosis not present

## 2016-01-20 DIAGNOSIS — G301 Alzheimer's disease with late onset: Secondary | ICD-10-CM | POA: Diagnosis not present

## 2016-01-20 DIAGNOSIS — R1310 Dysphagia, unspecified: Secondary | ICD-10-CM | POA: Diagnosis not present

## 2016-01-21 DIAGNOSIS — J441 Chronic obstructive pulmonary disease with (acute) exacerbation: Secondary | ICD-10-CM | POA: Diagnosis not present

## 2016-01-21 DIAGNOSIS — R1310 Dysphagia, unspecified: Secondary | ICD-10-CM | POA: Diagnosis not present

## 2016-01-21 DIAGNOSIS — Z9981 Dependence on supplemental oxygen: Secondary | ICD-10-CM | POA: Diagnosis not present

## 2016-01-21 DIAGNOSIS — E46 Unspecified protein-calorie malnutrition: Secondary | ICD-10-CM | POA: Diagnosis not present

## 2016-01-21 DIAGNOSIS — G301 Alzheimer's disease with late onset: Secondary | ICD-10-CM | POA: Diagnosis not present

## 2016-01-21 DIAGNOSIS — F0280 Dementia in other diseases classified elsewhere without behavioral disturbance: Secondary | ICD-10-CM | POA: Diagnosis not present

## 2016-01-22 DIAGNOSIS — F0280 Dementia in other diseases classified elsewhere without behavioral disturbance: Secondary | ICD-10-CM | POA: Diagnosis not present

## 2016-01-22 DIAGNOSIS — R1310 Dysphagia, unspecified: Secondary | ICD-10-CM | POA: Diagnosis not present

## 2016-01-22 DIAGNOSIS — J441 Chronic obstructive pulmonary disease with (acute) exacerbation: Secondary | ICD-10-CM | POA: Diagnosis not present

## 2016-01-22 DIAGNOSIS — Z9981 Dependence on supplemental oxygen: Secondary | ICD-10-CM | POA: Diagnosis not present

## 2016-01-22 DIAGNOSIS — G301 Alzheimer's disease with late onset: Secondary | ICD-10-CM | POA: Diagnosis not present

## 2016-01-22 DIAGNOSIS — E46 Unspecified protein-calorie malnutrition: Secondary | ICD-10-CM | POA: Diagnosis not present

## 2016-01-25 DIAGNOSIS — G301 Alzheimer's disease with late onset: Secondary | ICD-10-CM | POA: Diagnosis not present

## 2016-01-25 DIAGNOSIS — F0280 Dementia in other diseases classified elsewhere without behavioral disturbance: Secondary | ICD-10-CM | POA: Diagnosis not present

## 2016-01-25 DIAGNOSIS — J441 Chronic obstructive pulmonary disease with (acute) exacerbation: Secondary | ICD-10-CM | POA: Diagnosis not present

## 2016-01-25 DIAGNOSIS — Z9981 Dependence on supplemental oxygen: Secondary | ICD-10-CM | POA: Diagnosis not present

## 2016-01-25 DIAGNOSIS — E46 Unspecified protein-calorie malnutrition: Secondary | ICD-10-CM | POA: Diagnosis not present

## 2016-01-25 DIAGNOSIS — R1310 Dysphagia, unspecified: Secondary | ICD-10-CM | POA: Diagnosis not present

## 2016-01-26 DIAGNOSIS — J441 Chronic obstructive pulmonary disease with (acute) exacerbation: Secondary | ICD-10-CM | POA: Diagnosis not present

## 2016-01-26 DIAGNOSIS — Z9981 Dependence on supplemental oxygen: Secondary | ICD-10-CM | POA: Diagnosis not present

## 2016-01-26 DIAGNOSIS — E46 Unspecified protein-calorie malnutrition: Secondary | ICD-10-CM | POA: Diagnosis not present

## 2016-01-26 DIAGNOSIS — F0280 Dementia in other diseases classified elsewhere without behavioral disturbance: Secondary | ICD-10-CM | POA: Diagnosis not present

## 2016-01-26 DIAGNOSIS — G301 Alzheimer's disease with late onset: Secondary | ICD-10-CM | POA: Diagnosis not present

## 2016-01-26 DIAGNOSIS — R1310 Dysphagia, unspecified: Secondary | ICD-10-CM | POA: Diagnosis not present

## 2016-01-27 DIAGNOSIS — G301 Alzheimer's disease with late onset: Secondary | ICD-10-CM | POA: Diagnosis not present

## 2016-01-27 DIAGNOSIS — F0280 Dementia in other diseases classified elsewhere without behavioral disturbance: Secondary | ICD-10-CM | POA: Diagnosis not present

## 2016-01-27 DIAGNOSIS — J441 Chronic obstructive pulmonary disease with (acute) exacerbation: Secondary | ICD-10-CM | POA: Diagnosis not present

## 2016-01-27 DIAGNOSIS — R1310 Dysphagia, unspecified: Secondary | ICD-10-CM | POA: Diagnosis not present

## 2016-01-27 DIAGNOSIS — Z9981 Dependence on supplemental oxygen: Secondary | ICD-10-CM | POA: Diagnosis not present

## 2016-01-27 DIAGNOSIS — E46 Unspecified protein-calorie malnutrition: Secondary | ICD-10-CM | POA: Diagnosis not present

## 2016-01-28 DIAGNOSIS — G301 Alzheimer's disease with late onset: Secondary | ICD-10-CM | POA: Diagnosis not present

## 2016-01-28 DIAGNOSIS — R1310 Dysphagia, unspecified: Secondary | ICD-10-CM | POA: Diagnosis not present

## 2016-01-28 DIAGNOSIS — Z9981 Dependence on supplemental oxygen: Secondary | ICD-10-CM | POA: Diagnosis not present

## 2016-01-28 DIAGNOSIS — E46 Unspecified protein-calorie malnutrition: Secondary | ICD-10-CM | POA: Diagnosis not present

## 2016-01-28 DIAGNOSIS — F0280 Dementia in other diseases classified elsewhere without behavioral disturbance: Secondary | ICD-10-CM | POA: Diagnosis not present

## 2016-01-28 DIAGNOSIS — J441 Chronic obstructive pulmonary disease with (acute) exacerbation: Secondary | ICD-10-CM | POA: Diagnosis not present

## 2016-01-29 DIAGNOSIS — F0280 Dementia in other diseases classified elsewhere without behavioral disturbance: Secondary | ICD-10-CM | POA: Diagnosis not present

## 2016-01-29 DIAGNOSIS — E46 Unspecified protein-calorie malnutrition: Secondary | ICD-10-CM | POA: Diagnosis not present

## 2016-01-29 DIAGNOSIS — Z9981 Dependence on supplemental oxygen: Secondary | ICD-10-CM | POA: Diagnosis not present

## 2016-01-29 DIAGNOSIS — G301 Alzheimer's disease with late onset: Secondary | ICD-10-CM | POA: Diagnosis not present

## 2016-01-29 DIAGNOSIS — R1310 Dysphagia, unspecified: Secondary | ICD-10-CM | POA: Diagnosis not present

## 2016-01-29 DIAGNOSIS — J441 Chronic obstructive pulmonary disease with (acute) exacerbation: Secondary | ICD-10-CM | POA: Diagnosis not present

## 2016-02-01 DIAGNOSIS — G301 Alzheimer's disease with late onset: Secondary | ICD-10-CM | POA: Diagnosis not present

## 2016-02-01 DIAGNOSIS — F0280 Dementia in other diseases classified elsewhere without behavioral disturbance: Secondary | ICD-10-CM | POA: Diagnosis not present

## 2016-02-01 DIAGNOSIS — E46 Unspecified protein-calorie malnutrition: Secondary | ICD-10-CM | POA: Diagnosis not present

## 2016-02-01 DIAGNOSIS — Z9981 Dependence on supplemental oxygen: Secondary | ICD-10-CM | POA: Diagnosis not present

## 2016-02-01 DIAGNOSIS — J441 Chronic obstructive pulmonary disease with (acute) exacerbation: Secondary | ICD-10-CM | POA: Diagnosis not present

## 2016-02-01 DIAGNOSIS — R1310 Dysphagia, unspecified: Secondary | ICD-10-CM | POA: Diagnosis not present

## 2016-02-02 DIAGNOSIS — G301 Alzheimer's disease with late onset: Secondary | ICD-10-CM | POA: Diagnosis not present

## 2016-02-02 DIAGNOSIS — E46 Unspecified protein-calorie malnutrition: Secondary | ICD-10-CM | POA: Diagnosis not present

## 2016-02-02 DIAGNOSIS — J441 Chronic obstructive pulmonary disease with (acute) exacerbation: Secondary | ICD-10-CM | POA: Diagnosis not present

## 2016-02-02 DIAGNOSIS — Z9981 Dependence on supplemental oxygen: Secondary | ICD-10-CM | POA: Diagnosis not present

## 2016-02-02 DIAGNOSIS — R1310 Dysphagia, unspecified: Secondary | ICD-10-CM | POA: Diagnosis not present

## 2016-02-02 DIAGNOSIS — F0280 Dementia in other diseases classified elsewhere without behavioral disturbance: Secondary | ICD-10-CM | POA: Diagnosis not present

## 2016-02-03 DIAGNOSIS — E46 Unspecified protein-calorie malnutrition: Secondary | ICD-10-CM | POA: Diagnosis not present

## 2016-02-03 DIAGNOSIS — F0280 Dementia in other diseases classified elsewhere without behavioral disturbance: Secondary | ICD-10-CM | POA: Diagnosis not present

## 2016-02-03 DIAGNOSIS — G301 Alzheimer's disease with late onset: Secondary | ICD-10-CM | POA: Diagnosis not present

## 2016-02-03 DIAGNOSIS — J441 Chronic obstructive pulmonary disease with (acute) exacerbation: Secondary | ICD-10-CM | POA: Diagnosis not present

## 2016-02-03 DIAGNOSIS — Z9981 Dependence on supplemental oxygen: Secondary | ICD-10-CM | POA: Diagnosis not present

## 2016-02-03 DIAGNOSIS — R1310 Dysphagia, unspecified: Secondary | ICD-10-CM | POA: Diagnosis not present

## 2016-02-04 DIAGNOSIS — J441 Chronic obstructive pulmonary disease with (acute) exacerbation: Secondary | ICD-10-CM | POA: Diagnosis not present

## 2016-02-04 DIAGNOSIS — Z9981 Dependence on supplemental oxygen: Secondary | ICD-10-CM | POA: Diagnosis not present

## 2016-02-04 DIAGNOSIS — F0280 Dementia in other diseases classified elsewhere without behavioral disturbance: Secondary | ICD-10-CM | POA: Diagnosis not present

## 2016-02-04 DIAGNOSIS — R1310 Dysphagia, unspecified: Secondary | ICD-10-CM | POA: Diagnosis not present

## 2016-02-04 DIAGNOSIS — G301 Alzheimer's disease with late onset: Secondary | ICD-10-CM | POA: Diagnosis not present

## 2016-02-04 DIAGNOSIS — E46 Unspecified protein-calorie malnutrition: Secondary | ICD-10-CM | POA: Diagnosis not present

## 2016-02-05 ENCOUNTER — Telehealth: Payer: Self-pay

## 2016-02-05 DIAGNOSIS — R1310 Dysphagia, unspecified: Secondary | ICD-10-CM | POA: Diagnosis not present

## 2016-02-05 DIAGNOSIS — Z9981 Dependence on supplemental oxygen: Secondary | ICD-10-CM | POA: Diagnosis not present

## 2016-02-05 DIAGNOSIS — F0280 Dementia in other diseases classified elsewhere without behavioral disturbance: Secondary | ICD-10-CM | POA: Diagnosis not present

## 2016-02-05 DIAGNOSIS — J441 Chronic obstructive pulmonary disease with (acute) exacerbation: Secondary | ICD-10-CM | POA: Diagnosis not present

## 2016-02-05 DIAGNOSIS — E46 Unspecified protein-calorie malnutrition: Secondary | ICD-10-CM | POA: Diagnosis not present

## 2016-02-05 DIAGNOSIS — G301 Alzheimer's disease with late onset: Secondary | ICD-10-CM | POA: Diagnosis not present

## 2016-02-05 NOTE — Telephone Encounter (Signed)
Stacy with Hosp[facility] called asking for verbal order for Miralax and Dulcolax PRN for constipation. Is already taking Senna 2 tabs BID. Dr. Caryn Section is out of town, and Grace Bushy gave verbal okay. Stacy advised. Renaldo Fiddler, CMA

## 2016-02-06 DIAGNOSIS — G301 Alzheimer's disease with late onset: Secondary | ICD-10-CM | POA: Diagnosis not present

## 2016-02-06 DIAGNOSIS — R1310 Dysphagia, unspecified: Secondary | ICD-10-CM | POA: Diagnosis not present

## 2016-02-06 DIAGNOSIS — F0280 Dementia in other diseases classified elsewhere without behavioral disturbance: Secondary | ICD-10-CM | POA: Diagnosis not present

## 2016-02-06 DIAGNOSIS — J441 Chronic obstructive pulmonary disease with (acute) exacerbation: Secondary | ICD-10-CM | POA: Diagnosis not present

## 2016-02-06 DIAGNOSIS — Z9981 Dependence on supplemental oxygen: Secondary | ICD-10-CM | POA: Diagnosis not present

## 2016-02-06 DIAGNOSIS — E46 Unspecified protein-calorie malnutrition: Secondary | ICD-10-CM | POA: Diagnosis not present

## 2016-02-08 DIAGNOSIS — G301 Alzheimer's disease with late onset: Secondary | ICD-10-CM | POA: Diagnosis not present

## 2016-02-08 DIAGNOSIS — E46 Unspecified protein-calorie malnutrition: Secondary | ICD-10-CM | POA: Diagnosis not present

## 2016-02-08 DIAGNOSIS — Z9981 Dependence on supplemental oxygen: Secondary | ICD-10-CM | POA: Diagnosis not present

## 2016-02-08 DIAGNOSIS — F0280 Dementia in other diseases classified elsewhere without behavioral disturbance: Secondary | ICD-10-CM | POA: Diagnosis not present

## 2016-02-08 DIAGNOSIS — J441 Chronic obstructive pulmonary disease with (acute) exacerbation: Secondary | ICD-10-CM | POA: Diagnosis not present

## 2016-02-08 DIAGNOSIS — R1310 Dysphagia, unspecified: Secondary | ICD-10-CM | POA: Diagnosis not present

## 2016-02-09 DIAGNOSIS — F0280 Dementia in other diseases classified elsewhere without behavioral disturbance: Secondary | ICD-10-CM | POA: Diagnosis not present

## 2016-02-09 DIAGNOSIS — G301 Alzheimer's disease with late onset: Secondary | ICD-10-CM | POA: Diagnosis not present

## 2016-02-09 DIAGNOSIS — J441 Chronic obstructive pulmonary disease with (acute) exacerbation: Secondary | ICD-10-CM | POA: Diagnosis not present

## 2016-02-09 DIAGNOSIS — E46 Unspecified protein-calorie malnutrition: Secondary | ICD-10-CM | POA: Diagnosis not present

## 2016-02-09 DIAGNOSIS — R1310 Dysphagia, unspecified: Secondary | ICD-10-CM | POA: Diagnosis not present

## 2016-02-09 DIAGNOSIS — Z9981 Dependence on supplemental oxygen: Secondary | ICD-10-CM | POA: Diagnosis not present

## 2016-02-10 DIAGNOSIS — E46 Unspecified protein-calorie malnutrition: Secondary | ICD-10-CM | POA: Diagnosis not present

## 2016-02-10 DIAGNOSIS — F0280 Dementia in other diseases classified elsewhere without behavioral disturbance: Secondary | ICD-10-CM | POA: Diagnosis not present

## 2016-02-10 DIAGNOSIS — Z9981 Dependence on supplemental oxygen: Secondary | ICD-10-CM | POA: Diagnosis not present

## 2016-02-10 DIAGNOSIS — R1310 Dysphagia, unspecified: Secondary | ICD-10-CM | POA: Diagnosis not present

## 2016-02-10 DIAGNOSIS — G301 Alzheimer's disease with late onset: Secondary | ICD-10-CM | POA: Diagnosis not present

## 2016-02-10 DIAGNOSIS — J441 Chronic obstructive pulmonary disease with (acute) exacerbation: Secondary | ICD-10-CM | POA: Diagnosis not present

## 2016-02-11 DIAGNOSIS — G301 Alzheimer's disease with late onset: Secondary | ICD-10-CM | POA: Diagnosis not present

## 2016-02-11 DIAGNOSIS — R1310 Dysphagia, unspecified: Secondary | ICD-10-CM | POA: Diagnosis not present

## 2016-02-11 DIAGNOSIS — J441 Chronic obstructive pulmonary disease with (acute) exacerbation: Secondary | ICD-10-CM | POA: Diagnosis not present

## 2016-02-11 DIAGNOSIS — F0280 Dementia in other diseases classified elsewhere without behavioral disturbance: Secondary | ICD-10-CM | POA: Diagnosis not present

## 2016-02-11 DIAGNOSIS — E46 Unspecified protein-calorie malnutrition: Secondary | ICD-10-CM | POA: Diagnosis not present

## 2016-02-11 DIAGNOSIS — Z9981 Dependence on supplemental oxygen: Secondary | ICD-10-CM | POA: Diagnosis not present

## 2016-02-12 DIAGNOSIS — R1310 Dysphagia, unspecified: Secondary | ICD-10-CM | POA: Diagnosis not present

## 2016-02-12 DIAGNOSIS — F0280 Dementia in other diseases classified elsewhere without behavioral disturbance: Secondary | ICD-10-CM | POA: Diagnosis not present

## 2016-02-12 DIAGNOSIS — E46 Unspecified protein-calorie malnutrition: Secondary | ICD-10-CM | POA: Diagnosis not present

## 2016-02-12 DIAGNOSIS — J441 Chronic obstructive pulmonary disease with (acute) exacerbation: Secondary | ICD-10-CM | POA: Diagnosis not present

## 2016-02-12 DIAGNOSIS — Z9981 Dependence on supplemental oxygen: Secondary | ICD-10-CM | POA: Diagnosis not present

## 2016-02-12 DIAGNOSIS — G301 Alzheimer's disease with late onset: Secondary | ICD-10-CM | POA: Diagnosis not present

## 2016-02-14 DIAGNOSIS — R1310 Dysphagia, unspecified: Secondary | ICD-10-CM | POA: Diagnosis not present

## 2016-02-14 DIAGNOSIS — J441 Chronic obstructive pulmonary disease with (acute) exacerbation: Secondary | ICD-10-CM | POA: Diagnosis not present

## 2016-02-14 DIAGNOSIS — F0280 Dementia in other diseases classified elsewhere without behavioral disturbance: Secondary | ICD-10-CM | POA: Diagnosis not present

## 2016-02-14 DIAGNOSIS — I714 Abdominal aortic aneurysm, without rupture: Secondary | ICD-10-CM | POA: Diagnosis not present

## 2016-02-14 DIAGNOSIS — Z9981 Dependence on supplemental oxygen: Secondary | ICD-10-CM | POA: Diagnosis not present

## 2016-02-14 DIAGNOSIS — S32009D Unspecified fracture of unspecified lumbar vertebra, subsequent encounter for fracture with routine healing: Secondary | ICD-10-CM | POA: Diagnosis not present

## 2016-02-14 DIAGNOSIS — E785 Hyperlipidemia, unspecified: Secondary | ICD-10-CM | POA: Diagnosis not present

## 2016-02-14 DIAGNOSIS — E46 Unspecified protein-calorie malnutrition: Secondary | ICD-10-CM | POA: Diagnosis not present

## 2016-02-14 DIAGNOSIS — G301 Alzheimer's disease with late onset: Secondary | ICD-10-CM | POA: Diagnosis not present

## 2016-02-15 DIAGNOSIS — R1310 Dysphagia, unspecified: Secondary | ICD-10-CM | POA: Diagnosis not present

## 2016-02-15 DIAGNOSIS — Z9981 Dependence on supplemental oxygen: Secondary | ICD-10-CM | POA: Diagnosis not present

## 2016-02-15 DIAGNOSIS — G301 Alzheimer's disease with late onset: Secondary | ICD-10-CM | POA: Diagnosis not present

## 2016-02-15 DIAGNOSIS — F0280 Dementia in other diseases classified elsewhere without behavioral disturbance: Secondary | ICD-10-CM | POA: Diagnosis not present

## 2016-02-15 DIAGNOSIS — J441 Chronic obstructive pulmonary disease with (acute) exacerbation: Secondary | ICD-10-CM | POA: Diagnosis not present

## 2016-02-15 DIAGNOSIS — E46 Unspecified protein-calorie malnutrition: Secondary | ICD-10-CM | POA: Diagnosis not present

## 2016-02-16 ENCOUNTER — Telehealth: Payer: Self-pay | Admitting: Family Medicine

## 2016-02-16 DIAGNOSIS — E46 Unspecified protein-calorie malnutrition: Secondary | ICD-10-CM | POA: Diagnosis not present

## 2016-02-16 DIAGNOSIS — F0280 Dementia in other diseases classified elsewhere without behavioral disturbance: Secondary | ICD-10-CM | POA: Diagnosis not present

## 2016-02-16 DIAGNOSIS — R1310 Dysphagia, unspecified: Secondary | ICD-10-CM | POA: Diagnosis not present

## 2016-02-16 DIAGNOSIS — J441 Chronic obstructive pulmonary disease with (acute) exacerbation: Secondary | ICD-10-CM | POA: Diagnosis not present

## 2016-02-16 DIAGNOSIS — Z9981 Dependence on supplemental oxygen: Secondary | ICD-10-CM | POA: Diagnosis not present

## 2016-02-16 DIAGNOSIS — G301 Alzheimer's disease with late onset: Secondary | ICD-10-CM | POA: Diagnosis not present

## 2016-02-16 MED ORDER — OXYCODONE HCL 5 MG PO TABS: 5.0000 mg | ORAL_TABLET | ORAL | 0 refills | Status: DC | PRN

## 2016-02-16 NOTE — Telephone Encounter (Signed)
Adam Singleton with Adam Singleton is requesting a refill on oxyCODONE (OXY IR/ROXICODONE) 5 MG immediate release tablet. Last Written: 01/19/16 Adam Singleton stated pt will run out of the medication tomorrow and would like it sent to [facility] and they will deliver it since pt is with [facility]osp[facility]. Please advise. Thanks TNP

## 2016-02-17 DIAGNOSIS — Z9981 Dependence on supplemental oxygen: Secondary | ICD-10-CM | POA: Diagnosis not present

## 2016-02-17 DIAGNOSIS — E46 Unspecified protein-calorie malnutrition: Secondary | ICD-10-CM | POA: Diagnosis not present

## 2016-02-17 DIAGNOSIS — R1310 Dysphagia, unspecified: Secondary | ICD-10-CM | POA: Diagnosis not present

## 2016-02-17 DIAGNOSIS — G301 Alzheimer's disease with late onset: Secondary | ICD-10-CM | POA: Diagnosis not present

## 2016-02-17 DIAGNOSIS — J441 Chronic obstructive pulmonary disease with (acute) exacerbation: Secondary | ICD-10-CM | POA: Diagnosis not present

## 2016-02-17 DIAGNOSIS — F0280 Dementia in other diseases classified elsewhere without behavioral disturbance: Secondary | ICD-10-CM | POA: Diagnosis not present

## 2016-02-18 DIAGNOSIS — G301 Alzheimer's disease with late onset: Secondary | ICD-10-CM | POA: Diagnosis not present

## 2016-02-18 DIAGNOSIS — E46 Unspecified protein-calorie malnutrition: Secondary | ICD-10-CM | POA: Diagnosis not present

## 2016-02-18 DIAGNOSIS — J441 Chronic obstructive pulmonary disease with (acute) exacerbation: Secondary | ICD-10-CM | POA: Diagnosis not present

## 2016-02-18 DIAGNOSIS — R1310 Dysphagia, unspecified: Secondary | ICD-10-CM | POA: Diagnosis not present

## 2016-02-18 DIAGNOSIS — Z9981 Dependence on supplemental oxygen: Secondary | ICD-10-CM | POA: Diagnosis not present

## 2016-02-18 DIAGNOSIS — F0280 Dementia in other diseases classified elsewhere without behavioral disturbance: Secondary | ICD-10-CM | POA: Diagnosis not present

## 2016-02-19 DIAGNOSIS — G301 Alzheimer's disease with late onset: Secondary | ICD-10-CM | POA: Diagnosis not present

## 2016-02-19 DIAGNOSIS — J441 Chronic obstructive pulmonary disease with (acute) exacerbation: Secondary | ICD-10-CM | POA: Diagnosis not present

## 2016-02-19 DIAGNOSIS — Z9981 Dependence on supplemental oxygen: Secondary | ICD-10-CM | POA: Diagnosis not present

## 2016-02-19 DIAGNOSIS — F0280 Dementia in other diseases classified elsewhere without behavioral disturbance: Secondary | ICD-10-CM | POA: Diagnosis not present

## 2016-02-19 DIAGNOSIS — R1310 Dysphagia, unspecified: Secondary | ICD-10-CM | POA: Diagnosis not present

## 2016-02-19 DIAGNOSIS — E46 Unspecified protein-calorie malnutrition: Secondary | ICD-10-CM | POA: Diagnosis not present

## 2016-02-22 DIAGNOSIS — J441 Chronic obstructive pulmonary disease with (acute) exacerbation: Secondary | ICD-10-CM | POA: Diagnosis not present

## 2016-02-22 DIAGNOSIS — Z9981 Dependence on supplemental oxygen: Secondary | ICD-10-CM | POA: Diagnosis not present

## 2016-02-22 DIAGNOSIS — F0280 Dementia in other diseases classified elsewhere without behavioral disturbance: Secondary | ICD-10-CM | POA: Diagnosis not present

## 2016-02-22 DIAGNOSIS — G301 Alzheimer's disease with late onset: Secondary | ICD-10-CM | POA: Diagnosis not present

## 2016-02-22 DIAGNOSIS — R1310 Dysphagia, unspecified: Secondary | ICD-10-CM | POA: Diagnosis not present

## 2016-02-22 DIAGNOSIS — E46 Unspecified protein-calorie malnutrition: Secondary | ICD-10-CM | POA: Diagnosis not present

## 2016-02-23 DIAGNOSIS — F0280 Dementia in other diseases classified elsewhere without behavioral disturbance: Secondary | ICD-10-CM | POA: Diagnosis not present

## 2016-02-23 DIAGNOSIS — Z9981 Dependence on supplemental oxygen: Secondary | ICD-10-CM | POA: Diagnosis not present

## 2016-02-23 DIAGNOSIS — E46 Unspecified protein-calorie malnutrition: Secondary | ICD-10-CM | POA: Diagnosis not present

## 2016-02-23 DIAGNOSIS — G301 Alzheimer's disease with late onset: Secondary | ICD-10-CM | POA: Diagnosis not present

## 2016-02-23 DIAGNOSIS — J441 Chronic obstructive pulmonary disease with (acute) exacerbation: Secondary | ICD-10-CM | POA: Diagnosis not present

## 2016-02-23 DIAGNOSIS — R1310 Dysphagia, unspecified: Secondary | ICD-10-CM | POA: Diagnosis not present

## 2016-02-24 DIAGNOSIS — J441 Chronic obstructive pulmonary disease with (acute) exacerbation: Secondary | ICD-10-CM | POA: Diagnosis not present

## 2016-02-24 DIAGNOSIS — Z9981 Dependence on supplemental oxygen: Secondary | ICD-10-CM | POA: Diagnosis not present

## 2016-02-24 DIAGNOSIS — R1310 Dysphagia, unspecified: Secondary | ICD-10-CM | POA: Diagnosis not present

## 2016-02-24 DIAGNOSIS — G301 Alzheimer's disease with late onset: Secondary | ICD-10-CM | POA: Diagnosis not present

## 2016-02-24 DIAGNOSIS — E46 Unspecified protein-calorie malnutrition: Secondary | ICD-10-CM | POA: Diagnosis not present

## 2016-02-24 DIAGNOSIS — F0280 Dementia in other diseases classified elsewhere without behavioral disturbance: Secondary | ICD-10-CM | POA: Diagnosis not present

## 2016-02-25 DIAGNOSIS — Z9981 Dependence on supplemental oxygen: Secondary | ICD-10-CM | POA: Diagnosis not present

## 2016-02-25 DIAGNOSIS — J441 Chronic obstructive pulmonary disease with (acute) exacerbation: Secondary | ICD-10-CM | POA: Diagnosis not present

## 2016-02-25 DIAGNOSIS — R1310 Dysphagia, unspecified: Secondary | ICD-10-CM | POA: Diagnosis not present

## 2016-02-25 DIAGNOSIS — F0280 Dementia in other diseases classified elsewhere without behavioral disturbance: Secondary | ICD-10-CM | POA: Diagnosis not present

## 2016-02-25 DIAGNOSIS — E46 Unspecified protein-calorie malnutrition: Secondary | ICD-10-CM | POA: Diagnosis not present

## 2016-02-25 DIAGNOSIS — G301 Alzheimer's disease with late onset: Secondary | ICD-10-CM | POA: Diagnosis not present

## 2016-02-26 DIAGNOSIS — Z23 Encounter for immunization: Secondary | ICD-10-CM | POA: Diagnosis not present

## 2016-02-26 DIAGNOSIS — E46 Unspecified protein-calorie malnutrition: Secondary | ICD-10-CM | POA: Diagnosis not present

## 2016-02-26 DIAGNOSIS — R1310 Dysphagia, unspecified: Secondary | ICD-10-CM | POA: Diagnosis not present

## 2016-02-26 DIAGNOSIS — J441 Chronic obstructive pulmonary disease with (acute) exacerbation: Secondary | ICD-10-CM | POA: Diagnosis not present

## 2016-02-26 DIAGNOSIS — Z9981 Dependence on supplemental oxygen: Secondary | ICD-10-CM | POA: Diagnosis not present

## 2016-02-26 DIAGNOSIS — F0280 Dementia in other diseases classified elsewhere without behavioral disturbance: Secondary | ICD-10-CM | POA: Diagnosis not present

## 2016-02-26 DIAGNOSIS — G301 Alzheimer's disease with late onset: Secondary | ICD-10-CM | POA: Diagnosis not present

## 2016-02-29 DIAGNOSIS — F0280 Dementia in other diseases classified elsewhere without behavioral disturbance: Secondary | ICD-10-CM | POA: Diagnosis not present

## 2016-02-29 DIAGNOSIS — J441 Chronic obstructive pulmonary disease with (acute) exacerbation: Secondary | ICD-10-CM | POA: Diagnosis not present

## 2016-02-29 DIAGNOSIS — E46 Unspecified protein-calorie malnutrition: Secondary | ICD-10-CM | POA: Diagnosis not present

## 2016-02-29 DIAGNOSIS — G301 Alzheimer's disease with late onset: Secondary | ICD-10-CM | POA: Diagnosis not present

## 2016-02-29 DIAGNOSIS — R1310 Dysphagia, unspecified: Secondary | ICD-10-CM | POA: Diagnosis not present

## 2016-02-29 DIAGNOSIS — Z9981 Dependence on supplemental oxygen: Secondary | ICD-10-CM | POA: Diagnosis not present

## 2016-03-01 DIAGNOSIS — F0280 Dementia in other diseases classified elsewhere without behavioral disturbance: Secondary | ICD-10-CM | POA: Diagnosis not present

## 2016-03-01 DIAGNOSIS — J441 Chronic obstructive pulmonary disease with (acute) exacerbation: Secondary | ICD-10-CM | POA: Diagnosis not present

## 2016-03-01 DIAGNOSIS — E46 Unspecified protein-calorie malnutrition: Secondary | ICD-10-CM | POA: Diagnosis not present

## 2016-03-01 DIAGNOSIS — R1310 Dysphagia, unspecified: Secondary | ICD-10-CM | POA: Diagnosis not present

## 2016-03-01 DIAGNOSIS — Z9981 Dependence on supplemental oxygen: Secondary | ICD-10-CM | POA: Diagnosis not present

## 2016-03-01 DIAGNOSIS — G301 Alzheimer's disease with late onset: Secondary | ICD-10-CM | POA: Diagnosis not present

## 2016-03-02 DIAGNOSIS — Z9981 Dependence on supplemental oxygen: Secondary | ICD-10-CM | POA: Diagnosis not present

## 2016-03-02 DIAGNOSIS — E46 Unspecified protein-calorie malnutrition: Secondary | ICD-10-CM | POA: Diagnosis not present

## 2016-03-02 DIAGNOSIS — R1310 Dysphagia, unspecified: Secondary | ICD-10-CM | POA: Diagnosis not present

## 2016-03-02 DIAGNOSIS — G301 Alzheimer's disease with late onset: Secondary | ICD-10-CM | POA: Diagnosis not present

## 2016-03-02 DIAGNOSIS — F0280 Dementia in other diseases classified elsewhere without behavioral disturbance: Secondary | ICD-10-CM | POA: Diagnosis not present

## 2016-03-02 DIAGNOSIS — J441 Chronic obstructive pulmonary disease with (acute) exacerbation: Secondary | ICD-10-CM | POA: Diagnosis not present

## 2016-03-03 DIAGNOSIS — E46 Unspecified protein-calorie malnutrition: Secondary | ICD-10-CM | POA: Diagnosis not present

## 2016-03-03 DIAGNOSIS — R1310 Dysphagia, unspecified: Secondary | ICD-10-CM | POA: Diagnosis not present

## 2016-03-03 DIAGNOSIS — F0280 Dementia in other diseases classified elsewhere without behavioral disturbance: Secondary | ICD-10-CM | POA: Diagnosis not present

## 2016-03-03 DIAGNOSIS — Z9981 Dependence on supplemental oxygen: Secondary | ICD-10-CM | POA: Diagnosis not present

## 2016-03-03 DIAGNOSIS — J441 Chronic obstructive pulmonary disease with (acute) exacerbation: Secondary | ICD-10-CM | POA: Diagnosis not present

## 2016-03-03 DIAGNOSIS — G301 Alzheimer's disease with late onset: Secondary | ICD-10-CM | POA: Diagnosis not present

## 2016-03-04 DIAGNOSIS — J441 Chronic obstructive pulmonary disease with (acute) exacerbation: Secondary | ICD-10-CM | POA: Diagnosis not present

## 2016-03-04 DIAGNOSIS — F0280 Dementia in other diseases classified elsewhere without behavioral disturbance: Secondary | ICD-10-CM | POA: Diagnosis not present

## 2016-03-04 DIAGNOSIS — G301 Alzheimer's disease with late onset: Secondary | ICD-10-CM | POA: Diagnosis not present

## 2016-03-04 DIAGNOSIS — E46 Unspecified protein-calorie malnutrition: Secondary | ICD-10-CM | POA: Diagnosis not present

## 2016-03-04 DIAGNOSIS — R1310 Dysphagia, unspecified: Secondary | ICD-10-CM | POA: Diagnosis not present

## 2016-03-04 DIAGNOSIS — Z9981 Dependence on supplemental oxygen: Secondary | ICD-10-CM | POA: Diagnosis not present

## 2016-03-07 ENCOUNTER — Telehealth: Payer: Self-pay | Admitting: Family Medicine

## 2016-03-07 DIAGNOSIS — J441 Chronic obstructive pulmonary disease with (acute) exacerbation: Secondary | ICD-10-CM | POA: Diagnosis not present

## 2016-03-07 DIAGNOSIS — R1310 Dysphagia, unspecified: Secondary | ICD-10-CM | POA: Diagnosis not present

## 2016-03-07 DIAGNOSIS — G301 Alzheimer's disease with late onset: Secondary | ICD-10-CM | POA: Diagnosis not present

## 2016-03-07 DIAGNOSIS — E46 Unspecified protein-calorie malnutrition: Secondary | ICD-10-CM | POA: Diagnosis not present

## 2016-03-07 DIAGNOSIS — F0280 Dementia in other diseases classified elsewhere without behavioral disturbance: Secondary | ICD-10-CM | POA: Diagnosis not present

## 2016-03-07 DIAGNOSIS — Z9981 Dependence on supplemental oxygen: Secondary | ICD-10-CM | POA: Diagnosis not present

## 2016-03-07 NOTE — Telephone Encounter (Signed)
Pt son Ulice Dash called states Hospice has been providing pt with oxygen and other breathing equipment for pt.  Pt has been discharged from hospice due to being more stable.  Pt son states hospice will leave the oxygen and equipment in the home until 03/28/16.  Pt son is stating pt will need this equipment reinstalled.  Hospice has faxed this information.  Pt son states Silva Bandy was pt nurse at [facility] [phone].  Son's CB#[phone]/MW

## 2016-03-07 NOTE — Telephone Encounter (Signed)
Please review-aa 

## 2016-03-08 DIAGNOSIS — F0280 Dementia in other diseases classified elsewhere without behavioral disturbance: Secondary | ICD-10-CM | POA: Diagnosis not present

## 2016-03-08 DIAGNOSIS — Z9981 Dependence on supplemental oxygen: Secondary | ICD-10-CM | POA: Diagnosis not present

## 2016-03-08 DIAGNOSIS — G301 Alzheimer's disease with late onset: Secondary | ICD-10-CM | POA: Diagnosis not present

## 2016-03-08 DIAGNOSIS — R1310 Dysphagia, unspecified: Secondary | ICD-10-CM | POA: Diagnosis not present

## 2016-03-08 DIAGNOSIS — E46 Unspecified protein-calorie malnutrition: Secondary | ICD-10-CM | POA: Diagnosis not present

## 2016-03-08 DIAGNOSIS — J441 Chronic obstructive pulmonary disease with (acute) exacerbation: Secondary | ICD-10-CM | POA: Diagnosis not present

## 2016-03-10 NOTE — Telephone Encounter (Signed)
Ulice Dash his son called back on the status of his father's care and oxygen replacement.  Please call [phone]  Thank sTeri

## 2016-03-10 NOTE — Telephone Encounter (Signed)
I think he is just requesting order for oxygen. He's not been seen for nearly a year. He will need appointment for [place] to document oximetry in order to qualify for home  Oxygen.

## 2016-03-11 NOTE — Telephone Encounter (Signed)
Returned call to [employer]. Scheduled ov on 03/18/2016. Ulice Dash stated that as of 03/09/16 Hospice is no longer caring for patient. Hospice will be removing their equipment from pt's home 03/28/2016. Ulice Dash said that while he's here for oximetry, he will need to discuss ordering pt a wheelchair, a bed, and a nebulizer.

## 2016-03-15 ENCOUNTER — Other Ambulatory Visit: Payer: Self-pay | Admitting: *Deleted

## 2016-03-15 MED ORDER — OXYCODONE HCL 5 MG PO TABS: 5.0000 mg | ORAL_TABLET | ORAL | 0 refills | Status: DC | PRN

## 2016-03-18 ENCOUNTER — Ambulatory Visit (INDEPENDENT_AMBULATORY_CARE_PROVIDER_SITE_OTHER): Payer: 59 | Admitting: Family Medicine

## 2016-03-18 ENCOUNTER — Encounter: Payer: Self-pay | Admitting: Family Medicine

## 2016-03-18 VITALS — BP 124/58 | HR 67 | Temp 98.6°F | Resp 16

## 2016-03-18 DIAGNOSIS — J449 Chronic obstructive pulmonary disease, unspecified: Secondary | ICD-10-CM

## 2016-03-18 DIAGNOSIS — G309 Alzheimer's disease, unspecified: Secondary | ICD-10-CM | POA: Diagnosis not present

## 2016-03-18 DIAGNOSIS — F0280 Dementia in other diseases classified elsewhere without behavioral disturbance: Secondary | ICD-10-CM | POA: Diagnosis not present

## 2016-03-18 NOTE — Progress Notes (Signed)
Patient: Adam Singleton Male    DOB: [birth year]   82 y.o.   MRN: [id number] Visit Date: 03/18/2016  Today's Provider: Lelon Huh, MD   Chief Complaint  Patient presents with  . Follow-up  . COPD   Subjective:    Patient is here for oximetry. He will also need to discuss ordering a wheelchair, a bed, and a nebulizer.   He has been under hospice care for several months for COPD and advanced dementia. He has had generalized weakness and is unable to get in and out of regular bed. He now requires hospital bed due to impaired bed to chair transfers. He has also been requiring continuous oxygen.   Alzheimer's dementia   Chronic obstructive pulmonary disease, unspecified COPD type ([place])       Allergies  Allergen Reactions  . Vancomycin Hcl Rash     Current Outpatient Prescriptions:  .  acetaminophen (TYLENOL) 325 MG tablet, Take 2 tablets by mouth every 4 (four) hours as needed. for mild pain or fever, Disp: , Rfl:  .  aspirin 81 MG EC tablet, Take 1 tablet by mouth daily., Disp: , Rfl:  .  bisacodyl (FLEET) 10 MG/30ML ENEM, Place 10 mg rectally once., Disp: , Rfl:  .  budesonide-formoterol (SYMBICORT) 160-4.5 MCG/ACT inhaler, Inhale 2 puffs into the lungs 2 (two) times daily. For chronic obstructive pulmonary, Disp: , Rfl:  .  Dextromethorphan-Guaifenesin 10-100 MG/5ML liquid, Take 5 mLs by mouth every 4 (four) hours as needed. For cough, Disp: , Rfl:  .  donepezil (ARICEPT) 10 MG tablet, Take 1 tablet by mouth at bedtime., Disp: , Rfl:  .  Ipratropium-Albuterol (COMBIVENT) 20-100 MCG/ACT AERS respimat, Inhale into the lungs 4 (four) times daily., Disp: , Rfl:  .  lovastatin (MEVACOR) 20 MG tablet, Take 1 tablet by mouth daily., Disp: , Rfl:  .  memantine (NAMENDA) 10 MG tablet, Take 1 tablet by mouth 2 (two) times daily., Disp: , Rfl:  .  oxyCODONE (OXY IR/ROXICODONE) 5 MG immediate release tablet, Take 1 tablet (5 mg total) by mouth every 4 (four) hours as needed for  moderate pain (HOSPICE PATIENT)., Disp: 60 tablet, Rfl: 0 .  polyethylene glycol (MIRALAX / GLYCOLAX) packet, Take 17 g by mouth daily as needed., Disp: , Rfl:  .  sennosides-docusate sodium (SENOKOT-S) 8.6-50 MG tablet, Take 1 tablet by mouth daily., Disp: , Rfl:  .  Travoprost, BAK Free, (TRAVATAN) 0.004 % SOLN ophthalmic solution, Apply 1 drop to eye at bedtime., Disp: , Rfl:   Review of Systems  Constitutional: Negative for appetite change, chills and fever.  Respiratory: Negative for chest tightness, shortness of breath and wheezing.   Cardiovascular: Negative for chest pain and palpitations.  Gastrointestinal: Negative for abdominal pain, nausea and vomiting.    Social History  Substance Use Topics  . Smoking status: Former Smoker    Packs/day: 1.00    Years: 40.00    Types: Cigarettes    Quit date: 05/16/1988  . Smokeless tobacco: Not on file  . Alcohol use No   Objective:   BP (!) 124/58 (BP Location: Right Arm, Patient Position: Sitting, Cuff Size: Normal)   Pulse 67   Temp 98.6 F (37 C) (Oral)   Resp 16   SpO2 98%  (room air) SpO2=88% with exertion on room air SpO2=94% with exertion on 2lpm oxygen.   Physical Exam   General Appearance:    Alert, cooperative, no distress  Eyes:  PERRL, conjunctiva/corneas clear, EOM's intact       Lungs:     Clear to auscultation bilaterally, respirations unlabored  Heart:    Regular rate and rhythm  Neurologic:   Awake, alert, oriented x 3. No apparent focal neurological           defect.          Assessment & Plan:     1. Chronic obstructive pulmonary disease, unspecified COPD type (HCC) Stable Continue current inhalers.   2. Alzheimer's dementia without behavioral disturbance, unspecified timing of dementia onset Stable. Continue current medications and plan of care.         Lelon Huh, MD  [facility]

## 2016-03-22 NOTE — Telephone Encounter (Signed)
Please advise 

## 2016-03-22 NOTE — Telephone Encounter (Signed)
J. Mr. Sweezey son called back today saying [facility] needs an official order for the Oxygen.  It needs to be on our letterhead or Rx sheet.  He request that we fax it to them   Fax # [phone]   Attn: Lenna Sciara  Thanks [name]

## 2016-03-23 NOTE — Telephone Encounter (Signed)
FYI. KW 

## 2016-03-23 NOTE — Telephone Encounter (Signed)
Pt's son J. Called back with a different fax number to use when faxing the order for oxygen  Fax number is [place]  Thanks [name]

## 2016-03-25 ENCOUNTER — Telehealth: Payer: Self-pay | Admitting: Family Medicine

## 2016-03-25 ENCOUNTER — Encounter: Payer: Medicare Other | Admitting: Family Medicine

## 2016-03-25 NOTE — Telephone Encounter (Signed)
Mandy from [facility] called back wanting to add to the previous message. She states she needs documentaion of patients 02 saturation to qualify for oxygen therapy. She also needs the following orders placed in Epic:  1) order for oxygen (DME for home use only oxygen) 2)order for Nebulizer (DME nebulizer)  The patient was scheduled for an appointment today at 3pm. This appointment was just cancelled by caregiver.

## 2016-03-25 NOTE — Telephone Encounter (Signed)
Adam Singleton with [facility] needs a new 02 sat. Off oxygen on Adam Singleton when he comes in for his appt. Today and also a new order for 24 hour oxygen.   Her call back # is [phone]

## 2016-03-25 NOTE — Telephone Encounter (Signed)
Adam Singleton called back again requesting that the order for the nebulizer medications be hand written on a prescription pad and faxed to her at [phone]. She says that [facility] cannot accept orders for medication through Epic. She states the order for a  nebulizer and oxygen still needs to be put into Epic as stated below.

## 2016-03-28 ENCOUNTER — Telehealth: Payer: Self-pay

## 2016-03-28 DIAGNOSIS — J449 Chronic obstructive pulmonary disease, unspecified: Secondary | ICD-10-CM

## 2016-03-28 NOTE — Telephone Encounter (Signed)
Mandy from [facility] had called and states that she needs you to put in order in Epic of patients oxygen so that they can deliver it to patients home. Leafy Ro states that [facility] will be pulling medical equipment out that is why she needs order placed in Epic. KW

## 2016-03-31 ENCOUNTER — Other Ambulatory Visit: Payer: Self-pay | Admitting: *Deleted

## 2016-03-31 DIAGNOSIS — J449 Chronic obstructive pulmonary disease, unspecified: Secondary | ICD-10-CM

## 2016-03-31 DIAGNOSIS — J439 Emphysema, unspecified: Secondary | ICD-10-CM

## 2016-04-15 ENCOUNTER — Encounter: Payer: Self-pay | Admitting: Family Medicine

## 2016-04-15 ENCOUNTER — Ambulatory Visit (INDEPENDENT_AMBULATORY_CARE_PROVIDER_SITE_OTHER): Payer: Medicare Other | Admitting: Family Medicine

## 2016-04-15 VITALS — BP 130/80 | HR 72 | Temp 98.4°F | Resp 18

## 2016-04-15 DIAGNOSIS — E785 Hyperlipidemia, unspecified: Secondary | ICD-10-CM | POA: Diagnosis not present

## 2016-04-15 DIAGNOSIS — D696 Thrombocytopenia, unspecified: Secondary | ICD-10-CM | POA: Diagnosis not present

## 2016-04-15 DIAGNOSIS — Z0000 Encounter for general adult medical examination without abnormal findings: Secondary | ICD-10-CM

## 2016-04-15 DIAGNOSIS — R739 Hyperglycemia, unspecified: Secondary | ICD-10-CM

## 2016-04-15 DIAGNOSIS — Z86711 Personal history of pulmonary embolism: Secondary | ICD-10-CM | POA: Diagnosis not present

## 2016-04-15 DIAGNOSIS — E871 Hypo-osmolality and hyponatremia: Secondary | ICD-10-CM | POA: Diagnosis not present

## 2016-04-15 NOTE — Progress Notes (Signed)
Patient: Adam Singleton, Male    DOB: 1929-01-27, 82 y.o.   MRN: [id number] Visit Date: 04/15/2016  Today's Provider: Lelon Huh, MD   Chief Complaint  Patient presents with  . Medicare Wellness   Subjective:    Annual wellness visit Adam Singleton is a 82 y.o. male. He feels well. He reports exercising no. He reports he is sleeping fairly well.  -----------------------------------------------------------    Lipid/Cholesterol, Follow-up:   Last seen for this 11/05/2014  Management since that visit includes; no changes.  Last Lipid Panel:    Component Value Date/Time   CHOL 157 05/30/2014   TRIG 87 05/30/2014   HDL 48 05/30/2014   LDLCALC 92 05/30/2014    He reports good compliance with treatment. He is not having side effects. none  Wt Readings from Last 3 Encounters:  06/26/15 161 lb (73 kg)  07/08/14 167 lb (75.8 kg)    ------------------------------------------------------------------     Review of Systems  Constitutional: Negative for appetite change, chills and fever.  Respiratory: Negative for chest tightness, shortness of breath and wheezing.   Cardiovascular: Negative for chest pain and palpitations.  Gastrointestinal: Negative for abdominal pain, nausea and vomiting.    Social History   Social History  . Marital status: Widowed    Spouse name: N/A  . Number of children: N/A  . Years of education: N/A   Occupational History  . retired    Social History Main Topics  . Smoking status: Former Smoker    Packs/day: 1.00    Years: 40.00    Types: Cigarettes    Quit date: 05/16/1988  . Smokeless tobacco: Not on file  . Alcohol use No  . Drug use: No  . Sexual activity: Not on file   Other Topics Concern  . Not on file   Social History Narrative  . No narrative on file    Past Medical History:  Diagnosis Date  . Alzheimer's disease 12/09/2009  . Cancer ([place])    malignant melanoma  . Clotting disorder (HCC)    Thrombocytopenia  . Colon polyps   . COPD (chronic obstructive pulmonary disease) ([place])   . Hyperlipidemia   . Hypertension   . Tachycardia      Patient Active Problem List   Diagnosis Date Noted  . Pressure ulcer 06/27/2015  . Compression fracture of L2 ([place]) 06/26/2015  . Back pain 06/26/2015  . 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 Emphysema lung ([place]) 06/26/2015  . Abdominal aortic aneurysm (AAA) ([place]) 06/26/2015  . Hyperglycemia 11/05/2014  . History of DVT (deep vein thrombosis) 11/05/2014  . 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 Personal history of PE (pulmonary embolism) 11/05/2014  . Obstruction to urinary outflow 11/05/2014  . History of colonic polyps 09/24/2014  . COPD (chronic obstructive pulmonary disease) ([place]) 09/24/2014  . H/O cardiovascular disorder 09/24/2014  . H/O Malignant melanoma 09/24/2014  . Prostate disease 09/24/2014  . Thrombocytopenia ([place]) 09/24/2014  . Alzheimer's dementia 12/09/2009  . Hyperlipidemia 12/09/2009  . Malignant neoplasm of scalp and skin of neck 11/09/2009    Past Surgical History:  Procedure Laterality Date  . KYPHOPLASTY N/A 06/29/2015   Procedure: KYPHOPLASTY L2;  Surgeon: Hessie Knows, MD;  Location: [facility] ORS;  Service: Orthopedics;  Laterality: N/A;  . MOHS SURGERY  12/07/2009   Moh's micrographic surgery for skin cancer    His Family history is unknown by patient.     @MEDCURRENT @  Patient Care Team: Birdie Sons, MD as  PCP - General (Family Medicine)     Objective:   Vitals: BP 130/80 (BP Location: Left Arm, Patient Position: Sitting, Cuff Size: Normal)   Pulse 72   Temp 98.4 F (36.9 C) (Oral)   Resp 18   SpO2 97%   Physical Exam   General Appearance:    Alert, cooperative, no distress  Eyes:    PERRL, conjunctiva/corneas clear, EOM's intact       Lungs:     Clear to auscultation bilaterally, respirations unlabored. Diminished breath sounds.   Heart:    Regular rate and rhythm  Neurologic:   Awake, alert,  oriented x 1 No apparent focal neurological           defect.        Activities of Daily Living In your present state of health, do you have any difficulty performing the following activities: 04/15/2016 06/26/2015  Hearing? N N  Vision? N N  Difficulty concentrating or making decisions? Y N  Walking or climbing stairs? Y Y  Dressing or bathing? Y Y  Doing errands, shopping? Y N  Some recent data might be hidden    Fall Risk Assessment Fall Risk  04/15/2016 02/26/2015  Falls in the past year? No Yes  Number falls in past yr: - 1  Injury with Fall? - Yes  Follow up - Falls evaluation completed     Depression Screen PHQ 2/9 Scores 02/26/2015  PHQ - 2 Score 0   Cognitive Testing - 6-CIT  Patient unable to perform.  Correct? Score     Audit-C Alcohol Use Screening  Question Answer Points  How often do you have alcoholic drink? never 0  On days you do drink alcohol, how many drinks do you typically consume? 0 0  How oftey will you drink 6 or more in a total? never 0  Total Score:  0   A score of 3 or more in women, and 4 or more in men indicates increased risk for alcohol abuse, EXCEPT if all of the points are from question 1.         Assessment & Plan:     Annual Wellness Visit  Reviewed patient's Family Medical History Reviewed and updated list of patient's medical providers Assessment of cognitive impairment was done Assessed patient's functional ability Established a written schedule for health screening [place] Completed and Reviewed  Exercise Activities and Dietary recommendations Goals    None      Immunization History  Administered Date(s) Administered  . Influenza, High Dose Seasonal PF 02/26/2015  . Influenza-Unspecified 02/16/2016  . Pneumococcal Conjugate-13 01/15/2014  . Pneumococcal Polysaccharide-23 02/12/2013  . Tdap 04/06/2015    Health Maintenance  Topic Date Due  . ZOSTAVAX  07/03/1990  . TETANUS/TDAP   04/05/2025  . INFLUENZA VACCINE  Completed  . PNA vac Low Risk Adult  Completed     Discussed health benefits of physical activity, and encouraged him to engage in regular exercise appropriate for his age and condition.    ---------------------------------------------------------------------------- 1. Medicare annual wellness visit, subsequent  2. Thrombocytopenia (HCC)  - CBC  3. . Hyperglycemia  - Hemoglobin A1c  4. Hyponatremia  - Renal function panel    Lelon Huh, MD  [facility]

## 2016-04-16 LAB — RENAL FUNCTION PANEL
ALBUMIN: 3.6 g/dL (ref 3.5–4.7)
BUN/Creatinine Ratio: 22 (ref 10–24)
BUN: 17 mg/dL (ref 8–27)
CHLORIDE: 109 mmol/L — AB (ref 96–106)
CO2: 21 mmol/L (ref 18–29)
Calcium: 8.9 mg/dL (ref 8.6–10.2)
Creatinine, Ser: 0.78 mg/dL (ref 0.76–1.27)
GFR, EST AFRICAN AMERICAN: 95 mL/min/{1.73_m2} (ref >59)
GFR, EST NON AFRICAN AMERICAN: 82 mL/min/{1.73_m2} (ref >59)
GLUCOSE: 125 mg/dL — AB (ref 65–99)
PHOSPHORUS: 3 mg/dL (ref 2.5–4.5)
POTASSIUM: 4.2 mmol/L (ref 3.5–5.2)
SODIUM: 151 mmol/L — AB (ref 134–144)

## 2016-04-16 LAB — HEMOGLOBIN A1C
ESTIMATED AVERAGE GLUCOSE: 117 mg/dL
HEMOGLOBIN A1C: 5.7 % — AB (ref 4.8–5.6)

## 2016-04-16 LAB — CBC
HEMOGLOBIN: 13.1 g/dL (ref 12.6–17.7)
Hematocrit: 39.2 % (ref 37.5–51.0)
MCH: 30.6 pg (ref 26.6–33.0)
MCHC: 33.4 g/dL (ref 31.5–35.7)
MCV: 92 fL (ref 79–97)
PLATELETS: 174 10*3/uL (ref 150–379)
RBC: 4.28 x10E6/uL (ref 4.14–5.80)
RDW: 14.9 % (ref 12.3–15.4)
WBC: 7.9 10*3/uL (ref 3.4–10.8)

## 2016-04-24 DIAGNOSIS — G309 Alzheimer's disease, unspecified: Secondary | ICD-10-CM | POA: Diagnosis not present

## 2016-04-24 DIAGNOSIS — I10 Essential (primary) hypertension: Secondary | ICD-10-CM | POA: Diagnosis not present

## 2016-04-24 DIAGNOSIS — Z9981 Dependence on supplemental oxygen: Secondary | ICD-10-CM | POA: Diagnosis not present

## 2016-04-24 DIAGNOSIS — F0280 Dementia in other diseases classified elsewhere without behavioral disturbance: Secondary | ICD-10-CM | POA: Diagnosis not present

## 2016-04-24 DIAGNOSIS — R2689 Other abnormalities of gait and mobility: Secondary | ICD-10-CM | POA: Diagnosis not present

## 2016-04-24 DIAGNOSIS — J449 Chronic obstructive pulmonary disease, unspecified: Secondary | ICD-10-CM | POA: Diagnosis not present

## 2016-04-25 ENCOUNTER — Encounter: Payer: Self-pay | Admitting: Emergency Medicine

## 2016-04-25 ENCOUNTER — Emergency Department: Payer: Medicare Other

## 2016-04-25 ENCOUNTER — Inpatient Hospital Stay
Admission: EM | Admit: 2016-04-25 | Discharge: 2016-04-27 | DRG: 871 | Payer: Medicare Other | Attending: Internal Medicine | Admitting: Internal Medicine

## 2016-04-25 DIAGNOSIS — Z79899 Other long term (current) drug therapy: Secondary | ICD-10-CM | POA: Diagnosis not present

## 2016-04-25 DIAGNOSIS — Z888 Allergy status to other drugs, medicaments and biological substances status: Secondary | ICD-10-CM

## 2016-04-25 DIAGNOSIS — R262 Difficulty in walking, not elsewhere classified: Secondary | ICD-10-CM

## 2016-04-25 DIAGNOSIS — G309 Alzheimer's disease, unspecified: Secondary | ICD-10-CM | POA: Diagnosis present

## 2016-04-25 DIAGNOSIS — J440 Chronic obstructive pulmonary disease with acute lower respiratory infection: Secondary | ICD-10-CM | POA: Diagnosis present

## 2016-04-25 DIAGNOSIS — Z87891 Personal history of nicotine dependence: Secondary | ICD-10-CM | POA: Diagnosis not present

## 2016-04-25 DIAGNOSIS — E785 Hyperlipidemia, unspecified: Secondary | ICD-10-CM | POA: Diagnosis present

## 2016-04-25 DIAGNOSIS — Z79891 Long term (current) use of opiate analgesic: Secondary | ICD-10-CM | POA: Diagnosis not present

## 2016-04-25 DIAGNOSIS — I714 Abdominal aortic aneurysm, without rupture: Secondary | ICD-10-CM | POA: Diagnosis not present

## 2016-04-25 DIAGNOSIS — Y95 Nosocomial condition: Secondary | ICD-10-CM | POA: Diagnosis present

## 2016-04-25 DIAGNOSIS — A419 Sepsis, unspecified organism: Secondary | ICD-10-CM | POA: Diagnosis present

## 2016-04-25 DIAGNOSIS — I10 Essential (primary) hypertension: Secondary | ICD-10-CM | POA: Diagnosis present

## 2016-04-25 DIAGNOSIS — J181 Lobar pneumonia, unspecified organism: Secondary | ICD-10-CM

## 2016-04-25 DIAGNOSIS — Z881 Allergy status to other antibiotic agents status: Secondary | ICD-10-CM | POA: Diagnosis not present

## 2016-04-25 DIAGNOSIS — Z7982 Long term (current) use of aspirin: Secondary | ICD-10-CM

## 2016-04-25 DIAGNOSIS — Z66 Do not resuscitate: Secondary | ICD-10-CM | POA: Diagnosis present

## 2016-04-25 DIAGNOSIS — F0280 Dementia in other diseases classified elsewhere without behavioral disturbance: Secondary | ICD-10-CM | POA: Diagnosis present

## 2016-04-25 DIAGNOSIS — M6281 Muscle weakness (generalized): Secondary | ICD-10-CM

## 2016-04-25 DIAGNOSIS — F0390 Unspecified dementia without behavioral disturbance: Secondary | ICD-10-CM | POA: Diagnosis not present

## 2016-04-25 DIAGNOSIS — R4182 Altered mental status, unspecified: Secondary | ICD-10-CM | POA: Diagnosis not present

## 2016-04-25 DIAGNOSIS — J189 Pneumonia, unspecified organism: Secondary | ICD-10-CM | POA: Diagnosis present

## 2016-04-25 DIAGNOSIS — R0600 Dyspnea, unspecified: Secondary | ICD-10-CM | POA: Diagnosis not present

## 2016-04-25 DIAGNOSIS — I959 Hypotension, unspecified: Secondary | ICD-10-CM | POA: Diagnosis not present

## 2016-04-25 DIAGNOSIS — R000 Tachycardia, unspecified: Secondary | ICD-10-CM | POA: Diagnosis present

## 2016-04-25 LAB — URINALYSIS, COMPLETE (UACMP) WITH MICROSCOPIC
Bilirubin Urine: NEGATIVE
GLUCOSE, UA: NEGATIVE mg/dL
KETONES UR: NEGATIVE mg/dL
Leukocytes, UA: NEGATIVE
NITRITE: NEGATIVE
PH: 5 (ref 5.0–8.0)
Protein, ur: NEGATIVE mg/dL
Specific Gravity, Urine: 1.014 (ref 1.005–1.030)

## 2016-04-25 LAB — COMPREHENSIVE METABOLIC PANEL
ALBUMIN: 3.4 g/dL — AB (ref 3.5–5.0)
ALK PHOS: 123 U/L (ref 38–126)
ALT: 14 U/L — ABNORMAL LOW (ref 17–63)
ANION GAP: 9 (ref 5–15)
AST: 24 U/L (ref 15–41)
BILIRUBIN TOTAL: 0.8 mg/dL (ref 0.3–1.2)
BUN: 16 mg/dL (ref 6–20)
CALCIUM: 8.5 mg/dL — AB (ref 8.9–10.3)
CO2: 25 mmol/L (ref 22–32)
Chloride: 109 mmol/L (ref 101–111)
Creatinine, Ser: 0.89 mg/dL (ref 0.61–1.24)
GLUCOSE: 115 mg/dL — AB (ref 65–99)
POTASSIUM: 3.6 mmol/L (ref 3.5–5.1)
Sodium: 143 mmol/L (ref 135–145)
TOTAL PROTEIN: 7.2 g/dL (ref 6.5–8.1)

## 2016-04-25 LAB — LACTIC ACID, PLASMA
LACTIC ACID, VENOUS: 2.1 mmol/L — AB (ref 0.5–1.9)
LACTIC ACID, VENOUS: 1.7 mmol/L (ref 0.5–1.9)

## 2016-04-25 LAB — PROTIME-INR
INR: 1.03
Prothrombin Time: 13.5 seconds (ref 11.4–15.2)

## 2016-04-25 LAB — CBC WITH DIFFERENTIAL/PLATELET
BASOS ABS: 0.1 10*3/uL (ref 0–0.1)
BASOS PCT: 0 %
EOS ABS: 0.1 10*3/uL (ref 0–0.7)
EOS PCT: 1 %
HCT: 42.3 % (ref 40.0–52.0)
HEMOGLOBIN: 13.7 g/dL (ref 13.0–18.0)
LYMPHS ABS: 0.8 10*3/uL — AB (ref 1.0–3.6)
Lymphocytes Relative: 5 %
MCH: 30 pg (ref 26.0–34.0)
MCHC: 32.3 g/dL (ref 32.0–36.0)
MCV: 93.1 fL (ref 80.0–100.0)
Monocytes Absolute: 0.6 10*3/uL (ref 0.2–1.0)
Monocytes Relative: 4 %
NEUTROS PCT: 90 %
Neutro Abs: 14.8 10*3/uL — ABNORMAL HIGH (ref 1.4–6.5)
PLATELETS: 172 10*3/uL (ref 150–440)
RBC: 4.54 MIL/uL (ref 4.40–5.90)
RDW: 15.2 % — ABNORMAL HIGH (ref 11.5–14.5)
WBC: 16.3 10*3/uL — AB (ref 3.8–10.6)

## 2016-04-25 LAB — TROPONIN I: Troponin I: <0.03 ng/mL (ref <0.03)

## 2016-04-25 MED ORDER — ENOXAPARIN SODIUM 40 MG/0.4ML ~~LOC~~ SOLN
40.0000 mg | SUBCUTANEOUS | Status: DC
  Administered 2016-04-26: 40 mg via SUBCUTANEOUS
  Filled 2016-04-25: qty 0.4

## 2016-04-25 MED ORDER — MEMANTINE HCL 5 MG PO TABS
10.0000 mg | ORAL_TABLET | Freq: Two times a day (BID) | ORAL | Status: DC
  Administered 2016-04-26 – 2016-04-27 (×3): 10 mg via ORAL
  Filled 2016-04-25 (×3): qty 2

## 2016-04-25 MED ORDER — CEFTRIAXONE SODIUM 1 G IJ SOLR: 1.0000 g | INTRAMUSCULAR | Status: DC

## 2016-04-25 MED ORDER — IPRATROPIUM-ALBUTEROL 0.5-2.5 (3) MG/3ML IN SOLN
3.0000 mL | Freq: Two times a day (BID) | RESPIRATORY_TRACT | Status: DC
  Filled 2016-04-25: qty 3

## 2016-04-25 MED ORDER — DEXTROSE 5 % IV SOLN: 500.0000 mg | INTRAVENOUS | Status: DC

## 2016-04-25 MED ORDER — OXYCODONE HCL 5 MG PO TABS
5.0000 mg | ORAL_TABLET | Freq: Two times a day (BID) | ORAL | Status: DC
  Administered 2016-04-26 – 2016-04-27 (×3): 5 mg via ORAL
  Filled 2016-04-25 (×3): qty 1

## 2016-04-25 MED ORDER — SODIUM CHLORIDE 0.9 % IV BOLUS (SEPSIS)
1000.0000 mL | Freq: Once | INTRAVENOUS | Status: AC
  Administered 2016-04-25: 1000 mL via INTRAVENOUS

## 2016-04-25 MED ORDER — ALBUTEROL SULFATE (2.5 MG/3ML) 0.083% IN NEBU: 2.5000 mg | INHALATION_SOLUTION | Freq: Four times a day (QID) | RESPIRATORY_TRACT | Status: DC | PRN

## 2016-04-25 MED ORDER — DEXTROSE 5 % IV SOLN: 1.0000 g | INTRAVENOUS | Status: DC

## 2016-04-25 MED ORDER — POLYETHYLENE GLYCOL 3350 17 G PO PACK
17.0000 g | PACK | ORAL | Status: DC
  Administered 2016-04-26: 17 g via ORAL
  Filled 2016-04-25: qty 1

## 2016-04-25 MED ORDER — DONEPEZIL HCL 5 MG PO TABS
10.0000 mg | ORAL_TABLET | Freq: Every day | ORAL | Status: DC
  Administered 2016-04-26: 10 mg via ORAL
  Filled 2016-04-25: qty 2

## 2016-04-25 MED ORDER — SODIUM CHLORIDE 0.9 % IV SOLN
INTRAVENOUS | Status: DC
  Administered 2016-04-26 (×2): via INTRAVENOUS

## 2016-04-25 MED ORDER — ACETAMINOPHEN 650 MG RE SUPP
650.0000 mg | Freq: Once | RECTAL | Status: AC
  Administered 2016-04-25: 650 mg via RECTAL
  Filled 2016-04-25: qty 1

## 2016-04-25 MED ORDER — GUAIFENESIN-DM 100-10 MG/5ML PO SYRP: 5.0000 mL | ORAL_SOLUTION | ORAL | Status: DC | PRN

## 2016-04-25 MED ORDER — SODIUM CHLORIDE 0.9 % IV BOLUS (SEPSIS)
500.0000 mL | Freq: Once | INTRAVENOUS | Status: AC
  Administered 2016-04-25: 500 mL via INTRAVENOUS

## 2016-04-25 MED ORDER — TRAMADOL HCL 50 MG PO TABS: 50.0000 mg | ORAL_TABLET | Freq: Four times a day (QID) | ORAL | Status: DC | PRN

## 2016-04-25 MED ORDER — ACETAMINOPHEN 325 MG PO TABS: 650.0000 mg | ORAL_TABLET | Freq: Four times a day (QID) | ORAL | Status: DC | PRN

## 2016-04-25 MED ORDER — BISACODYL 10 MG RE SUPP
10.0000 mg | Freq: Once | RECTAL | Status: AC
  Administered 2016-04-26: 10 mg via RECTAL
  Filled 2016-04-25: qty 1

## 2016-04-25 MED ORDER — ONDANSETRON HCL 4 MG/2ML IJ SOLN: 4.0000 mg | Freq: Four times a day (QID) | INTRAMUSCULAR | Status: DC | PRN

## 2016-04-25 MED ORDER — LEVOFLOXACIN IN D5W 500 MG/100ML IV SOLN: 500.0000 mg | INTRAVENOUS | Status: DC

## 2016-04-25 MED ORDER — LEVOFLOXACIN IN D5W 750 MG/150ML IV SOLN
750.0000 mg | Freq: Once | INTRAVENOUS | Status: AC
  Administered 2016-04-25: 750 mg via INTRAVENOUS
  Filled 2016-04-25: qty 150

## 2016-04-25 MED ORDER — PIPERACILLIN-TAZOBACTAM 3.375 G IVPB
3.3750 g | Freq: Three times a day (TID) | INTRAVENOUS | Status: DC
  Administered 2016-04-26 (×2): 3.375 g via INTRAVENOUS
  Filled 2016-04-25 (×2): qty 50

## 2016-04-25 MED ORDER — CEFTRIAXONE SODIUM-DEXTROSE 1-3.74 GM-% IV SOLR
1.0000 g | INTRAVENOUS | Status: DC
  Filled 2016-04-25: qty 50

## 2016-04-25 MED ORDER — ONDANSETRON HCL 4 MG PO TABS: 4.0000 mg | ORAL_TABLET | Freq: Four times a day (QID) | ORAL | Status: DC | PRN

## 2016-04-25 MED ORDER — ACETAMINOPHEN 650 MG RE SUPP: 650.0000 mg | Freq: Four times a day (QID) | RECTAL | Status: DC | PRN

## 2016-04-25 MED ORDER — ASPIRIN 81 MG PO CHEW
81.0000 mg | CHEWABLE_TABLET | Freq: Every day | ORAL | Status: DC
  Administered 2016-04-26 – 2016-04-27 (×2): 81 mg via ORAL
  Filled 2016-04-25 (×2): qty 1

## 2016-04-25 MED ORDER — SODIUM CHLORIDE 0.9 % IV BOLUS (SEPSIS)
250.0000 mL | Freq: Once | INTRAVENOUS | Status: AC
  Administered 2016-04-25: 250 mL via INTRAVENOUS

## 2016-04-25 MED ORDER — PIPERACILLIN-TAZOBACTAM 3.375 G IVPB 30 MIN
3.3750 g | Freq: Once | INTRAVENOUS | Status: AC
  Administered 2016-04-25: 3.375 g via INTRAVENOUS
  Filled 2016-04-25: qty 50

## 2016-04-25 MED ORDER — PRAVASTATIN SODIUM 20 MG PO TABS
20.0000 mg | ORAL_TABLET | Freq: Every day | ORAL | Status: DC
  Administered 2016-04-26: 20 mg via ORAL
  Filled 2016-04-25: qty 1

## 2016-04-25 MED ORDER — BISACODYL 10 MG/30ML RE ENEM: 10.0000 mg | ENEMA | Freq: Once | RECTAL | Status: DC

## 2016-04-25 MED ORDER — LATANOPROST 0.005 % OP SOLN
1.0000 [drp] | Freq: Every day | OPHTHALMIC | Status: DC
  Administered 2016-04-26 (×2): 1 [drp] via OPHTHALMIC
  Filled 2016-04-25: qty 2.5

## 2016-04-25 NOTE — ED Notes (Signed)
Dr. Mariea Clonts made aware of lactic 2.1.

## 2016-04-25 NOTE — ED Notes (Signed)
Pt's family left, would like passcode to be June for floor, this RN to pass on to floor RN.  Daughter Opal Sidles can be reached at [phone] and Farris Has can be reached at [phone]

## 2016-04-25 NOTE — ED Notes (Signed)
Code  Sepsis  Called to  [place]

## 2016-04-25 NOTE — ED Provider Notes (Signed)
[facility] Emergency Department Provider Note  ____________________________________________  Time seen: Approximately 6:31 PM  I have reviewed the triage vital signs and the nursing notes.   HISTORY  Chief Complaint Respiratory Distress  The patient's history is complicated by his dementia and altered mental status, but I have received information from the patient's daughter and grandson who are here and see the patient regularly.  HPI Adam Singleton is a 82 y.o. male with advanced dementia coming from [facility] for difficulty breathing and altered mental status. At baseline, the patient is not oriented, but he does answer basic questions and interactive. The patient's daughter says that today, he won't even make eye contact and is only grunting. EMS noted an axillary temperature of 100.4.   Past Medical History:  Diagnosis Date  . Alzheimer's disease 12/09/2009  . Cancer ([place])    malignant melanoma  . Clotting disorder (HCC)    Thrombocytopenia  . Colon polyps   . COPD (chronic obstructive pulmonary disease) ([place])   . Hyperlipidemia   . Hypertension   . Tachycardia     Patient Active Problem List   Diagnosis Date Noted  . Pressure ulcer 06/27/2015  . Compression fracture of L2 ([place]) 06/26/2015  . Back pain 06/26/2015  . 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 Emphysema lung ([place]) 06/26/2015  . Abdominal aortic aneurysm (AAA) ([place]) 06/26/2015  . Hyperglycemia 11/05/2014  . History of DVT (deep vein thrombosis) 11/05/2014  . 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 Personal history of PE (pulmonary embolism) 11/05/2014  . Obstruction to urinary outflow 11/05/2014  . History of colonic polyps 09/24/2014  . COPD (chronic obstructive pulmonary disease) ([place]) 09/24/2014  . H/O cardiovascular disorder 09/24/2014  . H/O Malignant melanoma 09/24/2014  . Prostate disease 09/24/2014  . Thrombocytopenia ([place]) 09/24/2014  . Alzheimer's dementia 12/09/2009  .  Hyperlipidemia 12/09/2009  . Malignant neoplasm of scalp and skin of neck 11/09/2009    Past Surgical History:  Procedure Laterality Date  . KYPHOPLASTY N/A 06/29/2015   Procedure: KYPHOPLASTY L2;  Surgeon: Adam Knows, MD;  Location: [facility] ORS;  Service: Orthopedics;  Laterality: N/A;  . MOHS SURGERY  12/07/2009   Moh's micrographic surgery for skin cancer    Current Outpatient Rx  . Order #: [id number] Class: Historical Med  . Order #: [id number] Class: Historical Med  . Order #: [id number] Class: Historical Med  . Order #: [id number] Class: Historical Med  . Order #: [id number] Class: Historical Med  . Order #: [id number] Class: Historical Med  . Order #: [id number] Class: Historical Med  . Order #: [id number] Class: Historical Med  . Order #: [id number] Class: Print  . Order #: [id number] Class: Historical Med  . Order #: [id number] Class: Historical Med  . Order #: [id number] Class: Historical Med    Allergies Vancomycin hcl  Family History  Problem Relation Age of Onset  . Family history unknown: Yes    Social History Social History  Substance Use Topics  . Smoking status: Former Smoker    Packs/day: 1.00    Years: 40.00    Types: Cigarettes    Quit date: 05/16/1988  . Smokeless tobacco: Not on file  . Alcohol use No    Review of Systems  Unable to obtain due to patient dementia and altered mental status, but the report is that he has altered mental status, possible difficulty breathing  ____________________________________________   PHYSICAL EXAM:  VITAL SIGNS: ED Triage Vitals  Enc Vitals Group     BP 04/25/16 1746 (!) 136/112     Pulse Rate  04/25/16 1746 (!) 51     Resp 04/25/16 1746 (!) 22     Temp 04/25/16 1746 99.3 F (37.4 C)     Temp Source 04/25/16 1746 Oral     SpO2 04/25/16 1746 95 %     Weight 04/25/16 1750 150 lb (68 kg)     Height 04/25/16 1750 5\' 7"  (1.702 m)     Head Circumference --      Peak Flow --      Pain Score --      Pain Loc --      Pain  Edu? --      Excl. in [place]? --     Constitutional: Patient is alert but not beaking. He has a rhythmic upward gaze and does not track anything in the room. He is not making any purposeful movements, and is not answering questions. He is protecting his airway at this time. Eyes: Conjunctivae are normal.  EOMI. No scleral icterus. Positive crusting in the eyes. Head: Atraumatic. No raccoon eyes or Battle sign. Nose: No congestion/rhinnorhea. Mouth/Throat: Mucous membranes are dry.  Neck: No stridor.  Supple.  No JVD. Cardiovascular: Fast rate, regular rhythm. No murmurs, rubs or gallops.  Respiratory: Tachypnea with normal oxygen saturation.  No accessory muscle use or retractions. Lungs CTAB.  No wheezes, rales or ronchi. Gastrointestinal: Soft, mildly distended. The patient grimaces when I push on his abdomen, regardless of the quadrant..  No guarding or rebound.  No peritoneal signs. Musculoskeletal: No LE edema. Abrasion to the right knee. Neurologic:  Alert and protecting airway but not answering questions.   Skin:  Skin is warm, dry and intact. No rash noted. Psychiatric: Mood and affect are normal. Speech and behavior are normal.  Normal judgement  ____________________________________________   LABS (all labs ordered are listed, but only abnormal results are displayed)  Labs Reviewed  COMPREHENSIVE METABOLIC PANEL - Abnormal; Notable for the following:       Result Value   Glucose, Bld 115 (*)    Calcium 8.5 (*)    Albumin 3.4 (*)    ALT 14 (*)    All other components within normal limits  LACTIC ACID, PLASMA - Abnormal; Notable for the following:    Lactic Acid, Venous 2.1 (*)    All other components within normal limits  CBC WITH DIFFERENTIAL/PLATELET - Abnormal; Notable for the following:    WBC 16.3 (*)    RDW 15.2 (*)    Neutro Abs 14.8 (*)    Lymphs Abs 0.8 (*)    All other components within normal limits  URINALYSIS, COMPLETE (UACMP) WITH MICROSCOPIC - Abnormal;  Notable for the following:    Color, Urine YELLOW (*)    APPearance CLEAR (*)    Hgb urine dipstick SMALL (*)    Bacteria, UA RARE (*)    Squamous Epithelial / LPF 0-5 (*)    All other components within normal limits  CULTURE, BLOOD (ROUTINE X 2)  CULTURE, BLOOD (ROUTINE X 2)  URINE CULTURE  LACTIC ACID, PLASMA  TROPONIN I  PROTIME-INR   ____________________________________________  EKG  ED ECG REPORT I, Eula Listen, the attending physician, personally viewed and interpreted this ECG.   Date: 04/25/2016  EKG Time: 1754  Rate: 127  Rhythm: sinus tachycardia  Axis: leftward  Intervals:Prolonged QTc  ST&T Change: No STEMI  ____________________________________________  RADIOLOGY  Dg Chest 2 View  Result Date: 04/25/2016 CLINICAL DATA:  Altered mental status and possible sepsis EXAM: CHEST  2 VIEW  COMPARISON:  12/23/2013 FINDINGS: Cardiac shadow is within normal limits. Aortic calcifications are again seen and stable. The lungs are well-aerated with mild increased opacity in the posterior costophrenic angle on the left. No acute bony abnormality is seen. IMPRESSION: Mild infiltrate in the left lower lobe posteriorly. Electronically Signed   By: Inez Catalina M.D.   On: 04/25/2016 18:51    ____________________________________________   PROCEDURES  Procedure(s) performed: None  Procedures  Critical Care performed: Yes, see critical care note(s) ____________________________________________   INITIAL IMPRESSION / ASSESSMENT AND PLAN / ED COURSE  Pertinent labs & imaging results that were available during my care of the patient were reviewed by me and considered in my medical decision making (see chart for details).  82 y.o. nursing home patient presenting with altered mental status, tachycardia, fever, and possible respiratory symptoms. The cause of the patient's symptoms is unclear, but I am concerned about sepsis. Will do full septic workup and initiate  antibiotics. I ordered Zosyn, as the patient has a vancomycin allergy with rash. Plan admission to the hospital.  CRITICAL CARE Performed by: Eula Listen   Total critical care time: 45 minutes  Critical care time was exclusive of separately billable procedures and treating other patients.  Critical care was necessary to treat or prevent imminent or life-threatening deterioration.  Critical care was time spent personally by me on the following activities: development of treatment plan with patient and/or surrogate as well as nursing, discussions with consultants, evaluation of patient's response to treatment, examination of patient, obtaining history from patient or surrogate, ordering and performing treatments and interventions, ordering and review of laboratory studies, ordering and review of radiographic studies, pulse oximetry and re-evaluation of patient's condition.  ----------------------------------------- 7:43 PM on 04/25/2016 -----------------------------------------  I have reevaluated the patient, and updated the family about his condition. His urinalysis does not show UTI. After antipyretic and some fluid, the patient's heart rate has normalized and continues to maintain oxygen saturations of 96% and above on room air. He was febrile and received an antipyretic. His heart rate has come down from the 130s to the low high 90s at this time. He is responding to fluid resuscitation and is receiving antibiotics. Plan admission when the CT head and abdomen are returned. ____________________________________________  FINAL CLINICAL IMPRESSION(S) / ED DIAGNOSES  Final diagnoses:  HCAP (healthcare-associated pneumonia)  Sepsis, due to unspecified organism [facility])    Clinical Course       NEW MEDICATIONS STARTED DURING THIS VISIT:  New Prescriptions   No medications on file      Eula Listen, MD 04/25/16 1945

## 2016-04-25 NOTE — ED Notes (Signed)
Notified Dr. Posey Pronto of pt's blood pressure, VO received for 500cc bolus of NS at this time.

## 2016-04-25 NOTE — H&P (Signed)
[name] NAME: Adam Singleton    MR#:  [id number]  DATE OF BIRTH:  Jan 09, 1929  DATE OF ADMISSION:  04/25/2016  PRIMARY CARE PHYSICIAN: Lelon Huh, MD   REQUESTING/REFERRING PHYSICIAN: Dr. Mariea Clonts  CHIEF COMPLAINT:  High-grade fever with RG not his usual self  HISTORY OF PRESENT ILLNESS:  Adam Singleton  is a 82 y.o. male with a known history of Advanced dementia comes in from [facility], COPD, hyperlipidemia, malignant melanoma comes to the emergency room with high-grade fever of 102.4 tachycardia and workup showed patient has elevated white count of 16,000 and chest x-ray consistent with pneumonia. Patient's family was present in the earlier however  they left when I went to see the patient. Patient has advanced dementia and does not provide any meaningful history and/or review of system. He is currently sleeping. He had earlier fever of 102.4. Currently temperature is 100.40 using Tylenol and IV Levaquin in the emergency room. He is being admitted for sepsis secondary to pneumonia. His lactic acid is 2.1.  PAST MEDICAL HISTORY:   Past Medical History:  Diagnosis Date  . Alzheimer's disease 12/09/2009  . Cancer ([place])    malignant melanoma  . Clotting disorder (HCC)    Thrombocytopenia  . Colon polyps   . COPD (chronic obstructive pulmonary disease) ([place])   . Hyperlipidemia   . Hypertension   . Tachycardia     PAST SURGICAL HISTOIRY:   Past Surgical History:  Procedure Laterality Date  . KYPHOPLASTY N/A 06/29/2015   Procedure: KYPHOPLASTY L2;  Surgeon: Hessie Knows, MD;  Location: [facility] ORS;  Service: Orthopedics;  Laterality: N/A;  . MOHS SURGERY  12/07/2009   Moh's micrographic surgery for skin cancer    SOCIAL HISTORY:   Social History  Substance Use Topics  . Smoking status: Former Smoker    Packs/day: 1.00    Years: 40.00    Types: Cigarettes    Quit date: 05/16/1988  . Smokeless tobacco: Not on file   . Alcohol use No    FAMILY HISTORY:   Family History  Problem Relation Age of Onset  . Family history unknown: Yes    DRUG ALLERGIES:   Allergies  Allergen Reactions  . Haldol [Haloperidol Lactate] Other (See Comments)    hallucinations  . Vancomycin Hcl Rash    REVIEW OF SYSTEMS:  Review of Systems  Unable to perform ROS: Dementia     MEDICATIONS AT HOME:   Prior to Admission medications   Medication Sig Start Date End Date Taking? Authorizing Provider  acetaminophen (TYLENOL) 325 MG tablet Take 2 tablets by mouth every 4 (four) hours as needed. for mild pain or fever   Yes Historical Provider, MD  aspirin 81 MG EC tablet Take 1 tablet by mouth daily. 03/21/13  Yes Historical Provider, MD  bisacodyl (FLEET) 10 MG/30ML ENEM Place 10 mg rectally once.   Yes Historical Provider, MD  Dextromethorphan-Guaifenesin (ROBAFEN DM) 10-100 MG/5ML liquid Take 5 mLs by mouth every 4 (four) hours as needed (cough).   Yes Historical Provider, MD  donepezil (ARICEPT) 10 MG tablet Take 1 tablet by mouth at bedtime. 01/04/12  Yes Historical Provider, MD  ipratropium-albuterol (DUONEB) 0.5-2.5 (3) MG/3ML SOLN Take 3 mLs by nebulization 2 (two) times daily.   Yes Historical Provider, MD  lovastatin (MEVACOR) 20 MG tablet Take 1 tablet by mouth daily. 10/12/11  Yes Historical Provider, MD  memantine (NAMENDA) 10 MG tablet Take 1 tablet by mouth  2 (two) times daily.   Yes Historical Provider, MD  oxyCODONE (OXY IR/ROXICODONE) 5 MG immediate release tablet Take 1 tablet (5 mg total) by mouth every 4 (four) hours as needed for moderate pain (HOSPICE PATIENT). Patient taking differently: Take 5 mg by mouth 2 (two) times daily.  03/15/16  Yes Birdie Sons, MD  polyethylene glycol [facility] / GLYCOLAX) packet Take 17 g by mouth every other day.    Yes Historical Provider, MD  sennosides-docusate sodium (SENOKOT-S) 8.6-50 MG tablet Take 2 tablets by mouth 2 (two) times daily.    Yes Historical Provider,  MD  Travoprost, BAK Free, (TRAVATAN) 0.004 % SOLN ophthalmic solution Apply 1 drop to eye at bedtime. 12/09/09  Yes Historical Provider, MD      VITAL SIGNS:  Blood pressure 132/68, pulse (!) 107, temperature (!) 100.4 F (38 C), temperature source Rectal, resp. rate 18, height 5\' 7"  (1.702 m), weight 68 kg (150 lb), SpO2 100 %.  PHYSICAL EXAMINATION:  GENERAL:  82 y.o.-year-old patient lying in the bed with no acute distress.  EYES: Pupils equal, round, reactive to light and accommodation. No scleral icterus. Extraocular muscles intact.  HEENT: Head atraumatic, normocephalic. Oropharynx and nasopharynx clear.  NECK:  Supple, no jugular venous distention. No thyroid enlargement, no tenderness.  LUNGS:corase breath sounds bilaterally, no wheezing, rales,rhonchi or crepitation. No use of accessory muscles of respiration.  CARDIOVASCULAR: S1, S2 normal. No murmurs, rubs, or gallops.  ABDOMEN: Soft, nontender, nondistended. Bowel sounds present. No organomegaly or mass.  EXTREMITIES: No pedal edema, cyanosis, or clubbing.  NEUROLOGIC: Unable to assess secondary to dementia. Patient moves it spontaneously. PSYCHIATRIC:  Follow some commands however falls back. To sleep somewhat lethargic SKIN: No obvious rash, lesion, or ulcer.   LABORATORY PANEL:   CBC  Recent Labs Lab 04/25/16 1802  WBC 16.3*  HGB 13.7  HCT 42.3  PLT 172   ------------------------------------------------------------------------------------------------------------------  Chemistries   Recent Labs Lab 04/25/16 1802  NA 143  K 3.6  CL 109  CO2 25  GLUCOSE 115*  BUN 16  CREATININE 0.89  CALCIUM 8.5*  AST 24  ALT 14*  ALKPHOS 123  BILITOT 0.8   ------------------------------------------------------------------------------------------------------------------  Cardiac Enzymes No results for input(s): TROPONINI in the last 168  hours. ------------------------------------------------------------------------------------------------------------------  RADIOLOGY:  Ct Abdomen Pelvis Wo Contrast  Result Date: 04/25/2016 CLINICAL DATA:  Altered mental status, fever and difficulty breathing all of the all EXAM: CT ABDOMEN AND PELVIS WITHOUT CONTRAST TECHNIQUE: Multidetector CT imaging of the abdomen and pelvis was performed following the standard protocol without IV contrast. COMPARISON:  Chest radiograph 04/25/2016 FINDINGS: Lower chest: There is left lower lobe consolidation. No pleural effusion or pneumothorax are in right basilar atelectasis. Hepatobiliary: Normal hepatic size and contours. No perihepatic ascites. No intra- or extrahepatic biliary dilatation. There is calcification of gallbladder wall. Pancreas: Normal pancreatic contours. No peripancreatic fluid collection or pancreatic ductal dilatation. Spleen: Normal. Adrenals/Urinary Tract: Normal adrenal glands. No hydronephrosis. Central calcification at the left kidney is favored to be vascular. No abnormal perinephric stranding. No ureteral obstruction. Stomach/Bowel: There is a large amount of stool within the rectum. No evidence of colonic or enteric inflammation. No abdominal fluid collection. No portal venous gas. Appendix is not visualized. Vascular/Lymphatic: There is a small infrarenal abdominal aortic aneurysm measuring 3.7 cm. There is extensive atherosclerotic calcification of the abdominal aorta and its branch vessels. No abdominal or pelvic adenopathy. Reproductive: Normal prostate and seminal vesicles. Musculoskeletal: Vertebral augmentation cement is present within the L2  vertebral body. There is mild (approximately 10%) height loss of the L4 vertebral body, new compared to 06/26/2015 but otherwise age-indeterminate. Normal visualized extrathoracic and extraperitoneal soft tissues. Other: Bilateral fat containing inguinal hernias. IMPRESSION: 1. Left lower lobe  consolidation, likely pneumonia. 2. 3.7 cm infrarenal abdominal aortic aneurysm. Recommend followup by ultrasound in 2 years. This recommendation follows ACR consensus guidelines: White Paper of the ACR Incidental Findings Committee II on Vascular Findings. J Am Coll Radiol 2013; 10:789-794. 3. Porcelain gallbladder, unchanged. 4. Mild compression fracture of the L4 vertebral body, new compared to June 26, 2015, but otherwise age indeterminate. Electronically Signed   By: Ulyses Jarred M.D.   On: 04/25/2016 19:55   Dg Chest 2 View  Result Date: 04/25/2016 CLINICAL DATA:  Altered mental status and possible sepsis EXAM: CHEST  2 VIEW COMPARISON:  12/23/2013 FINDINGS: Cardiac shadow is within normal limits. Aortic calcifications are again seen and stable. The lungs are well-aerated with mild increased opacity in the posterior costophrenic angle on the left. No acute bony abnormality is seen. IMPRESSION: Mild infiltrate in the left lower lobe posteriorly. Electronically Signed   By: Inez Catalina M.D.   On: 04/25/2016 18:51   Ct Head Wo Contrast  Result Date: 04/25/2016 CLINICAL DATA:  Altered mental status EXAM: CT HEAD WITHOUT CONTRAST TECHNIQUE: Contiguous axial images were obtained from the base of the skull through the vertex without intravenous contrast. COMPARISON:  07/13/2009 FINDINGS: Brain: Diffuse atrophic and chronic white matter ischemic change are noted. No findings to suggest acute hemorrhage, acute infarction or space-occupying mass lesion are noted. Vascular: No hyperdense vessel or unexpected calcification. Skull: Normal. Negative for fracture or focal lesion. Sinuses/Orbits: No acute finding. Other: None. IMPRESSION: Chronic atrophic and ischemic changes.  No acute abnormality noted. Electronically Signed   By: Inez Catalina M.D.   On: 04/25/2016 19:43    EKG:   Sinus tachycardia, prolonged QT.  IMPRESSION AND PLAN:   Adam Singleton  is a 82 y.o. male with a known history of Advanced  dementia comes in from [facility], COPD, hyperlipidemia, malignant melanoma comes to the emergency room with high-grade fever of 102.4 tachycardia and workup showed patient has elevated white count of 16,000 and chest x-ray consistent with pneumonia.  1. Sepsis secondary to pneumonia -Admit to medical floor -IV Rocephin and Zithromax -Follow blood culture, fever curve, white count -IV fluids -Follow up lactic acid levels  2. Leukocytosis secondary to pneumonia  3. Advanced dementia patient is from [facility] -PT to see patient -Social worker for discharge planning  4. Acute on chronic tachycardia secondary to sepsis -Patient has no history of tachycardia as noted in his chart  5. DVT prophylaxis subcutaneous Lovenox  Patient care is out of facility DO NOT RESUSCITATE form  All the records are reviewed and case discussed with ED provider.Family had left prior to my arrival in the room  CODE STATUS: DO NOT RESUSCITATE  TOTAL TIME TAKING CARE OF THIS PATIENT:50  minutes.    Phuoc Huy M.D on 04/25/2016 at 9:53 PM  Between 7am to 6pm - Pager - [phone]  After 6pm go to [email or web address] - password [email or web address] [facility]  Office  [phone]  CC: Primary care physician; Lelon Huh, MD

## 2016-04-25 NOTE — Progress Notes (Addendum)
ANTIBIOTIC CONSULT NOTE - INITIAL  Pharmacy Consult for Levofloxacin  Indication: pneumonia  Allergies  Allergen Reactions  . Haldol [Haloperidol Lactate] Other (See Comments)    hallucinations  . Vancomycin Hcl Rash    Patient Measurements: Height: 5\' 7"  (170.2 cm) Weight: 150 lb (68 kg) IBW/kg (Calculated) : 66.1 Adjusted Body Weight:   Vital Signs: Temp: 100.4 F (38 C) (12/11 2033) Temp Source: Rectal (12/11 2033) BP: 89/54 (12/11 2215) Pulse Rate: 97 (12/11 2215) Intake/Output from previous day: No intake/output data recorded. Intake/Output from this shift: Total I/O In: 2450 [IV Piggyback:2450] Out: -   Labs:  Recent Labs  04/25/16 1802  WBC 16.3*  HGB 13.7  PLT 172  CREATININE 0.89   Estimated Creatinine Clearance: 56.7 mL/min (by C-G formula based on SCr of 0.89 mg/dL). No results for input(s): VANCOTROUGH, VANCOPEAK, VANCORANDOM, GENTTROUGH, GENTPEAK, GENTRANDOM, TOBRATROUGH, TOBRAPEAK, TOBRARND, AMIKACINPEAK, AMIKACINTROU, AMIKACIN in the last 72 hours.   Microbiology: No results found for this or any previous visit (from the past 720 hour(s)).  Medical History: Past Medical History:  Diagnosis Date  . Alzheimer's disease 12/09/2009  . Cancer ([place])    malignant melanoma  . Clotting disorder (HCC)    Thrombocytopenia  . Colon polyps   . COPD (chronic obstructive pulmonary disease) ([place])   . Hyperlipidemia   . Hypertension   . Tachycardia     Medications:  Scheduled:  . [START ON 04/26/2016] cefTRIAXone  1 g Intravenous Q24H   Assessment: CrCl = 56.7 ml/min   Goal of Therapy:  resolution of infection  Plan:  Expected duration 7 days with resolution of temperature and/or normalization of WBC   Levaquin 750 mg IV X 1 given in ED on 12/11 @ 20:41. Levaquin 500 mg IV Q24H ordered to start on 12/12 @ 18:00.   Per conversation with hospitalist will change abx to Zosyn. MRSA screen pending. Will add vanc if positive. 12/12 01:00 MRSA  screen (-)  Robbins,Jason D 04/25/2016,10:24 PM

## 2016-04-25 NOTE — ED Triage Notes (Signed)
Per [facility], patient comes from [facility] dementia unit. EMS was called out for breathing difficulty. Patient at baseline is verbal but is confused and does not make any sense. Patient today, is non verbal and will only grunt per staff at [facility]. Patient has spoke to ED staff here but does not answer questions freely. Wears chronic O2, COPD. Axillary temp 100.4.

## 2016-04-25 NOTE — ED Notes (Signed)
Informed RN lab called for recollect and reorder of PT, needs blue top tube and new order [id number]

## 2016-04-26 LAB — MRSA PCR SCREENING: MRSA by PCR: NEGATIVE

## 2016-04-26 LAB — PROCALCITONIN: PROCALCITONIN: 2.84 ng/mL

## 2016-04-26 MED ORDER — AMOXICILLIN-POT CLAVULANATE 600-42.9 MG/5ML PO SUSR
600.0000 mg | Freq: Two times a day (BID) | ORAL | Status: DC
  Administered 2016-04-26 – 2016-04-27 (×2): 600 mg via ORAL
  Filled 2016-04-26 (×3): qty 5

## 2016-04-26 MED ORDER — IPRATROPIUM-ALBUTEROL 0.5-2.5 (3) MG/3ML IN SOLN
3.0000 mL | Freq: Two times a day (BID) | RESPIRATORY_TRACT | Status: DC
  Administered 2016-04-26 – 2016-04-27 (×3): 3 mL via RESPIRATORY_TRACT
  Filled 2016-04-26 (×2): qty 3

## 2016-04-26 MED ORDER — SENNOSIDES-DOCUSATE SODIUM 8.6-50 MG PO TABS
2.0000 | ORAL_TABLET | Freq: Two times a day (BID) | ORAL | Status: DC
  Administered 2016-04-26 – 2016-04-27 (×2): 2 via ORAL
  Filled 2016-04-26 (×2): qty 2

## 2016-04-26 NOTE — Plan of Care (Signed)
Problem: Bowel/Gastric: Goal: Will not experience complications related to bowel motility Outcome: Progressing Pt has advanced dementia, is unable to participate in plan of care. Pt is mentating better today than on admission, speaking to staff when addressed, and cooperative with care. Pt has remained free of falls/injury this shift.

## 2016-04-26 NOTE — Progress Notes (Signed)
Dr. Marthann Schiller in at 1145 to see pt.

## 2016-04-26 NOTE — Progress Notes (Signed)
ANTIBIOTIC CONSULT NOTE - FOLLOW-UP  Pharmacy Consult for Zosyn   Indication:sepsis/ pneumonia  Allergies  Allergen Reactions  . Haldol [Haloperidol Lactate] Other (See Comments)    hallucinations  . Vancomycin Hcl Rash    Patient Measurements: Height: 5\' 7"  (170.2 cm) Weight: 147 lb 12.8 oz (67 kg) IBW/kg (Calculated) : 66.1   Vital Signs: Temp: 98.5 F (36.9 C) (12/12 0818) Temp Source: Oral (12/12 0818) BP: 106/47 (12/12 0818) Pulse Rate: 68 (12/12 0818) Intake/Output from previous day: 12/11 0701 - 12/12 0700 In: 3078.3 [I.V.:578.3; IV Piggyback:2500] Out: -  Intake/Output from this shift: No intake/output data recorded.  Labs:  Recent Labs  04/25/16 1802  WBC 16.3*  HGB 13.7  PLT 172  CREATININE 0.89   Estimated Creatinine Clearance: 56.7 mL/min (by C-G formula based on SCr of 0.89 mg/dL). No results for input(s): VANCOTROUGH, VANCOPEAK, VANCORANDOM, GENTTROUGH, GENTPEAK, GENTRANDOM, TOBRATROUGH, TOBRAPEAK, TOBRARND, AMIKACINPEAK, AMIKACINTROU, AMIKACIN in the last 72 hours.   Microbiology: Recent Results (from the past 720 hour(s))  Culture, blood (Routine x 2)     Status: None (Preliminary result)   Collection Time: 04/25/16  6:02 PM  Result Value Ref Range Status   Specimen Description BLOOD  LEFT WRIST  Final   Special Requests   Final    BOTTLES DRAWN AEROBIC AND ANAEROBIC  AER 4 ML ANA 3 ML   Culture NO GROWTH < 24 HOURS  Final   Report Status PENDING  Incomplete  Culture, blood (Routine x 2)     Status: None (Preliminary result)   Collection Time: 04/25/16  6:02 PM  Result Value Ref Range Status   Specimen Description BLOOD  LEFT HAND  Final   Special Requests   Final    BOTTLES DRAWN AEROBIC AND ANAEROBIC  AER 4ML ANA 4ML   Culture NO GROWTH < 24 HOURS  Final   Report Status PENDING  Incomplete  MRSA PCR Screening     Status: None   Collection Time: 04/25/16 11:15 PM  Result Value Ref Range Status   MRSA by PCR NEGATIVE NEGATIVE Final   Comment:        The GeneXpert MRSA Assay (FDA approved for NASAL specimens only), is one component of a comprehensive MRSA colonization surveillance program. It is not intended to diagnose MRSA infection nor to guide or monitor treatment for MRSA infections.     Medical History: Past Medical History:  Diagnosis Date  . Alzheimer's disease 12/09/2009  . Cancer ([place])    malignant melanoma  . Clotting disorder (HCC)    Thrombocytopenia  . Colon polyps   . COPD (chronic obstructive pulmonary disease) ([place])   . Hyperlipidemia   . Hypertension   . Tachycardia     Medications:  Scheduled:  . aspirin  81 mg Oral Daily  . donepezil  10 mg Oral QHS  . enoxaparin (LOVENOX) injection  40 mg Subcutaneous Q24H  . ipratropium-albuterol  3 mL Nebulization BID  . latanoprost  1 drop Both Eyes QHS  . memantine  10 mg Oral BID  . oxyCODONE  5 mg Oral BID  . piperacillin-tazobactam (ZOSYN)  IV  3.375 g Intravenous Q8H  . polyethylene glycol  17 g Oral QODAY  . pravastatin  20 mg Oral q1800   Assessment: Pt presents with respiratory distress. CrCl = 56.7 ml/min. She received one dose of Levaquin in the ED, but per conversation with hospitalist abx changed to [employer].   Goal of Therapy:  resolution of infection  Plan:  Expected duration 7 days with resolution of temperature and/or normalization of WBC   Continue Zosyn 3.375g EI IV q8h.   12/11 BCx: NGTD 12/11 UCx: in process  12/11 MRSA PCR: negative  12/11 CXR: consistent with PNA 12/11 UA: rare bacteria   Loree Fee, PharmD 04/26/2016,9:23 AM

## 2016-04-26 NOTE — Progress Notes (Signed)
[place] [facility] [place] NAME: Adam Singleton    MR#:  [id number]  DATE OF BIRTH:  1929-01-15  SUBJECTIVE:  CHIEF COMPLAINT:   Chief Complaint  Patient presents with  . Respiratory Distress    Sent from memory care unit with confusion and altered mental status, also had fever and elevated white blood cell count. Found to have pneumonia. His blood pressure was initially low but currently stable and patient is alert but disoriented because of his baseline dementia.  REVIEW OF SYSTEMS:   Because of altered mental status and dementia , patient is not able to give review of system. ROS  DRUG ALLERGIES:   Allergies  Allergen Reactions  . Haldol [Haloperidol Lactate] Other (See Comments)    hallucinations  . Vancomycin Hcl Rash    VITALS:  Blood pressure (!) 103/51, pulse (!) 59, temperature 98.5 F (36.9 C), temperature source Oral, resp. rate 20, height 5\' 7"  (1.702 m), weight 67 kg (147 lb 12.8 oz), SpO2 96 %.  PHYSICAL EXAMINATION:   GENERAL:  82 y.o.-year-old patient lying in the bed with no acute distress.  EYES: Pupils equal, round, reactive to light and accommodation. No scleral icterus. Extraocular muscles intact.  HEENT: Head atraumatic, normocephalic. Oropharynx and nasopharynx clear.  NECK:  Supple, no jugular venous distention. No thyroid enlargement, no tenderness.  LUNGS:corase breath sounds bilaterally, no wheezing, rales,rhonchi or crepitation. No use of accessory muscles of respiration.  CARDIOVASCULAR: S1, S2 normal. No murmurs, rubs, or gallops.  ABDOMEN: Soft, nontender, nondistended. Bowel sounds present. No organomegaly or mass.  EXTREMITIES: No pedal edema, cyanosis, or clubbing.  NEUROLOGIC: Unable to assess secondary to dementia. Patient moves it spontaneously.  PSYCHIATRIC:  Follow some commands however not oriented at all. SKIN: No obvious rash, lesion, or ulcer.   Physical Exam LABORATORY PANEL:   CBC  Recent  Labs Lab 04/25/16 1802  WBC 16.3*  HGB 13.7  HCT 42.3  PLT 172   ------------------------------------------------------------------------------------------------------------------  Chemistries   Recent Labs Lab 04/25/16 1802  NA 143  K 3.6  CL 109  CO2 25  GLUCOSE 115*  BUN 16  CREATININE 0.89  CALCIUM 8.5*  AST 24  ALT 14*  ALKPHOS 123  BILITOT 0.8   ------------------------------------------------------------------------------------------------------------------  Cardiac Enzymes  Recent Labs Lab 04/25/16 2310  TROPONINI <0.03   ------------------------------------------------------------------------------------------------------------------  RADIOLOGY:  Ct Abdomen Pelvis Wo Contrast  Result Date: 04/25/2016 CLINICAL DATA:  Altered mental status, fever and difficulty breathing all of the all EXAM: CT ABDOMEN AND PELVIS WITHOUT CONTRAST TECHNIQUE: Multidetector CT imaging of the abdomen and pelvis was performed following the standard protocol without IV contrast. COMPARISON:  Chest radiograph 04/25/2016 FINDINGS: Lower chest: There is left lower lobe consolidation. No pleural effusion or pneumothorax are in right basilar atelectasis. Hepatobiliary: Normal hepatic size and contours. No perihepatic ascites. No intra- or extrahepatic biliary dilatation. There is calcification of gallbladder wall. Pancreas: Normal pancreatic contours. No peripancreatic fluid collection or pancreatic ductal dilatation. Spleen: Normal. Adrenals/Urinary Tract: Normal adrenal glands. No hydronephrosis. Central calcification at the left kidney is favored to be vascular. No abnormal perinephric stranding. No ureteral obstruction. Stomach/Bowel: There is a large amount of stool within the rectum. No evidence of colonic or enteric inflammation. No abdominal fluid collection. No portal venous gas. Appendix is not visualized. Vascular/Lymphatic: There is a small infrarenal abdominal aortic aneurysm  measuring 3.7 cm. There is extensive atherosclerotic calcification of the abdominal aorta and its branch vessels. No abdominal or pelvic  adenopathy. Reproductive: Normal prostate and seminal vesicles. Musculoskeletal: Vertebral augmentation cement is present within the L2 vertebral body. There is mild (approximately 10%) height loss of the L4 vertebral body, new compared to 06/26/2015 but otherwise age-indeterminate. Normal visualized extrathoracic and extraperitoneal soft tissues. Other: Bilateral fat containing inguinal hernias. IMPRESSION: 1. Left lower lobe consolidation, likely pneumonia. 2. 3.7 cm infrarenal abdominal aortic aneurysm. Recommend followup by ultrasound in 2 years. This recommendation follows ACR consensus guidelines: White Paper of the ACR Incidental Findings Committee II on Vascular Findings. J Am Coll Radiol 2013; 10:789-794. 3. Porcelain gallbladder, unchanged. 4. Mild compression fracture of the L4 vertebral body, new compared to June 26, 2015, but otherwise age indeterminate. Electronically Signed   By: Ulyses Jarred M.D.   On: 04/25/2016 19:55   Dg Chest 2 View  Result Date: 04/25/2016 CLINICAL DATA:  Altered mental status and possible sepsis EXAM: CHEST  2 VIEW COMPARISON:  12/23/2013 FINDINGS: Cardiac shadow is within normal limits. Aortic calcifications are again seen and stable. The lungs are well-aerated with mild increased opacity in the posterior costophrenic angle on the left. No acute bony abnormality is seen. IMPRESSION: Mild infiltrate in the left lower lobe posteriorly. Electronically Signed   By: Inez Catalina M.D.   On: 04/25/2016 18:51   Ct Head Wo Contrast  Result Date: 04/25/2016 CLINICAL DATA:  Altered mental status EXAM: CT HEAD WITHOUT CONTRAST TECHNIQUE: Contiguous axial images were obtained from the base of the skull through the vertex without intravenous contrast. COMPARISON:  07/13/2009 FINDINGS: Brain: Diffuse atrophic and chronic white matter ischemic  change are noted. No findings to suggest acute hemorrhage, acute infarction or space-occupying mass lesion are noted. Vascular: No hyperdense vessel or unexpected calcification. Skull: Normal. Negative for fracture or focal lesion. Sinuses/Orbits: No acute finding. Other: None. IMPRESSION: Chronic atrophic and ischemic changes.  No acute abnormality noted. Electronically Signed   By: Inez Catalina M.D.   On: 04/25/2016 19:43    ASSESSMENT AND PLAN:   Active Problems:   Sepsis ([place])   Adam Singleton  is a 82 y.o. male with a known history of Advanced dementia comes in from [facility], COPD, hyperlipidemia, malignant melanoma comes to the emergency room with high-grade fever of 102.4 tachycardia and workup showed patient has elevated white count of 16,000 and chest x-ray consistent with pneumonia.  1. Sepsis secondary to pneumonia - Given IV zosyn on admisison, MRSA PCR negative. -Follow blood culture, fever curve, white count -IV fluids -Follow up lactic acid levels - improving now. Change to oral liquid augmentin.  2. Leukocytosis secondary to pneumonia  3. Advanced dementia patient is from [facility] -PT to see patient -Social worker for discharge planning  4. Acute on chronic tachycardia secondary to sepsis -Patient has no history of tachycardia as noted in his chart  5. DVT prophylaxis subcutaneous Lovenox  As per nurse, pt was able to swallow soft food and medications without any trouble with assistance.  All the records are reviewed and case discussed with Care Management/Social Workerr. Management plans discussed with the patient, family and they are in agreement.  CODE STATUS: DNR  TOTAL TIME TAKING CARE OF THIS PATIENT: 35 minutes.     POSSIBLE D/C IN 1-2 DAYS, DEPENDING ON CLINICAL CONDITION.   Vaughan Basta M.D on 04/26/2016   Between 7am to 6pm - Pager - [phone]  After 6pm go to [email or web address] - password [email or web address] [place]  [facility]  Office  [phone]  CC: Primary care physician; Elenore Rota  Caryn Section, MD  Note: This dictation was prepared with Dragon dictation along with smaller phrase technology. Any transcriptional errors that result from this process are unintentional.

## 2016-04-26 NOTE — Clinical Social Work Note (Addendum)
Clinical Social Work Assessment  Patient Details  Name: Adam Singleton MRN: [id number] Date of Birth: May 02, 1929  Date of referral:  04/26/16               Reason for consult:  Other (Comment Required) (From [place] )                Permission sought to share information with:  Facility Art therapist granted to share information::  Yes, Verbal Permission Granted  Name::        Agency::     Relationship::     Contact Information:     Housing/Transportation Living arrangements for the past 2 months:  [place] of Information:  Adult Children, Facility Patient Interpreter Needed:  None Criminal Activity/Legal Involvement Pertinent to Current Situation/Hospitalization:  No - Comment as needed Significant Relationships:  Adult Children Lives with:  Facility Resident Do you feel safe going back to the place where you live?    Need for family participation in patient care:  Yes (Comment)  Care giving concerns:  Patient is a long term care resident at [facility] unit (fax: [phone])    Social Worker assessment / plan: Clinical Social Worker (CSW) reviewed chart and noted that patient is from [facility]. CSW contacted [facility] and spoke with med tech Kenney Houseman. Per Kenney Houseman patient is a private pay resident and has been at [facility] for 2 years at least. Per med tech patient is on chronic oxygen at 2 liters and gets nebulizer's twice per day. Per med tech patient does very little walking and primary uses a wheel chair. Med tech reported that patient had hospice services at one time but has been discharged from hospice and was about to start PT with [facility]. RN case manager aware of above. Per Kenney Houseman patient can return when stable. CSW contacted patient's son Adam Singleton who confirmed what the med tech stated. Per [place] will transport patient in their wheel chair Lucianne Lei when he is ready to D/C. Per Adam Singleton he will  bring patient's wheel chair to [facility] to be transported in. CSW will continue to follow and assist as needed.    Employment status:  Retired Forensic scientist:  Medicare PT Recommendations:  Not assessed at this time [place] / Referral to community resources:  Other (Comment Required) (Patient will return to ALF )  Patient/Family's Response to care:  Patient's son Adam Singleton prefers for patient to return to [facility].   Patient/Family's Understanding of and Emotional Response to Diagnosis, Current Treatment, and Prognosis:  Patient's son was very pleasant and thanked CSW for assistance.   Emotional Assessment Appearance:  Appears stated age Attitude/Demeanor/Rapport:  Unable to Assess Affect (typically observed):  Unable to Assess Orientation:  Oriented to Self, Fluctuating Orientation (Suspected and/or reported [place]) Alcohol / Substance use:  Not Applicable Psych involvement (Current and /or in the community):  No (Comment)  Discharge Needs  Concerns to be addressed:  Discharge Planning Concerns Readmission within the last 30 days:  No Current discharge risk:  Chronically ill Barriers to Discharge:  Continued Medical Work up   [employer], Veronia Beets, LCSW 04/26/2016, 12:20 PM

## 2016-04-26 NOTE — NC FL2 (Signed)
[place] LEVEL OF CARE SCREENING TOOL     IDENTIFICATION  Patient Name: Adam Singleton Birthdate: Oct 23, 1928 Sex: male Admission Date (Current Location): 04/25/2016  [facility] and [place] Number:      Facility and Address:  [facility], [address] [place], [place] [address]      Provider Number: [id number]  Attending Physician Name and Address:  Vaughan Basta, MD  Relative Name and Phone Number:       Current Level of Care: Hospital Recommended Level of Care: [place], Memory Care Prior Approval Number:    Date Approved/Denied:   PASRR Number:    Discharge Plan: Domiciliary (Rest home) (Memory Care )    Current Diagnoses: Patient Active Problem List   Diagnosis Date Noted  . Sepsis ([place]) 04/25/2016  . Pressure ulcer 06/27/2015  . Compression fracture of L2 ([place]) 06/26/2015  . Back pain 06/26/2015  . 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 Emphysema lung ([place]) 06/26/2015  . Abdominal aortic aneurysm (AAA) ([place]) 06/26/2015  . Hyperglycemia 11/05/2014  . History of DVT (deep vein thrombosis) 11/05/2014  . 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 Personal history of PE (pulmonary embolism) 11/05/2014  . Obstruction to urinary outflow 11/05/2014  . History of colonic polyps 09/24/2014  . COPD (chronic obstructive pulmonary disease) ([place]) 09/24/2014  . H/O cardiovascular disorder 09/24/2014  . H/O Malignant melanoma 09/24/2014  . Prostate disease 09/24/2014  . Thrombocytopenia ([place]) 09/24/2014  . Alzheimer's dementia 12/09/2009  . Hyperlipidemia 12/09/2009  . Malignant neoplasm of scalp and skin of neck 11/09/2009    Orientation RESPIRATION BLADDER Height & Weight     Self  O2 (2 Liters Oxygen ) Incontinent Weight: 147 lb 12.8 oz (67 kg) Height:  5\' 7"  (D34-534 cm)  BEHAVIORAL SYMPTOMS/MOOD NEUROLOGICAL BOWEL NUTRITION STATUS   (none)  (none) Incontinent Diet (Soft Diet )  AMBULATORY STATUS COMMUNICATION OF NEEDS  Skin   Extensive Assist (Uses wheel chair primarily. ) Verbally Normal                       Personal Care Assistance Level of Assistance  Bathing, Feeding, Dressing Bathing Assistance: Limited assistance Feeding assistance: Limited assistance Dressing Assistance: Limited assistance     Functional Limitations Info  Sight, Hearing, Speech Sight Info: Adequate Hearing Info: Adequate Speech Info: Adequate    SPECIAL CARE FACTORS FREQUENCY  PT (By licensed PT), OT (By licensed OT)     PT Frequency:  (2-3 as per home health. ) OT Frequency:  (2-3 as per home health. )            Contractures      Additional Factors Info  Code Status, Allergies Code Status Info:  (DNR ) Allergies Info:  (Haldol Haloperidol Lactate, Vancomycin Hcl)           Current Medications (04/26/2016):  This is the current hospital active medication list Current Facility-Administered Medications  Medication Dose Route Frequency Provider Last Rate Last Dose  . 0.9 %  sodium chloride infusion   Intravenous Continuous Fritzi Mandes, MD 100 mL/hr at 04/26/16 0013    . acetaminophen (TYLENOL) tablet 650 mg  650 mg Oral Q6H PRN Fritzi Mandes, MD       Or  . acetaminophen (TYLENOL) suppository 650 mg  650 mg Rectal Q6H PRN Fritzi Mandes, MD      . albuterol (PROVENTIL) (2.5 MG/3ML) 0.083% nebulizer solution 2.5 mg  2.5 mg Nebulization Q6H PRN Fritzi Mandes, MD      .  aspirin chewable tablet 81 mg  81 mg Oral Daily Fritzi Mandes, MD   81 mg at 04/26/16 0929  . donepezil (ARICEPT) tablet 10 mg  10 mg Oral QHS Fritzi Mandes, MD      . enoxaparin (LOVENOX) injection 40 mg  40 mg Subcutaneous Q24H Fritzi Mandes, MD      . guaiFENesin-dextromethorphan (ROBITUSSIN DM) 100-10 MG/5ML syrup 5 mL  5 mL Oral Q4H PRN Fritzi Mandes, MD      . ipratropium-albuterol (DUONEB) 0.5-2.5 (3) MG/3ML nebulizer solution 3 mL  3 mL Nebulization BID Fritzi Mandes, MD   3 mL at 04/26/16 0752  . latanoprost (XALATAN) 0.005 % ophthalmic solution 1 drop  1  drop Both Eyes QHS Fritzi Mandes, MD   1 drop at 04/26/16 0501  . memantine (NAMENDA) tablet 10 mg  10 mg Oral BID Fritzi Mandes, MD   10 mg at 04/26/16 0929  . ondansetron (ZOFRAN) tablet 4 mg  4 mg Oral Q6H PRN Fritzi Mandes, MD       Or  . ondansetron (ZOFRAN) injection 4 mg  4 mg Intravenous Q6H PRN Fritzi Mandes, MD      . oxyCODONE (Oxy IR/ROXICODONE) immediate release tablet 5 mg  5 mg Oral BID Fritzi Mandes, MD   5 mg at 04/26/16 0929  . piperacillin-tazobactam (ZOSYN) IVPB 3.375 g  3.375 g Intravenous Q8H Alexis Hugelmeyer, DO   3.375 g at 04/26/16 0459  . polyethylene glycol (MIRALAX / GLYCOLAX) packet 17 g  17 g Oral QODAY Fritzi Mandes, MD   17 g at 04/26/16 0929  . pravastatin (PRAVACHOL) tablet 20 mg  20 mg Oral q1800 Fritzi Mandes, MD      . traMADol Veatrice Bourbon) tablet 50 mg  50 mg Oral Q6H PRN Fritzi Mandes, MD         Discharge Medications: Please see discharge summary for a list of discharge medications.  Relevant Imaging Results:  Relevant Lab Results:   Additional Information  (SSN: [id number])  Sample, Veronia Beets, LCSW

## 2016-04-26 NOTE — Progress Notes (Signed)
Shift assessment completed. Pt is easily awakened, opens his eyes to voice, does not look at this time, does not answer questions, does not follow commands. o2 on at [facility], respirations are shallow, lungs are decreased to bilat bases l>R. Telebox is intact, Sr noted. Abdomen is soft, bs heard. Pt is wearing incontinence brief, PPP, no edema noted, pt has scd's on bilat. PIV #20 intact to L arm with iv ns infusing at 167mls/hr, site is free of redness and swelling. Pt does not appear to be in pain. Srx2, call bell in reach.

## 2016-04-26 NOTE — Evaluation (Signed)
Physical Therapy Evaluation Patient Details Name: Adam Singleton MRN: [id number] DOB: November 15, 1928 Today's Date: 04/26/2016   History of Present Illness  Pt admitted for sepsis. History includes AAA, dementia, COPD, and malignant melanoma. Pt currently resides at [place] care  Clinical Impression  Pt is a pleasantly confused 83 year old male who was admitted for sepsis. Pt able to perform rolling in bed with max assist, unable to further perform transfers/ambulation secondary to cognitive deficits and weakness. Pt demonstrates deficits with strength/mobility/cognition. Pt is poor historian, however per chart review, limited mobility and resides in [facility] most of day. Would benefit from skilled PT to address above deficits and promote optimal return to PLOF. Recommend transition to [place] upon discharge from acute hospitalization pending ability to participate.        Follow Up Recommendations Supervision/Assistance - 24 hour;Home health PT (pending ability to participate)    Equipment Recommendations  None recommended by PT    Recommendations for Other Services       Precautions / Restrictions Precautions Precautions: Fall Restrictions Weight Bearing Restrictions: No      Mobility  Bed Mobility Overal bed mobility: Needs Assistance Bed Mobility: Rolling Rolling: Max assist         General bed mobility comments: assist required for rolling, easier to L side. Pt uses trapeze bar for assistance, however needs heavy cues for initiating of rolling.  Transfers                 General transfer comment: unable to attempt as pt has trouble following cues  Ambulation/Gait                Stairs            Wheelchair Mobility    Modified Rankin (Stroke Patients Only)       Balance                                             Pertinent Vitals/Pain Pain Assessment: Faces Faces Pain Scale: No hurt    Home Living Family/patient  expects to be discharged to:: Assisted living                 Additional Comments: Unsure at this time due to pt's advanced dementia.     Prior Function Level of Independence: Needs assistance   Gait / Transfers Assistance Needed: limited gait with RW and transfers with assistance in advanced memory care     Comments: advanced dementia; difficulty following commands. Per chart review, primarily sits in [facility]     Hand Dominance        Extremity/Trunk Assessment   Upper Extremity Assessment: Overall WFL for tasks assessed           Lower Extremity Assessment: Generalized weakness;Difficult to assess due to impaired cognition (unable to perform SLRs)         Communication   Communication: HOH  Cognition Arousal/Alertness: Awake/alert Behavior During Therapy: WFL for tasks assessed/performed Overall Cognitive Status: History of cognitive impairments - at baseline                      General Comments      Exercises Other Exercises Other Exercises: Pt performed supine ther-ex including B LE SLRs, hip abd/add, and heel slides. All ther-ex performed with max assist with little initation from pt. 10  reps performed. Heavy cues for participation   Assessment/Plan    PT Assessment Patient needs continued PT services  PT Problem List Decreased strength;Decreased cognition;Decreased mobility          PT Treatment Interventions DME instruction;Therapeutic exercise;Gait training    PT Goals (Current goals can be found in the Care Plan section)  Acute Rehab PT Goals Patient Stated Goal: to return back to [facility] PT Goal Formulation: Patient unable to participate in goal setting Time For Goal Achievement: 05/10/16 Potential to Achieve Goals: Fair    Frequency  (3 day trial for participation)   Barriers to discharge        Co-evaluation               End of Session   Activity Tolerance: Patient tolerated treatment well Patient left: in  bed;with bed alarm set Nurse Communication: Mobility status         Time: 1420-1435 PT Time Calculation (min) (ACUTE ONLY): 15 min   Charges:   PT Evaluation $PT Eval Low Complexity: 1 Procedure PT Treatments $Therapeutic Exercise: 8-22 mins   PT G Codes:        Marshell Rieger 04/29/2016, 5:18 PM  Greggory Stallion, PT, DPT [phone]

## 2016-04-26 NOTE — Care Management (Signed)
Case discussed with CSW. Facility is requesting patient return to [facility] with [facility]. Will follow and speak with family. Patient has Dementia

## 2016-04-27 LAB — URINE CULTURE: CULTURE: NO GROWTH

## 2016-04-27 LAB — BASIC METABOLIC PANEL
Anion gap: 6 (ref 5–15)
BUN: 12 mg/dL (ref 6–20)
CHLORIDE: 113 mmol/L — AB (ref 101–111)
CO2: 21 mmol/L — AB (ref 22–32)
Calcium: 7.8 mg/dL — ABNORMAL LOW (ref 8.9–10.3)
Creatinine, Ser: 0.5 mg/dL — ABNORMAL LOW (ref 0.61–1.24)
GFR calc Af Amer: >60 mL/min (ref >60)
GFR calc non Af Amer: >60 mL/min (ref >60)
Glucose, Bld: 93 mg/dL (ref 65–99)
POTASSIUM: 3.4 mmol/L — AB (ref 3.5–5.1)
SODIUM: 140 mmol/L (ref 135–145)

## 2016-04-27 LAB — CBC
HEMATOCRIT: 33.7 % — AB (ref 40.0–52.0)
HEMOGLOBIN: 11.2 g/dL — AB (ref 13.0–18.0)
MCH: 30.3 pg (ref 26.0–34.0)
MCHC: 33.2 g/dL (ref 32.0–36.0)
MCV: 91.4 fL (ref 80.0–100.0)
Platelets: 98 10*3/uL — ABNORMAL LOW (ref 150–440)
RBC: 3.69 MIL/uL — AB (ref 4.40–5.90)
RDW: 15.5 % — ABNORMAL HIGH (ref 11.5–14.5)
WBC: 10 10*3/uL (ref 3.8–10.6)

## 2016-04-27 MED ORDER — AMOXICILLIN-POT CLAVULANATE 600-42.9 MG/5ML PO SUSR: 600.0000 mg | Freq: Two times a day (BID) | ORAL | 0 refills | Status: DC

## 2016-04-27 MED ORDER — POTASSIUM CHLORIDE CRYS ER 20 MEQ PO TBCR
20.0000 meq | EXTENDED_RELEASE_TABLET | Freq: Once | ORAL | Status: AC
  Administered 2016-04-27: 20 meq via ORAL
  Filled 2016-04-27: qty 1

## 2016-04-27 NOTE — NC FL2 (Signed)
[place] LEVEL OF CARE SCREENING TOOL     IDENTIFICATION  Patient Name: Adam Singleton Birthdate: [birth year] Sex: male Admission Date (Current Location): 04/25/2016  [facility] and [place] Number:      Facility and Address:  [facility], [address], [place], [place] [address]      Provider Number: [id number]  Attending Physician Name and Address:  Fritzi Mandes, MD  Relative Name and Phone Number:       Current Level of Care: Hospital Recommended Level of Care: [place], Memory Care Prior Approval Number:    Date Approved/Denied:   PASRR Number:    Discharge Plan: Domiciliary (Rest home) (Memory Care )    Current Diagnoses: Patient Active Problem List   Diagnosis Date Noted  . Sepsis ([place]) 04/25/2016  . Pressure ulcer 06/27/2015  . Compression fracture of L2 ([place]) 06/26/2015  . Back pain 06/26/2015  . 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 Emphysema lung ([place]) 06/26/2015  . Abdominal aortic aneurysm (AAA) ([place]) 06/26/2015  . Hyperglycemia 11/05/2014  . History of DVT (deep vein thrombosis) 11/05/2014  . 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 Personal history of PE (pulmonary embolism) 11/05/2014  . Obstruction to urinary outflow 11/05/2014  . History of colonic polyps 09/24/2014  . COPD (chronic obstructive pulmonary disease) ([place]) 09/24/2014  . H/O cardiovascular disorder 09/24/2014  . H/O Malignant melanoma 09/24/2014  . Prostate disease 09/24/2014  . Thrombocytopenia ([place]) 09/24/2014  . Alzheimer's dementia 12/09/2009  . Hyperlipidemia 12/09/2009  . Malignant neoplasm of scalp and skin of neck 11/09/2009    Orientation RESPIRATION BLADDER Height & Weight     Self  O2 (2 Liters Oxygen ) Incontinent Weight: 147 lb 12.8 oz (67 kg) Height:  5\' 7"  (170.2 cm)  BEHAVIORAL SYMPTOMS/MOOD NEUROLOGICAL BOWEL NUTRITION STATUS   (none)  (none) Incontinent Diet (Soft Diet )  AMBULATORY STATUS COMMUNICATION OF NEEDS Skin    Extensive Assist (Uses wheel chair primarily. ) Verbally Normal                       Personal Care Assistance Level of Assistance  Bathing, Feeding, Dressing Bathing Assistance: Limited assistance Feeding assistance: Limited assistance Dressing Assistance: Limited assistance     Functional Limitations Info  Sight, Hearing, Speech Sight Info: Adequate Hearing Info: Adequate Speech Info: Adequate    SPECIAL CARE FACTORS FREQUENCY  PT (By licensed PT), OT (By licensed OT)     PT Frequency:  (2-3 as per home health. ) OT Frequency:  (2-3 as per home health. )            Contractures      Additional Factors Info  Code Status, Allergies Code Status Info:  (DNR ) Allergies Info:  (Haldol Haloperidol Lactate, Vancomycin Hcl)          Discharge Medications: Please see discharge summary for a list of discharge medications. Current Discharge Medication List       START taking these medications   Details  amoxicillin-clavulanate (AUGMENTIN) 600-42.9 MG/5ML suspension Take 5 mLs (600 mg total) by mouth 2 (two) times daily. For 7 days Qty: 200 mL, Refills: 0         CONTINUE these medications which have NOT CHANGED   Details  acetaminophen (TYLENOL) 325 MG tablet Take 2 tablets by mouth every 4 (four) hours as needed. for mild pain or fever    aspirin 81 MG EC tablet Take 1 tablet by mouth daily.  bisacodyl (FLEET) 10 MG/30ML ENEM Place 10 mg rectally once.    Dextromethorphan-Guaifenesin (ROBAFEN DM) 10-100 MG/5ML liquid Take 5 mLs by mouth every 4 (four) hours as needed (cough).    donepezil (ARICEPT) 10 MG tablet Take 1 tablet by mouth at bedtime.    ipratropium-albuterol (DUONEB) 0.5-2.5 (3) MG/3ML SOLN Take 3 mLs by nebulization 2 (two) times daily.    lovastatin (MEVACOR) 20 MG tablet Take 1 tablet by mouth daily.    memantine (NAMENDA) 10 MG tablet Take 1 tablet by mouth 2 (two) times daily.    oxyCODONE (OXY IR/ROXICODONE) 5 MG  immediate release tablet Take 1 tablet (5 mg total) by mouth every 4 (four) hours as needed for moderate pain (HOSPICE PATIENT). Qty: 60 tablet, Refills: 0    polyethylene glycol (MIRALAX / GLYCOLAX) packet Take 17 g by mouth every other day.     sennosides-docusate sodium (SENOKOT-S) 8.6-50 MG tablet Take 2 tablets by mouth 2 (two) times daily.     Travoprost, BAK Free, (TRAVATAN) 0.004 % SOLN ophthalmic solution Apply 1 drop to eye at bedtime.    Relevant Imaging Results: Relevant Lab Results: Additional Information  (SSN: [id number])  Sample, Veronia Beets, LCSW

## 2016-04-27 NOTE — Discharge Summary (Addendum)
[place] at [place] NAME: Adam Singleton    MR#:  [id number]  DATE OF BIRTH:  1929/02/10  DATE OF ADMISSION:  04/25/2016 ADMITTING PHYSICIAN: Fritzi Mandes, MD  DATE OF DISCHARGE: 04/27/16 PRIMARY CARE PHYSICIAN: Lelon Huh, MD    ADMISSION DIAGNOSIS:  HCAP (healthcare-associated pneumonia) [J18.9] Sepsis, due to unspecified organism [facility]) [A41.9] Pneumonia of left lower lobe due to infectious organism ([place]) [J18.1]  DISCHARGE DIAGNOSIS:  Sepsis due to Pneumonia Alzheimer's dementia  SECONDARY DIAGNOSIS:   Past Medical History:  Diagnosis Date  . Alzheimer's disease 12/09/2009  . Cancer ([place])    malignant melanoma  . Clotting disorder (HCC)    Thrombocytopenia  . Colon polyps   . COPD (chronic obstructive pulmonary disease) ([place])   . Hyperlipidemia   . Hypertension   . 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  1. Sepsis secondary to pneumonia - Given IV zosyn on admisison, MRSA PCR negative. -negative blood culture, fever curve, white count -received IV fluids - improving now. Change to oral liquid augmentin.  2. Leukocytosis secondary to pneumonia  3. Advanced dementia patient is from [facility] -PT to see patient -Social worker for discharge planning---back to [employer]  4. Acute on chronic tachycardia secondary to sepsis -Patient has no history of tachycardia as noted in his chart  5. DVT prophylaxis subcutaneous Lovenox  As per nurse, pt was able to swallow soft food and medications without any trouble with assistance.  Doing well and appears at baseline D/c today Spoke with pt's  son  CONSULTS OBTAINED:    DRUG ALLERGIES:   Allergies  Allergen Reactions  . Haldol [Haloperidol Lactate] Other (See Comments)    hallucinations  . Vancomycin Hcl Rash    DISCHARGE MEDICATIONS:   Current Discharge Medication List    START taking these medications   Details  amoxicillin-clavulanate (AUGMENTIN) 600-42.9 MG/5ML suspension Take 5 mLs (600 mg total) by mouth 2 (two) times daily. For 7 days Qty: 200 mL, Refills: 0      CONTINUE these medications which have NOT CHANGED   Details  acetaminophen (TYLENOL) 325 MG tablet Take 2 tablets by mouth every 4 (four) hours as needed. for mild pain or fever    aspirin 81 MG EC tablet Take 1 tablet by mouth daily.    bisacodyl (FLEET) 10 MG/30ML ENEM Place 10 mg rectally once.    Dextromethorphan-Guaifenesin (ROBAFEN DM) 10-100 MG/5ML liquid Take 5 mLs by mouth every 4 (four) hours as needed (cough).    donepezil (ARICEPT) 10 MG tablet Take 1 tablet by mouth at bedtime.    ipratropium-albuterol (DUONEB) 0.5-2.5 (3) MG/3ML SOLN Take 3 mLs by nebulization 2 (two) times daily.    lovastatin (MEVACOR) 20 MG tablet Take 1 tablet by mouth daily.    memantine (NAMENDA) 10 MG tablet Take 1 tablet by mouth 2 (two) times daily.    oxyCODONE (OXY IR/ROXICODONE) 5 MG immediate release tablet Take 1 tablet (5 mg total) by mouth every 4 (four) hours as needed for moderate pain (HOSPICE PATIENT). Qty: 60 tablet, Refills: 0    polyethylene glycol (MIRALAX / GLYCOLAX) packet Take 17 g by mouth every other day.  sennosides-docusate sodium (SENOKOT-S) 8.6-50 MG tablet Take 2 tablets by mouth 2 (two) times daily.     Travoprost, BAK Free, (TRAVATAN) 0.004 % SOLN ophthalmic solution Apply 1 drop to eye at bedtime.        If you experience worsening of your admission symptoms, develop shortness of breath, life threatening emergency, suicidal or homicidal thoughts you must seek medical attention immediately by calling 911 or calling  your MD immediately  if symptoms less severe.  You Must read complete instructions/literature along with all the possible adverse reactions/side effects for all the Medicines you take and that have been prescribed to you. Take any new Medicines after you have completely understood and accept all the possible adverse reactions/side effects.   Please note  You were cared for by a hospitalist during your hospital stay. If you have any questions about your discharge medications or the care you received while you were in the hospital after you are discharged, you can call the unit and asked to speak with the hospitalist on call if the hospitalist that took care of you is not available. Once you are discharged, your primary care physician will handle any further medical issues. Please note that NO REFILLS for any discharge medications will be authorized once you are discharged, as it is imperative that you return to your primary care physician (or establish a relationship with a primary care physician if you do not have one) for your aftercare needs so that they can reassess your need for medications and monitor your lab values. Today   SUBJECTIVE   Doing ok Pleasant confusion  VITAL SIGNS:  Blood pressure (!) 103/47, pulse (!) 53, temperature 98.7 F (37.1 C), temperature source Oral, resp. rate 18, height 5\' 7"  (1.702 m), weight 67 kg (147 lb 12.8 oz), SpO2 94 %.  I/O:    Intake/Output Summary (Last 24 hours) at 04/27/16 1457 Last data filed at 04/27/16 0947  Gross per 24 hour  Intake              570 ml  Output                0 ml  Net              570 ml    PHYSICAL EXAMINATION:  GENERAL:  82 y.o.-year-old patient lying in the bed with no acute distress.  EYES: Pupils equal, round, reactive to light and accommodation. No scleral icterus. Extraocular muscles intact.  HEENT: Head atraumatic, normocephalic. Oropharynx and nasopharynx clear.  NECK:  Supple, no jugular venous distention. No  thyroid enlargement, no tenderness.  LUNGS: Normal breath sounds bilaterally, no wheezing, rales,rhonchi or crepitation. No use of accessory muscles of respiration.  CARDIOVASCULAR: S1, S2 normal. No murmurs, rubs, or gallops.  ABDOMEN: Soft, non-tender, non-distended. Bowel sounds present. No organomegaly or mass.  EXTREMITIES: No pedal edema, cyanosis, or clubbing.  NEUROLOGIC: non focal. Limited exam due to dementia PSYCHIATRIC: The patient is alert and oriented x 3.  SKIN: No obvious rash, lesion, or ulcer.   DATA REVIEW:   CBC   Recent Labs Lab 04/27/16 0423  WBC 10.0  HGB 11.2*  HCT 33.7*  PLT 98*    Chemistries   Recent Labs Lab 04/25/16 1802 04/27/16 0423  NA 143 140  K 3.6 3.4*  CL 109 113*  CO2 25 21*  GLUCOSE 115* 93  BUN 16 12  CREATININE 0.89 0.50*  CALCIUM 8.5* 7.8*  AST 24  --   ALT  14*  --   ALKPHOS 123  --   BILITOT 0.8  --     Microbiology Results   Recent Results (from the past 240 hour(s))  Culture, blood (Routine x 2)     Status: None (Preliminary result)   Collection Time: 04/25/16  6:02 PM  Result Value Ref Range Status   Specimen Description BLOOD  LEFT WRIST  Final   Special Requests   Final    BOTTLES DRAWN AEROBIC AND ANAEROBIC  AER 4 ML ANA 3 ML   Culture NO GROWTH 2 DAYS  Final   Report Status PENDING  Incomplete  Culture, blood (Routine x 2)     Status: None (Preliminary result)   Collection Time: 04/25/16  6:02 PM  Result Value Ref Range Status   Specimen Description BLOOD  LEFT HAND  Final   Special Requests   Final    BOTTLES DRAWN AEROBIC AND ANAEROBIC  AER 4ML ANA 4ML   Culture NO GROWTH 2 DAYS  Final   Report Status PENDING  Incomplete  Urine culture     Status: None   Collection Time: 04/25/16  6:02 PM  Result Value Ref Range Status   Specimen Description URINE, RANDOM  Final   Special Requests NONE  Final   Culture NO GROWTH Performed at [facility]   Final   Report Status 04/27/2016 FINAL  Final  MRSA  PCR Screening     Status: None   Collection Time: 04/25/16 11:15 PM  Result Value Ref Range Status   MRSA by PCR NEGATIVE NEGATIVE Final    Comment:        The GeneXpert MRSA Assay (FDA approved for NASAL specimens only), is one component of a comprehensive MRSA colonization surveillance program. It is not intended to diagnose MRSA infection nor to guide or monitor treatment for MRSA infections.     RADIOLOGY:  Ct Abdomen Pelvis Wo Contrast  Result Date: 04/25/2016 CLINICAL DATA:  Altered mental status, fever and difficulty breathing all of the all EXAM: CT ABDOMEN AND PELVIS WITHOUT CONTRAST TECHNIQUE: Multidetector CT imaging of the abdomen and pelvis was performed following the standard protocol without IV contrast. COMPARISON:  Chest radiograph 04/25/2016 FINDINGS: Lower chest: There is left lower lobe consolidation. No pleural effusion or pneumothorax are in right basilar atelectasis. Hepatobiliary: Normal hepatic size and contours. No perihepatic ascites. No intra- or extrahepatic biliary dilatation. There is calcification of gallbladder wall. Pancreas: Normal pancreatic contours. No peripancreatic fluid collection or pancreatic ductal dilatation. Spleen: Normal. Adrenals/Urinary Tract: Normal adrenal glands. No hydronephrosis. Central calcification at the left kidney is favored to be vascular. No abnormal perinephric stranding. No ureteral obstruction. Stomach/Bowel: There is a large amount of stool within the rectum. No evidence of colonic or enteric inflammation. No abdominal fluid collection. No portal venous gas. Appendix is not visualized. Vascular/Lymphatic: There is a small infrarenal abdominal aortic aneurysm measuring 3.7 cm. There is extensive atherosclerotic calcification of the abdominal aorta and its branch vessels. No abdominal or pelvic adenopathy. Reproductive: Normal prostate and seminal vesicles. Musculoskeletal: Vertebral augmentation cement is present within the L2  vertebral body. There is mild (approximately 10%) height loss of the L4 vertebral body, new compared to 06/26/2015 but otherwise age-indeterminate. Normal visualized extrathoracic and extraperitoneal soft tissues. Other: Bilateral fat containing inguinal hernias. IMPRESSION: 1. Left lower lobe consolidation, likely pneumonia. 2. 3.7 cm infrarenal abdominal aortic aneurysm. Recommend followup by ultrasound in 2 years. This recommendation follows ACR consensus guidelines: White Paper of the ACR  Incidental Findings Committee II on Vascular Findings. J Am Coll Radiol 2013; 10:789-794. 3. Porcelain gallbladder, unchanged. 4. Mild compression fracture of the L4 vertebral body, new compared to June 26, 2015, but otherwise age indeterminate. Electronically Signed   By: Ulyses Jarred M.D.   On: 04/25/2016 19:55   Dg Chest 2 View  Result Date: 04/25/2016 CLINICAL DATA:  Altered mental status and possible sepsis EXAM: CHEST  2 VIEW COMPARISON:  12/23/2013 FINDINGS: Cardiac shadow is within normal limits. Aortic calcifications are again seen and stable. The lungs are well-aerated with mild increased opacity in the posterior costophrenic angle on the left. No acute bony abnormality is seen. IMPRESSION: Mild infiltrate in the left lower lobe posteriorly. Electronically Signed   By: Inez Catalina M.D.   On: 04/25/2016 18:51   Ct Head Wo Contrast  Result Date: 04/25/2016 CLINICAL DATA:  Altered mental status EXAM: CT HEAD WITHOUT CONTRAST TECHNIQUE: Contiguous axial images were obtained from the base of the skull through the vertex without intravenous contrast. COMPARISON:  07/13/2009 FINDINGS: Brain: Diffuse atrophic and chronic white matter ischemic change are noted. No findings to suggest acute hemorrhage, acute infarction or space-occupying mass lesion are noted. Vascular: No hyperdense vessel or unexpected calcification. Skull: Normal. Negative for fracture or focal lesion. Sinuses/Orbits: No acute finding. Other:  None. IMPRESSION: Chronic atrophic and ischemic changes.  No acute abnormality noted. Electronically Signed   By: Inez Catalina M.D.   On: 04/25/2016 19:43     Management plans discussed with the patient, family and they are in agreement.  CODE STATUS:     Code Status Orders        Start     Ordered   04/25/16 2155  Do not attempt resuscitation (DNR)  Continuous    Question Answer Comment  In the event of cardiac or respiratory ARREST Do not call a "code blue"   In the event of cardiac or respiratory ARREST Do not perform Intubation, CPR, defibrillation or ACLS   In the event of cardiac or respiratory ARREST Use medication by any route, position, wound care, and other measures to relive pain and suffering. May use oxygen, suction and manual treatment of airway obstruction as needed for comfort.      04/25/16 2154    Code Status History    Date Active Date Inactive Code Status Order ID Comments User Context   04/25/2016  9:52 PM 04/25/2016  9:52 PM DNR [id number]  Fritzi Mandes, MD ED   06/26/2015  7:39 PM 06/30/2015  6:25 PM DNR [id number]  Danford Bad, RN Inpatient   06/26/2015  6:56 PM 06/26/2015  7:39 PM Full Code [id number]  Theodoro Grist, MD Inpatient    Advance Directive Documentation   Flowsheet Row Most Recent Value  Type of Advance Directive  Out of facility DNR (pink MOST or yellow form)  Pre-existing out of facility DNR order (yellow form or pink MOST form)  No data  "MOST" Form in Place?  No data      TOTAL TIME TAKING CARE OF THIS PATIENT: 40 minutes.    Militza Devery M.D on 04/27/2016 at 2:57 PM  Between 7am to 6pm - Pager - [phone] After 6pm go to [email or web address] - password [email or web address] [facility]  Office  [phone]  CC: Primary care physician; Lelon Huh, MD

## 2016-04-27 NOTE — Progress Notes (Signed)
Pt being discharged to [facility]. Report given to RN at facility, all questions answered. PIV removed. Discharge instructions/prescriptions provided in packet. Family is aware and in agreement of plan. He is leaving with all his belongings. Will be transported via medical wheel chair van to ALF.

## 2016-04-27 NOTE — Progress Notes (Signed)
Patient is medically stable for D/C back to [facility]. Clinical Education officer, museum (CSW) sent D/C orders to [facility]. RN will call report. Patient's son Ulice Dash has arranged for Melvin wheel chair Lucianne Lei to pick patient up at [facility] at 5 pm. Per son he will bring patient's wheel chair and oxygen tank from ALF to [facility]. Please reconsult if future social work needs arise. CSW signing off.   [employer], LCSW [phone]

## 2016-04-27 NOTE — Discharge Instructions (Signed)
Use oxygen as before °

## 2016-04-27 NOTE — Care Management (Signed)
CSW to send home health order with discharge orders to facility for [facility] PT follow up with [facility]

## 2016-04-28 ENCOUNTER — Telehealth: Payer: Self-pay

## 2016-04-28 ENCOUNTER — Encounter: Payer: Medicare Other | Admitting: Family Medicine

## 2016-04-28 DIAGNOSIS — R2689 Other abnormalities of gait and mobility: Secondary | ICD-10-CM | POA: Diagnosis not present

## 2016-04-28 DIAGNOSIS — J449 Chronic obstructive pulmonary disease, unspecified: Secondary | ICD-10-CM | POA: Diagnosis not present

## 2016-04-28 DIAGNOSIS — G309 Alzheimer's disease, unspecified: Secondary | ICD-10-CM | POA: Diagnosis not present

## 2016-04-28 DIAGNOSIS — I10 Essential (primary) hypertension: Secondary | ICD-10-CM | POA: Diagnosis not present

## 2016-04-28 DIAGNOSIS — Z9981 Dependence on supplemental oxygen: Secondary | ICD-10-CM | POA: Diagnosis not present

## 2016-04-28 DIAGNOSIS — F0280 Dementia in other diseases classified elsewhere without behavioral disturbance: Secondary | ICD-10-CM | POA: Diagnosis not present

## 2016-04-28 NOTE — Telephone Encounter (Signed)
Called son (on HIPAA) to do TCM follow-up call for pt and he had not checked in with the nursing facility today to see how pt is doing. I will call son back this afternoon to follow-up on pts status update.

## 2016-04-28 NOTE — Telephone Encounter (Signed)
Transition Care Management Follow-up Telephone Call    Date discharged? 04/27/16  How have you been since you were released from the hospital? Per [name] at [facility] pt is doing well, is active and eating.  Any patient concerns? none    Items Reviewed:  Medications reviewed: Yes  Allergies reviewed: Yes  Dietary changes reviewed: N/A  Referrals reviewed: N/A   Functional Questionnaire:  Independent - I Dependent - D    Activities of Daily Living (ADLs):    Personal hygiene - D Dressing - D Eating - D Maintaining continence - wears depends Transferring - D  Independent Activities of Daily Living (iADLs): Basic communication skills - I Transportation - D Meal preparation - D Shopping - D Housework - D Managing medications - [place] [facility]  Managing personal finances - son, Vivaan Busbin   Confirmed importance and date/time of follow-up visits scheduled YES  Provider Appointment booked with PCP 05/06/16 @ 9:30 AM  Confirmed with patient if condition begins to worsen call PCP or go to the ER.  Patient was given the office number and encouraged to call back with question or concerns: YES

## 2016-04-29 ENCOUNTER — Other Ambulatory Visit: Payer: Self-pay | Admitting: *Deleted

## 2016-04-29 ENCOUNTER — Encounter (INDEPENDENT_AMBULATORY_CARE_PROVIDER_SITE_OTHER): Payer: Medicare Other | Admitting: Family Medicine

## 2016-04-29 DIAGNOSIS — I10 Essential (primary) hypertension: Secondary | ICD-10-CM | POA: Diagnosis not present

## 2016-04-29 DIAGNOSIS — M6281 Muscle weakness (generalized): Secondary | ICD-10-CM

## 2016-04-29 DIAGNOSIS — J449 Chronic obstructive pulmonary disease, unspecified: Secondary | ICD-10-CM | POA: Diagnosis not present

## 2016-04-29 DIAGNOSIS — R2689 Other abnormalities of gait and mobility: Secondary | ICD-10-CM

## 2016-04-29 DIAGNOSIS — J159 Unspecified bacterial pneumonia: Secondary | ICD-10-CM

## 2016-04-29 DIAGNOSIS — G309 Alzheimer's disease, unspecified: Secondary | ICD-10-CM | POA: Diagnosis not present

## 2016-04-29 DIAGNOSIS — F0280 Dementia in other diseases classified elsewhere without behavioral disturbance: Secondary | ICD-10-CM | POA: Diagnosis not present

## 2016-04-29 MED ORDER — OXYCODONE HCL 5 MG PO TABS: 5.0000 mg | ORAL_TABLET | ORAL | 0 refills | Status: DC | PRN

## 2016-04-30 LAB — CULTURE, BLOOD (ROUTINE X 2)
CULTURE: NO GROWTH
CULTURE: NO GROWTH

## 2016-05-02 DIAGNOSIS — I10 Essential (primary) hypertension: Secondary | ICD-10-CM | POA: Diagnosis not present

## 2016-05-02 DIAGNOSIS — G309 Alzheimer's disease, unspecified: Secondary | ICD-10-CM | POA: Diagnosis not present

## 2016-05-02 DIAGNOSIS — R2689 Other abnormalities of gait and mobility: Secondary | ICD-10-CM | POA: Diagnosis not present

## 2016-05-02 DIAGNOSIS — F0280 Dementia in other diseases classified elsewhere without behavioral disturbance: Secondary | ICD-10-CM | POA: Diagnosis not present

## 2016-05-02 DIAGNOSIS — Z9981 Dependence on supplemental oxygen: Secondary | ICD-10-CM | POA: Diagnosis not present

## 2016-05-02 DIAGNOSIS — J449 Chronic obstructive pulmonary disease, unspecified: Secondary | ICD-10-CM | POA: Diagnosis not present

## 2016-05-05 DIAGNOSIS — F0280 Dementia in other diseases classified elsewhere without behavioral disturbance: Secondary | ICD-10-CM | POA: Diagnosis not present

## 2016-05-05 DIAGNOSIS — Z9981 Dependence on supplemental oxygen: Secondary | ICD-10-CM | POA: Diagnosis not present

## 2016-05-05 DIAGNOSIS — J449 Chronic obstructive pulmonary disease, unspecified: Secondary | ICD-10-CM | POA: Diagnosis not present

## 2016-05-05 DIAGNOSIS — G309 Alzheimer's disease, unspecified: Secondary | ICD-10-CM | POA: Diagnosis not present

## 2016-05-05 DIAGNOSIS — R2689 Other abnormalities of gait and mobility: Secondary | ICD-10-CM | POA: Diagnosis not present

## 2016-05-05 DIAGNOSIS — I10 Essential (primary) hypertension: Secondary | ICD-10-CM | POA: Diagnosis not present

## 2016-05-06 ENCOUNTER — Inpatient Hospital Stay: Payer: Self-pay | Admitting: Family Medicine

## 2016-05-10 ENCOUNTER — Encounter: Payer: Self-pay | Admitting: Family Medicine

## 2016-05-10 ENCOUNTER — Ambulatory Visit (INDEPENDENT_AMBULATORY_CARE_PROVIDER_SITE_OTHER): Payer: Medicare Other | Admitting: Family Medicine

## 2016-05-10 VITALS — BP 132/60 | HR 72 | Temp 98.5°F | Resp 16

## 2016-05-10 DIAGNOSIS — G309 Alzheimer's disease, unspecified: Secondary | ICD-10-CM

## 2016-05-10 DIAGNOSIS — F0280 Dementia in other diseases classified elsewhere without behavioral disturbance: Secondary | ICD-10-CM

## 2016-05-10 DIAGNOSIS — J189 Pneumonia, unspecified organism: Secondary | ICD-10-CM | POA: Insufficient documentation

## 2016-05-10 DIAGNOSIS — I10 Essential (primary) hypertension: Secondary | ICD-10-CM | POA: Diagnosis not present

## 2016-05-10 DIAGNOSIS — R2689 Other abnormalities of gait and mobility: Secondary | ICD-10-CM | POA: Diagnosis not present

## 2016-05-10 DIAGNOSIS — J449 Chronic obstructive pulmonary disease, unspecified: Secondary | ICD-10-CM | POA: Diagnosis not present

## 2016-05-10 DIAGNOSIS — Z9981 Dependence on supplemental oxygen: Secondary | ICD-10-CM | POA: Diagnosis not present

## 2016-05-10 NOTE — Progress Notes (Signed)
Patient: KENTREL MAYLE Male    DOB: Feb 08, 1929   82 y.o.   MRN: [id number] Visit Date: 05/10/2016  Today's Provider: Lelon Huh, MD   Chief Complaint  Patient presents with  . Hospitalization Follow-up   Subjective:    HPI  Follow up Hospitalization  Patient was admitted to [facility] on 04/25/2016 and discharged on 04/27/2016. He was treated for sepsis and pneumonia. Treatment for this included IV Zosyn. Then later changed to oral liquid Augmentin. He reports good compliance with treatment. He reports this condition is Improved. He was brought in by his daughter today who reports he is back to his baseline.         Allergies  Allergen Reactions  . Haldol [Haloperidol Lactate] Other (See Comments)    hallucinations  . Vancomycin Hcl Rash     Current Outpatient Prescriptions:  .  acetaminophen (TYLENOL) 325 MG tablet, Take 2 tablets by mouth every 4 (four) hours as needed. for mild pain or fever, Disp: , Rfl:  .  aspirin 81 MG EC tablet, Take 1 tablet by mouth daily., Disp: , Rfl:  .  bisacodyl (FLEET) 10 MG/30ML ENEM, Place 10 mg rectally once., Disp: , Rfl:  .  Dextromethorphan-Guaifenesin (ROBAFEN DM) 10-100 MG/5ML liquid, Take 5 mLs by mouth every 4 (four) hours as needed (cough)., Disp: , Rfl:  .  donepezil (ARICEPT) 10 MG tablet, Take 1 tablet by mouth at bedtime., Disp: , Rfl:  .  ipratropium-albuterol (DUONEB) 0.5-2.5 (3) MG/3ML SOLN, Take 3 mLs by nebulization 2 (two) times daily., Disp: , Rfl:  .  lovastatin (MEVACOR) 20 MG tablet, Take 1 tablet by mouth daily., Disp: , Rfl:  .  memantine (NAMENDA) 10 MG tablet, Take 1 tablet by mouth 2 (two) times daily., Disp: , Rfl:  .  oxyCODONE (OXY IR/ROXICODONE) 5 MG immediate release tablet, Take 1 tablet (5 mg total) by mouth every 4 (four) hours as needed for moderate pain., Disp: 60 tablet, Rfl: 0 .  polyethylene glycol (MIRALAX / GLYCOLAX) packet, Take 17 g by mouth every other day. , Disp: , Rfl:  .   sennosides-docusate sodium (SENOKOT-S) 8.6-50 MG tablet, Take 2 tablets by mouth 2 (two) times daily. , Disp: , Rfl:  .  Travoprost, BAK Free, (TRAVATAN) 0.004 % SOLN ophthalmic solution, Apply 1 drop to eye at bedtime., Disp: , Rfl:  .  amoxicillin-clavulanate (AUGMENTIN) 600-42.9 MG/5ML suspension, Take 5 mLs (600 mg total) by mouth 2 (two) times daily. For 7 days (Patient not taking: Reported on 05/10/2016), Disp: 200 mL, Rfl: 0  Review of Systems  Constitutional: Negative for activity change, chills, diaphoresis, fatigue and fever.  HENT: Negative.   Respiratory: Negative for cough, shortness of breath and wheezing.   Cardiovascular: Negative.     Social History  Substance Use Topics  . Smoking status: Former Smoker    Packs/day: 1.00    Years: 40.00    Types: Cigarettes    Quit date: 05/16/1988  . Smokeless tobacco: Not on file  . Alcohol use No   Objective:   BP 132/60 (BP Location: Left Arm, Patient Position: Sitting, Cuff Size: Normal)   Pulse 72   Temp 98.5 F (36.9 C)   Resp 16   SpO2 96% Comment: with 2L of O2  Physical Exam   General Appearance:    Alert, cooperative, no distress  Eyes:    PERRL, conjunctiva/corneas clear, EOM's intact       Lungs:  Clear to auscultation bilaterally, respirations unlabored  Heart:    Regular rate and rhythm  Neurologic:   Awake, alert. Does not speak also responds when spoken to.            Assessment & Plan:     1. Alzheimer's dementia without behavioral disturbance, unspecified timing of dementia onset Back to baseline. Continue [facility] care at [facility]  2. Chronic obstructive pulmonary disease, unspecified COPD type ([place]) Stable, continue current plan of care, inhalers and oxygen.   3. Pneumonia due to infectious organism, unspecified laterality, unspecified part of lung Clinically resolved. Call if any symptoms develop.        Lelon Huh, MD  [facility]

## 2016-05-11 ENCOUNTER — Inpatient Hospital Stay: Payer: Medicare Other | Admitting: Family Medicine

## 2016-05-11 ENCOUNTER — Other Ambulatory Visit: Payer: Self-pay

## 2016-05-11 MED ORDER — OXYCODONE HCL 5 MG PO TABS: 5.0000 mg | ORAL_TABLET | ORAL | 0 refills | Status: AC | PRN

## 2016-05-11 NOTE — Telephone Encounter (Signed)
Pharmacy requesting refills. Pharmacy listed is correct.

## 2016-05-12 DIAGNOSIS — G309 Alzheimer's disease, unspecified: Secondary | ICD-10-CM | POA: Diagnosis not present

## 2016-05-12 DIAGNOSIS — Z9981 Dependence on supplemental oxygen: Secondary | ICD-10-CM | POA: Diagnosis not present

## 2016-05-12 DIAGNOSIS — J449 Chronic obstructive pulmonary disease, unspecified: Secondary | ICD-10-CM | POA: Diagnosis not present

## 2016-05-12 DIAGNOSIS — R2689 Other abnormalities of gait and mobility: Secondary | ICD-10-CM | POA: Diagnosis not present

## 2016-05-12 DIAGNOSIS — F0280 Dementia in other diseases classified elsewhere without behavioral disturbance: Secondary | ICD-10-CM | POA: Diagnosis not present

## 2016-05-12 DIAGNOSIS — I10 Essential (primary) hypertension: Secondary | ICD-10-CM | POA: Diagnosis not present

## 2016-05-17 DIAGNOSIS — F0280 Dementia in other diseases classified elsewhere without behavioral disturbance: Secondary | ICD-10-CM | POA: Diagnosis not present

## 2016-05-17 DIAGNOSIS — Z9981 Dependence on supplemental oxygen: Secondary | ICD-10-CM | POA: Diagnosis not present

## 2016-05-17 DIAGNOSIS — R2689 Other abnormalities of gait and mobility: Secondary | ICD-10-CM | POA: Diagnosis not present

## 2016-05-17 DIAGNOSIS — J449 Chronic obstructive pulmonary disease, unspecified: Secondary | ICD-10-CM | POA: Diagnosis not present

## 2016-05-17 DIAGNOSIS — G309 Alzheimer's disease, unspecified: Secondary | ICD-10-CM | POA: Diagnosis not present

## 2016-05-17 DIAGNOSIS — I10 Essential (primary) hypertension: Secondary | ICD-10-CM | POA: Diagnosis not present

## 2016-05-19 DIAGNOSIS — J449 Chronic obstructive pulmonary disease, unspecified: Secondary | ICD-10-CM | POA: Diagnosis not present

## 2016-05-19 DIAGNOSIS — Z9981 Dependence on supplemental oxygen: Secondary | ICD-10-CM | POA: Diagnosis not present

## 2016-05-19 DIAGNOSIS — I10 Essential (primary) hypertension: Secondary | ICD-10-CM | POA: Diagnosis not present

## 2016-05-19 DIAGNOSIS — F0280 Dementia in other diseases classified elsewhere without behavioral disturbance: Secondary | ICD-10-CM | POA: Diagnosis not present

## 2016-05-19 DIAGNOSIS — R2689 Other abnormalities of gait and mobility: Secondary | ICD-10-CM | POA: Diagnosis not present

## 2016-05-19 DIAGNOSIS — G309 Alzheimer's disease, unspecified: Secondary | ICD-10-CM | POA: Diagnosis not present

## 2016-05-23 DIAGNOSIS — J449 Chronic obstructive pulmonary disease, unspecified: Secondary | ICD-10-CM | POA: Diagnosis not present

## 2016-05-23 DIAGNOSIS — F0280 Dementia in other diseases classified elsewhere without behavioral disturbance: Secondary | ICD-10-CM | POA: Diagnosis not present

## 2016-05-23 DIAGNOSIS — R2689 Other abnormalities of gait and mobility: Secondary | ICD-10-CM | POA: Diagnosis not present

## 2016-05-23 DIAGNOSIS — Z9981 Dependence on supplemental oxygen: Secondary | ICD-10-CM | POA: Diagnosis not present

## 2016-05-23 DIAGNOSIS — I10 Essential (primary) hypertension: Secondary | ICD-10-CM | POA: Diagnosis not present

## 2016-05-23 DIAGNOSIS — G309 Alzheimer's disease, unspecified: Secondary | ICD-10-CM | POA: Diagnosis not present

## 2016-05-25 DIAGNOSIS — F0280 Dementia in other diseases classified elsewhere without behavioral disturbance: Secondary | ICD-10-CM | POA: Diagnosis not present

## 2016-05-25 DIAGNOSIS — R2689 Other abnormalities of gait and mobility: Secondary | ICD-10-CM | POA: Diagnosis not present

## 2016-05-25 DIAGNOSIS — G309 Alzheimer's disease, unspecified: Secondary | ICD-10-CM | POA: Diagnosis not present

## 2016-05-25 DIAGNOSIS — Z9981 Dependence on supplemental oxygen: Secondary | ICD-10-CM | POA: Diagnosis not present

## 2016-05-25 DIAGNOSIS — J449 Chronic obstructive pulmonary disease, unspecified: Secondary | ICD-10-CM | POA: Diagnosis not present

## 2016-05-25 DIAGNOSIS — I10 Essential (primary) hypertension: Secondary | ICD-10-CM | POA: Diagnosis not present

## 2016-05-31 ENCOUNTER — Telehealth: Payer: Self-pay | Admitting: Family Medicine

## 2016-05-31 DIAGNOSIS — I10 Essential (primary) hypertension: Secondary | ICD-10-CM | POA: Diagnosis not present

## 2016-05-31 DIAGNOSIS — F0280 Dementia in other diseases classified elsewhere without behavioral disturbance: Secondary | ICD-10-CM | POA: Diagnosis not present

## 2016-05-31 DIAGNOSIS — Z9981 Dependence on supplemental oxygen: Secondary | ICD-10-CM | POA: Diagnosis not present

## 2016-05-31 DIAGNOSIS — G309 Alzheimer's disease, unspecified: Secondary | ICD-10-CM | POA: Diagnosis not present

## 2016-05-31 DIAGNOSIS — R2689 Other abnormalities of gait and mobility: Secondary | ICD-10-CM | POA: Diagnosis not present

## 2016-05-31 DIAGNOSIS — J449 Chronic obstructive pulmonary disease, unspecified: Secondary | ICD-10-CM | POA: Diagnosis not present

## 2016-05-31 NOTE — Telephone Encounter (Signed)
Adam Singleton with Adam Singleton stated the pt's family have expressed that they are concerned that pt might be need to have his oxyCODONE (OXY IR/ROXICODONE) 5 MG immediate release tablet changed to tylenol or something they are concerned that since pt has been on the medication for a year he might become to depend on it. Please advise. Thanks TNP

## 2016-06-07 DIAGNOSIS — G309 Alzheimer's disease, unspecified: Secondary | ICD-10-CM | POA: Diagnosis not present

## 2016-06-07 DIAGNOSIS — R2689 Other abnormalities of gait and mobility: Secondary | ICD-10-CM | POA: Diagnosis not present

## 2016-06-07 DIAGNOSIS — I10 Essential (primary) hypertension: Secondary | ICD-10-CM | POA: Diagnosis not present

## 2016-06-07 DIAGNOSIS — F0280 Dementia in other diseases classified elsewhere without behavioral disturbance: Secondary | ICD-10-CM | POA: Diagnosis not present

## 2016-06-07 DIAGNOSIS — Z9981 Dependence on supplemental oxygen: Secondary | ICD-10-CM | POA: Diagnosis not present

## 2016-06-07 DIAGNOSIS — J449 Chronic obstructive pulmonary disease, unspecified: Secondary | ICD-10-CM | POA: Diagnosis not present

## 2016-06-08 NOTE — Telephone Encounter (Signed)
Returned call to [place]. [name] wanted [place] to know that patient's family believe pt is becoming addicted to oxycodone 5 mg. The family wants to have oxycodone discontinued and pt be given something else to help with his pain. Please advise?

## 2016-06-08 NOTE — Telephone Encounter (Signed)
OK to discontinue oxycodone. And start tylenol 325mg  two tablets Q4 hours prn pain. Call if this is not effective.

## 2016-06-08 NOTE — Telephone Encounter (Signed)
[name] with [facility] request a call back.  [phone]

## 2016-06-09 ENCOUNTER — Telehealth: Payer: Self-pay | Admitting: Family Medicine

## 2016-06-09 DIAGNOSIS — Z9981 Dependence on supplemental oxygen: Secondary | ICD-10-CM | POA: Diagnosis not present

## 2016-06-09 DIAGNOSIS — F0280 Dementia in other diseases classified elsewhere without behavioral disturbance: Secondary | ICD-10-CM | POA: Diagnosis not present

## 2016-06-09 DIAGNOSIS — J449 Chronic obstructive pulmonary disease, unspecified: Secondary | ICD-10-CM | POA: Diagnosis not present

## 2016-06-09 DIAGNOSIS — I10 Essential (primary) hypertension: Secondary | ICD-10-CM | POA: Diagnosis not present

## 2016-06-09 DIAGNOSIS — R2689 Other abnormalities of gait and mobility: Secondary | ICD-10-CM | POA: Diagnosis not present

## 2016-06-09 DIAGNOSIS — G309 Alzheimer's disease, unspecified: Secondary | ICD-10-CM | POA: Diagnosis not present

## 2016-06-09 NOTE — Telephone Encounter (Signed)
Pt's caregiver states they need a written order for depends.  [place] states the pharmacy [facility]) is requiring an order for this now.  Please fax to [phone]

## 2016-06-09 NOTE — Telephone Encounter (Signed)
Please advise 

## 2016-06-09 NOTE — Telephone Encounter (Signed)
Order written and faxed to [facility] [phone].

## 2016-06-15 DIAGNOSIS — I10 Essential (primary) hypertension: Secondary | ICD-10-CM | POA: Diagnosis not present

## 2016-06-15 DIAGNOSIS — J449 Chronic obstructive pulmonary disease, unspecified: Secondary | ICD-10-CM | POA: Diagnosis not present

## 2016-06-15 DIAGNOSIS — Z9981 Dependence on supplemental oxygen: Secondary | ICD-10-CM | POA: Diagnosis not present

## 2016-06-15 DIAGNOSIS — G309 Alzheimer's disease, unspecified: Secondary | ICD-10-CM | POA: Diagnosis not present

## 2016-06-15 DIAGNOSIS — F0280 Dementia in other diseases classified elsewhere without behavioral disturbance: Secondary | ICD-10-CM | POA: Diagnosis not present

## 2016-06-15 DIAGNOSIS — R2689 Other abnormalities of gait and mobility: Secondary | ICD-10-CM | POA: Diagnosis not present

## 2016-06-15 NOTE — Telephone Encounter (Signed)
Order written and faxed to the number below.

## 2016-06-15 NOTE — Telephone Encounter (Signed)
Adam Singleton called to see if the orders for depends had been sent. Please advise. Thanks TNP

## 2016-09-29 ENCOUNTER — Telehealth: Payer: Self-pay | Admitting: Family Medicine

## 2016-09-29 NOTE — Telephone Encounter (Signed)
Spoke with [name], this needs to be written orders. Order written om rx pad and placed on Dr. Maralyn Sago MA's desk for him to sign tomorrow since he is out of office for the rest of the day. Fax number below is correct. Renaldo Fiddler, CMA

## 2016-09-29 NOTE — Telephone Encounter (Signed)
[facility] for order? Renaldo Fiddler, CMA

## 2016-09-29 NOTE — Telephone Encounter (Signed)
[name] at [facility] called wanting to get an order for PT on Adam Singleton.  To eval and treat gait.  Their call [phone]  Fax is [phone]  Thanks teri

## 2016-09-29 NOTE — Telephone Encounter (Signed)
OK 

## 2016-09-30 NOTE — Telephone Encounter (Signed)
Order signed and faxed.

## 2016-09-30 NOTE — Telephone Encounter (Signed)
Waiting on signature-aa

## 2016-10-04 DIAGNOSIS — R293 Abnormal posture: Secondary | ICD-10-CM | POA: Diagnosis not present

## 2016-10-04 DIAGNOSIS — R262 Difficulty in walking, not elsewhere classified: Secondary | ICD-10-CM | POA: Diagnosis not present

## 2016-10-04 DIAGNOSIS — M6281 Muscle weakness (generalized): Secondary | ICD-10-CM | POA: Diagnosis not present

## 2016-10-07 DIAGNOSIS — R262 Difficulty in walking, not elsewhere classified: Secondary | ICD-10-CM | POA: Diagnosis not present

## 2016-10-07 DIAGNOSIS — R293 Abnormal posture: Secondary | ICD-10-CM | POA: Diagnosis not present

## 2016-10-07 DIAGNOSIS — M6281 Muscle weakness (generalized): Secondary | ICD-10-CM | POA: Diagnosis not present

## 2016-10-12 DIAGNOSIS — R262 Difficulty in walking, not elsewhere classified: Secondary | ICD-10-CM | POA: Diagnosis not present

## 2016-10-12 DIAGNOSIS — M6281 Muscle weakness (generalized): Secondary | ICD-10-CM | POA: Diagnosis not present

## 2016-10-12 DIAGNOSIS — R293 Abnormal posture: Secondary | ICD-10-CM | POA: Diagnosis not present

## 2016-10-14 ENCOUNTER — Ambulatory Visit: Payer: Medicare Other | Admitting: Family Medicine

## 2016-10-20 DIAGNOSIS — M6281 Muscle weakness (generalized): Secondary | ICD-10-CM | POA: Diagnosis not present

## 2016-10-20 DIAGNOSIS — R262 Difficulty in walking, not elsewhere classified: Secondary | ICD-10-CM | POA: Diagnosis not present

## 2016-10-20 DIAGNOSIS — R293 Abnormal posture: Secondary | ICD-10-CM | POA: Diagnosis not present

## 2016-10-21 ENCOUNTER — Ambulatory Visit: Payer: Medicare Other | Admitting: Family Medicine

## 2016-10-24 DIAGNOSIS — M6281 Muscle weakness (generalized): Secondary | ICD-10-CM | POA: Diagnosis not present

## 2016-10-24 DIAGNOSIS — R293 Abnormal posture: Secondary | ICD-10-CM | POA: Diagnosis not present

## 2016-10-24 DIAGNOSIS — R262 Difficulty in walking, not elsewhere classified: Secondary | ICD-10-CM | POA: Diagnosis not present

## 2016-10-25 ENCOUNTER — Ambulatory Visit: Payer: Medicare Other | Admitting: Family Medicine

## 2016-10-25 DIAGNOSIS — R293 Abnormal posture: Secondary | ICD-10-CM | POA: Diagnosis not present

## 2016-10-25 DIAGNOSIS — M6281 Muscle weakness (generalized): Secondary | ICD-10-CM | POA: Diagnosis not present

## 2016-10-25 DIAGNOSIS — R262 Difficulty in walking, not elsewhere classified: Secondary | ICD-10-CM | POA: Diagnosis not present

## 2016-10-27 DIAGNOSIS — M6281 Muscle weakness (generalized): Secondary | ICD-10-CM | POA: Diagnosis not present

## 2016-10-27 DIAGNOSIS — R293 Abnormal posture: Secondary | ICD-10-CM | POA: Diagnosis not present

## 2016-10-27 DIAGNOSIS — R262 Difficulty in walking, not elsewhere classified: Secondary | ICD-10-CM | POA: Diagnosis not present

## 2016-11-01 ENCOUNTER — Ambulatory Visit (INDEPENDENT_AMBULATORY_CARE_PROVIDER_SITE_OTHER): Payer: Medicare Other | Admitting: Family Medicine

## 2016-11-01 ENCOUNTER — Encounter: Payer: Self-pay | Admitting: Family Medicine

## 2016-11-01 VITALS — BP 126/70 | HR 72 | Resp 12

## 2016-11-01 DIAGNOSIS — J449 Chronic obstructive pulmonary disease, unspecified: Secondary | ICD-10-CM | POA: Diagnosis not present

## 2016-11-01 DIAGNOSIS — F0280 Dementia in other diseases classified elsewhere without behavioral disturbance: Secondary | ICD-10-CM | POA: Diagnosis not present

## 2016-11-01 DIAGNOSIS — G309 Alzheimer's disease, unspecified: Secondary | ICD-10-CM

## 2016-11-01 MED ORDER — CIPROFLOXACIN HCL 500 MG PO TABS: 500.0000 mg | ORAL_TABLET | Freq: Two times a day (BID) | ORAL | 0 refills | Status: AC

## 2016-11-01 NOTE — Progress Notes (Signed)
Patient: Adam Singleton Male    DOB: November 03, 1928   83 y.o.   MRN: [id number] Visit Date: 11/01/2016  Today's Provider: Lelon Huh, MD   Chief Complaint  Patient presents with  . Hyperglycemia  . COPD  . Dementia   Subjective:    HPI  Hyperglycemia, Follow-up:   Lab Results  Component Value Date   HGBA1C 5.7 (H) 04/15/2016   HGBA1C 6.0 (H) 11/05/2014   GLUCOSE 93 04/27/2016   GLUCOSE 115 (H) 04/25/2016   GLUCOSE 125 (H) 04/15/2016    Last seen for for this 6 months ago.  Management since then includes no changes. Current symptoms include none and have been stable.  Weight trend: stable Prior visit with dietician: no Current diet: well balanced Current exercise: none  Pertinent Labs:    Component Value Date/Time   CHOL 157 05/30/2014   TRIG 87 05/30/2014   CREATININE 0.50 (L) 04/27/2016 0423   CREATININE 0.90 12/24/2013 0618    Wt Readings from Last 3 Encounters:  04/25/16 147 lb 12.8 oz (67 kg)  06/26/15 161 lb (73 kg)  07/08/14 167 lb (75.8 kg)    COPD, follow up: Patient was last seen in the office 6 months ago. No changes were made since his last visit.     Alzheimer's' dementia, follow up: Patient was last seen in the office 6 months ago. No changes were made. His daughter reports that over the last week he has started slumping over in his wheelchair, and has stayed in the position persistently the last couple of days. He has not acted like hie is uncomfortable. She states he is still eating. She does not want any extensive testing done. He is is currently residing at [facility] but she reports staff is having trouble transferring him to and from his bed.   Allergies  Allergen Reactions  . Haldol [Haloperidol Lactate] Other (See Comments)    hallucinations  . Vancomycin Hcl Rash     Current Outpatient Prescriptions:  .  acetaminophen (TYLENOL) 325 MG tablet, Take 2 tablets by mouth every 4 (four) hours as needed. for mild pain or fever,  Disp: , Rfl:  .  aspirin 81 MG EC tablet, Take 1 tablet by mouth daily., Disp: , Rfl:  .  bisacodyl (FLEET) 10 MG/30ML ENEM, Place 10 mg rectally once., Disp: , Rfl:  .  Dextromethorphan-Guaifenesin (ROBAFEN DM) 10-100 MG/5ML liquid, Take 5 mLs by mouth every 4 (four) hours as needed (cough)., Disp: , Rfl:  .  donepezil (ARICEPT) 10 MG tablet, Take 1 tablet by mouth at bedtime., Disp: , Rfl:  .  ipratropium-albuterol (DUONEB) 0.5-2.5 (3) MG/3ML SOLN, Take 3 mLs by nebulization 2 (two) times daily., Disp: , Rfl:  .  lovastatin (MEVACOR) 20 MG tablet, Take 1 tablet by mouth daily., Disp: , Rfl:  .  memantine (NAMENDA) 10 MG tablet, Take 1 tablet by mouth 2 (two) times daily., Disp: , Rfl:  .  oxyCODONE (OXY IR/ROXICODONE) 5 MG immediate release tablet, Take 1 tablet (5 mg total) by mouth every 4 (four) hours as needed for moderate pain., Disp: 60 tablet, Rfl: 0 .  polyethylene glycol (MIRALAX / GLYCOLAX) packet, Take 17 g by mouth every other day. , Disp: , Rfl:  .  sennosides-docusate sodium (SENOKOT-S) 8.6-50 MG tablet, Take 2 tablets by mouth 2 (two) times daily. , Disp: , Rfl:  .  Travoprost, BAK Free, (TRAVATAN) 0.004 % SOLN ophthalmic solution, Apply 1 drop to eye  at bedtime., Disp: , Rfl:   Review of Systems  Constitutional: Negative.   Respiratory: Positive for shortness of breath.        Has COPD  Cardiovascular: Negative for chest pain, palpitations and leg swelling.  Musculoskeletal: Positive for arthralgias and gait problem.    Social History  Substance Use Topics  . Smoking status: Former Smoker    Packs/day: 1.00    Years: 40.00    Types: Cigarettes    Quit date: 05/16/1988  . Smokeless tobacco: Never Used  . Alcohol use No   Objective:   BP 126/70 (BP Location: Right Arm, Patient Position: Sitting, Cuff Size: Normal)   Pulse 72   Resp 12   SpO2 95% Comment: with 2L of O2    Physical Exam   General Appearance:    Awake, slumped in wheel chair. In no distress.  Answers yes/no questions. Denies any pain, but is not clear whether he understands questions.   Eyes:    PERRL, conjunctiva/corneas clear, EOM's intact       Lungs:     Clear to auscultation bilaterally, respirations unlabored  Heart:    Regular rate and rhythm  Neurologic:   Awake, alert,  No apparent focal neurological           defect.           Assessment & Plan:     1. Chronic obstructive pulmonary disease, unspecified COPD type ([place]) stable  2. Alzheimer's dementia without behavioral disturbance, unspecified timing of dementia onset Significant decline over the last few weeks. Will likely need higher level of care or referral back to hospice. His daughter does not wish for any invasive testing including blood tests done, considering his advanced dementia. She is agreeable to try antibiotic to cover for possible infectious causes of recent decline such as possible UTI.        Lelon Huh, MD  [facility]

## 2016-11-02 ENCOUNTER — Encounter: Payer: Self-pay | Admitting: Emergency Medicine

## 2016-11-02 ENCOUNTER — Emergency Department: Payer: Medicare Other

## 2016-11-02 ENCOUNTER — Emergency Department
Admission: EM | Admit: 2016-11-02 | Discharge: 2016-11-02 | Disposition: A | Discharge status: Home / Self Care | Payer: Medicare Other | Attending: Emergency Medicine | Admitting: Emergency Medicine

## 2016-11-02 DIAGNOSIS — R5383 Other fatigue: Secondary | ICD-10-CM | POA: Diagnosis not present

## 2016-11-02 DIAGNOSIS — I69351 Hemiplegia and hemiparesis following cerebral infarction affecting right dominant side: Secondary | ICD-10-CM | POA: Diagnosis not present

## 2016-11-02 DIAGNOSIS — Z85820 Personal history of malignant melanoma of skin: Secondary | ICD-10-CM | POA: Diagnosis not present

## 2016-11-02 DIAGNOSIS — R531 Weakness: Secondary | ICD-10-CM | POA: Insufficient documentation

## 2016-11-02 DIAGNOSIS — I10 Essential (primary) hypertension: Secondary | ICD-10-CM | POA: Diagnosis not present

## 2016-11-02 DIAGNOSIS — J449 Chronic obstructive pulmonary disease, unspecified: Secondary | ICD-10-CM | POA: Insufficient documentation

## 2016-11-02 DIAGNOSIS — Z87891 Personal history of nicotine dependence: Secondary | ICD-10-CM | POA: Diagnosis not present

## 2016-11-02 DIAGNOSIS — N390 Urinary tract infection, site not specified: Secondary | ICD-10-CM | POA: Diagnosis not present

## 2016-11-02 LAB — URINALYSIS, COMPLETE (UACMP) WITH MICROSCOPIC
BACTERIA UA: NONE SEEN
Glucose, UA: NEGATIVE mg/dL
Leukocytes, UA: NEGATIVE
Nitrite: NEGATIVE
PH: 5.5 (ref 5.0–8.0)
Protein, ur: 30 mg/dL — AB

## 2016-11-02 LAB — COMPREHENSIVE METABOLIC PANEL
ALT: 27 U/L (ref 17–63)
AST: 31 U/L (ref 15–41)
Albumin: 3.2 g/dL — ABNORMAL LOW (ref 3.5–5.0)
Alkaline Phosphatase: 167 U/L — ABNORMAL HIGH (ref 38–126)
Anion gap: 6 (ref 5–15)
BILIRUBIN TOTAL: 0.8 mg/dL (ref 0.3–1.2)
BUN: 16 mg/dL (ref 6–20)
CO2: 28 mmol/L (ref 22–32)
CREATININE: 0.74 mg/dL (ref 0.61–1.24)
Calcium: 8.5 mg/dL — ABNORMAL LOW (ref 8.9–10.3)
Chloride: 111 mmol/L (ref 101–111)
GFR calc Af Amer: >60 mL/min (ref >60)
Glucose, Bld: 111 mg/dL — ABNORMAL HIGH (ref 65–99)
POTASSIUM: 3.2 mmol/L — AB (ref 3.5–5.1)
Sodium: 145 mmol/L (ref 135–145)
TOTAL PROTEIN: 7.2 g/dL (ref 6.5–8.1)

## 2016-11-02 LAB — TROPONIN I

## 2016-11-02 LAB — CBC WITH DIFFERENTIAL/PLATELET
BASOS ABS: 0.1 10*3/uL (ref 0–0.1)
Basophils Relative: 1 %
Eosinophils Absolute: 0.2 10*3/uL (ref 0–0.7)
Eosinophils Relative: 2 %
HEMATOCRIT: 42.5 % (ref 40.0–52.0)
Hemoglobin: 14 g/dL (ref 13.0–18.0)
LYMPHS ABS: 2.4 10*3/uL (ref 1.0–3.6)
LYMPHS PCT: 28 %
MCH: 30.3 pg (ref 26.0–34.0)
MCHC: 32.9 g/dL (ref 32.0–36.0)
MCV: 92.1 fL (ref 80.0–100.0)
MONO ABS: 0.6 10*3/uL (ref 0.2–1.0)
MONOS PCT: 7 %
NEUTROS ABS: 5.3 10*3/uL (ref 1.4–6.5)
Neutrophils Relative %: 62 %
Platelets: 140 10*3/uL — ABNORMAL LOW (ref 150–440)
RBC: 4.62 MIL/uL (ref 4.40–5.90)
RDW: 15.3 % — AB (ref 11.5–14.5)
WBC: 8.7 10*3/uL (ref 3.8–10.6)

## 2016-11-02 NOTE — ED Provider Notes (Signed)
[facility] Emergency Department Provider Note       Time seen: ----------------------------------------- 1:36 PM on 11/02/2016 -----------------------------------------  Level V caveat: History/ROS limited by altered mental status   I have reviewed the triage vital signs and the nursing notes.   HISTORY   Chief Complaint Weakness    HPI Adam Singleton is a 83 y.o. male who presents to the ED for reported weakness and possibly right-sided weakness specifically. It is unclear how long these symptoms have been going on. He cannot give any further review of systems or report. Son states that he has been leaning forward more than normal, was recently seen by his primary care doctor for same and started on antibiotics for possible urinary tract infection.   Past Medical History:  Diagnosis Date  . Alzheimer's disease 12/09/2009  . Cancer ([place])    malignant melanoma  . Clotting disorder (H[facility])    Thrombocytopenia  . Colon polyps   . COPD (chronic obstructive pulmonary disease) ([place])   . Hyperlipidemia   . Hypertension   . Tachycardia     Patient Active Problem List   Diagnosis Date Noted  . Pneumonia 05/10/2016  . Sepsis ([place]) 04/25/2016  . Pressure ulcer 06/27/2015  . Compression fracture of L2 ([place]) 06/26/2015  . Back pain 06/26/2015  . 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 Emphysema lung ([place]) 06/26/2015  . Abdominal aortic aneurysm (AAA) ([place]) 06/26/2015  . Hyperglycemia 11/05/2014  . History of DVT (deep vein thrombosis) 11/05/2014  . 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 Personal history of PE (pulmonary embolism) 11/05/2014  . Obstruction to urinary outflow 11/05/2014  . History of colonic polyps 09/24/2014  . COPD (chronic obstructive pulmonary disease) ([place]) 09/24/2014  . H/O cardiovascular disorder 09/24/2014  . H/O Malignant melanoma 09/24/2014  . Prostate disease 09/24/2014  . Thrombocytopenia ([place]) 09/24/2014  . Alzheimer's dementia  12/09/2009  . Hyperlipidemia 12/09/2009  . Malignant neoplasm of scalp and skin of neck 11/09/2009    Past Surgical History:  Procedure Laterality Date  . KYPHOPLASTY N/A 06/29/2015   Procedure: KYPHOPLASTY L2;  Surgeon: Hessie Knows, MD;  Location: [facility] ORS;  Service: Orthopedics;  Laterality: N/A;  . MOHS SURGERY  12/07/2009   Moh's micrographic surgery for skin cancer    Allergies Haldol [haloperidol lactate] and Vancomycin hcl  Social History Social History  Substance Use Topics  . Smoking status: Former Smoker    Packs/day: 1.00    Years: 40.00    Types: Cigarettes    Quit date: 05/16/1988  . Smokeless tobacco: Never Used  . Alcohol use No    Review of Systems Unknown, positive for reported weakness  All systems negative/normal/unremarkable except as stated in the HPI  ____________________________________________   PHYSICAL EXAM:  VITAL SIGNS: ED Triage Vitals  Enc Vitals Group     BP 11/02/16 1330 138/68     Pulse Rate 11/02/16 1330 65     Resp 11/02/16 1330 15     Temp 11/02/16 1330 98 F (36.7 C)     Temp Source 11/02/16 1330 Axillary     SpO2 11/02/16 1330 97 %     Weight 11/02/16 1332 133 lb (60.3 kg)     Height 11/02/16 1332 5\' 7"  (1.702 m)     Head Circumference --      Peak Flow --      Pain Score --      Pain Loc --      Pain Edu? --  Excl. in [place]? --     Constitutional: Alert But disoriented, no acute distress Eyes: Conjunctivae are normal. Normal extraocular movements. ENT   Head: Normocephalic and atraumatic.   Nose: No congestion/rhinnorhea.   Mouth/Throat: Mucous membranes are moist.   Neck: No stridor. Cardiovascular: Normal rate, regular rhythm. No murmurs, rubs, or gallops. Respiratory: Normal respiratory effort without tachypnea nor retractions. Breath sounds are clear and equal bilaterally. No wheezes/rales/rhonchi. Gastrointestinal: Soft and nontender. Normal bowel sounds Musculoskeletal: Decreased range of  motion of his extremities, no obvious tenderness, mild lower extremity edema Neurologic:  Patient is lethargic, intermittent non-focal weakness, Cranial nerves appear to be intact Skin:  Skin is warm, dry and intact. No rash noted. Psychiatric: Flat mood and affect ____________________________________________  EKG: Interpreted by me. Sinus rhythm with baseline artifact, rate is 74 bpm, normal QRS, normal QT.  ____________________________________________  ED COURSE:  Pertinent labs & imaging results that were available during my care of the patient were reviewed by me and considered in my medical decision making (see chart for details). Patient presents for weakness, we will assess with labs and imaging as indicated.   Procedures ____________________________________________   LABS (pertinent positives/negatives)  Labs Reviewed  CBC WITH DIFFERENTIAL/PLATELET - Abnormal; Notable for the following:       Result Value   RDW 15.3 (*)    Platelets 140 (*)    All other components within normal limits  COMPREHENSIVE METABOLIC PANEL - Abnormal; Notable for the following:    Potassium 3.2 (*)    Glucose, Bld 111 (*)    Calcium 8.5 (*)    Albumin 3.2 (*)    Alkaline Phosphatase 167 (*)    All other components within normal limits  TROPONIN I  URINALYSIS, COMPLETE (UACMP) WITH MICROSCOPIC    RADIOLOGY Images were viewed by me  CT head IMPRESSION: No acute intracranial abnormality.  Atrophy, chronic microvascular disease. ____________________________________________  FINAL ASSESSMENT AND PLAN  Weakness  Plan: Patient's labs and imaging were dictated above. Patient had presented for weakness which is uncertain in etiology. Patient has dementia which may be progressing in severity. Final disposition is pending at this time.   Earleen Newport, MD   Note: This note was generated in part or whole with voice recognition software. Voice recognition is usually quite  accurate but there are transcription errors that can and very often do occur. I apologize for any typographical errors that were not detected and corrected.     Earleen Newport, MD 11/02/16 [phone]

## 2016-11-02 NOTE — ED Notes (Signed)
This RN contacted [facility], spoke w/ Pamala Hurry from [employer]. Reviewed pts discharge instructions w/ facility staff, staff verbalized understanding

## 2016-11-02 NOTE — ED Notes (Signed)
Attempted to in/out pt, unable to advance paste prostate, catheter continued to curl up.  Will have a second RN attempt.

## 2016-11-02 NOTE — ED Triage Notes (Signed)
Pt in via EMS from [facility], per EMS pt with right sided weakness x approximately 3 days.  Pt with advanced dementia, unable to follow commands at this time.  Pt responding to verbal stimuli upon arrival, but has since quit responding to to verbal/painful stimuli.  MD notified and to bedside at this time.  Vitals WDL.

## 2016-11-02 NOTE — ED Provider Notes (Signed)
-----------------------------------------   3:49 PM on 11/02/2016 -----------------------------------------  Patient's urinalysis is equivocal for possible urinary tract infection. I have added on a urine culture. Patient was started on antibiotics yesterday he will continue them. Son is agreeable to the plan of care   Harvest Dark, MD 11/02/16 1550

## 2016-11-02 NOTE — ED Notes (Signed)
Patient transported to CT 

## 2016-11-02 NOTE — ED Notes (Signed)
Pts family at bedside.  Pts IV removed.  Discharge instructions reviewed w/ pts son, pts son verbalized understanding.  Pts son contacted transport service, stated that [facility] transport would arrive between 1700-1725.

## 2016-11-03 DIAGNOSIS — R262 Difficulty in walking, not elsewhere classified: Secondary | ICD-10-CM | POA: Diagnosis not present

## 2016-11-03 DIAGNOSIS — R293 Abnormal posture: Secondary | ICD-10-CM | POA: Diagnosis not present

## 2016-11-03 DIAGNOSIS — M6281 Muscle weakness (generalized): Secondary | ICD-10-CM | POA: Diagnosis not present

## 2016-11-03 LAB — URINE CULTURE: CULTURE: NO GROWTH

## 2016-11-07 DIAGNOSIS — R293 Abnormal posture: Secondary | ICD-10-CM | POA: Diagnosis not present

## 2016-11-07 DIAGNOSIS — M6281 Muscle weakness (generalized): Secondary | ICD-10-CM | POA: Diagnosis not present

## 2016-11-07 DIAGNOSIS — R262 Difficulty in walking, not elsewhere classified: Secondary | ICD-10-CM | POA: Diagnosis not present

## 2016-11-11 ENCOUNTER — Telehealth: Payer: Self-pay | Admitting: Family Medicine

## 2016-11-11 DIAGNOSIS — M6281 Muscle weakness (generalized): Secondary | ICD-10-CM | POA: Diagnosis not present

## 2016-11-11 DIAGNOSIS — R293 Abnormal posture: Secondary | ICD-10-CM | POA: Diagnosis not present

## 2016-11-11 DIAGNOSIS — R262 Difficulty in walking, not elsewhere classified: Secondary | ICD-10-CM | POA: Diagnosis not present

## 2016-11-11 NOTE — Telephone Encounter (Signed)
FYI. KW 

## 2016-11-11 NOTE — Telephone Encounter (Signed)
[name] at [facility] request a new order for Adam Singleton for his oxygen. She is going to bring some new papers to be signed.    She just wanted to let us know she was coming by with papers for Adam Singleton to update his chart.  Thank s [name]

## 2016-11-17 ENCOUNTER — Telehealth: Payer: Self-pay | Admitting: Family Medicine

## 2016-11-17 NOTE — Telephone Encounter (Signed)
Please review-aa 

## 2016-11-17 NOTE — Telephone Encounter (Signed)
[name] with [facility] request a written order for pt to have 1 Ensure or Boost a day per pt is not eating as well as he has been in the past.  Fax [phone].  UG#[phone]/UE

## 2016-11-17 NOTE — Telephone Encounter (Signed)
ok 

## 2016-11-18 NOTE — Telephone Encounter (Signed)
Order written out, signed by Dr. Caryn Section and then faxed.

## 2016-11-28 ENCOUNTER — Telehealth: Payer: Self-pay | Admitting: Family Medicine

## 2016-11-28 NOTE — Telephone Encounter (Signed)
Order written, please fax

## 2016-11-28 NOTE — Telephone Encounter (Signed)
Adam Singleton with Adam Singleton is requesting a written order for a Hospice consult to evaluated for possible Hospice care.  Fax [phone].  VV#[phone]/YI

## 2016-11-28 NOTE — Telephone Encounter (Signed)
Please advise. Thanks.  

## 2016-11-28 NOTE — Telephone Encounter (Signed)
Order was faxed.

## 2016-11-28 NOTE — Telephone Encounter (Signed)
Order was refaxed

## 2016-11-28 NOTE — Telephone Encounter (Signed)
[name] at [facility] called saying the order she called for this morning was incorrect.  She needs it to say Palliative Care for hospice.  He is already on home care.  If you having any question please call her at [phone]  Thanks teri

## 2016-12-02 DIAGNOSIS — R293 Abnormal posture: Secondary | ICD-10-CM | POA: Diagnosis not present

## 2016-12-02 DIAGNOSIS — M6281 Muscle weakness (generalized): Secondary | ICD-10-CM | POA: Diagnosis not present

## 2016-12-02 DIAGNOSIS — R262 Difficulty in walking, not elsewhere classified: Secondary | ICD-10-CM | POA: Diagnosis not present

## 2016-12-05 DIAGNOSIS — J449 Chronic obstructive pulmonary disease, unspecified: Secondary | ICD-10-CM | POA: Diagnosis not present

## 2016-12-05 DIAGNOSIS — Z85820 Personal history of malignant melanoma of skin: Secondary | ICD-10-CM | POA: Diagnosis not present

## 2016-12-05 DIAGNOSIS — I10 Essential (primary) hypertension: Secondary | ICD-10-CM | POA: Diagnosis not present

## 2016-12-05 DIAGNOSIS — G309 Alzheimer's disease, unspecified: Secondary | ICD-10-CM | POA: Diagnosis not present

## 2016-12-05 DIAGNOSIS — Z9981 Dependence on supplemental oxygen: Secondary | ICD-10-CM | POA: Diagnosis not present

## 2016-12-05 DIAGNOSIS — F0280 Dementia in other diseases classified elsewhere without behavioral disturbance: Secondary | ICD-10-CM | POA: Diagnosis not present

## 2016-12-05 DIAGNOSIS — E785 Hyperlipidemia, unspecified: Secondary | ICD-10-CM | POA: Diagnosis not present

## 2016-12-05 DIAGNOSIS — H409 Unspecified glaucoma: Secondary | ICD-10-CM | POA: Diagnosis not present

## 2016-12-06 DIAGNOSIS — F0280 Dementia in other diseases classified elsewhere without behavioral disturbance: Secondary | ICD-10-CM | POA: Diagnosis not present

## 2016-12-06 DIAGNOSIS — Z9981 Dependence on supplemental oxygen: Secondary | ICD-10-CM | POA: Diagnosis not present

## 2016-12-06 DIAGNOSIS — J449 Chronic obstructive pulmonary disease, unspecified: Secondary | ICD-10-CM | POA: Diagnosis not present

## 2016-12-06 DIAGNOSIS — I10 Essential (primary) hypertension: Secondary | ICD-10-CM | POA: Diagnosis not present

## 2016-12-06 DIAGNOSIS — E785 Hyperlipidemia, unspecified: Secondary | ICD-10-CM | POA: Diagnosis not present

## 2016-12-06 DIAGNOSIS — G309 Alzheimer's disease, unspecified: Secondary | ICD-10-CM | POA: Diagnosis not present

## 2016-12-07 DIAGNOSIS — Z9981 Dependence on supplemental oxygen: Secondary | ICD-10-CM | POA: Diagnosis not present

## 2016-12-07 DIAGNOSIS — J449 Chronic obstructive pulmonary disease, unspecified: Secondary | ICD-10-CM | POA: Diagnosis not present

## 2016-12-07 DIAGNOSIS — I10 Essential (primary) hypertension: Secondary | ICD-10-CM | POA: Diagnosis not present

## 2016-12-07 DIAGNOSIS — G309 Alzheimer's disease, unspecified: Secondary | ICD-10-CM | POA: Diagnosis not present

## 2016-12-07 DIAGNOSIS — F0280 Dementia in other diseases classified elsewhere without behavioral disturbance: Secondary | ICD-10-CM | POA: Diagnosis not present

## 2016-12-07 DIAGNOSIS — E785 Hyperlipidemia, unspecified: Secondary | ICD-10-CM | POA: Diagnosis not present

## 2016-12-08 DIAGNOSIS — Z9981 Dependence on supplemental oxygen: Secondary | ICD-10-CM | POA: Diagnosis not present

## 2016-12-08 DIAGNOSIS — E785 Hyperlipidemia, unspecified: Secondary | ICD-10-CM | POA: Diagnosis not present

## 2016-12-08 DIAGNOSIS — G309 Alzheimer's disease, unspecified: Secondary | ICD-10-CM | POA: Diagnosis not present

## 2016-12-08 DIAGNOSIS — J449 Chronic obstructive pulmonary disease, unspecified: Secondary | ICD-10-CM | POA: Diagnosis not present

## 2016-12-08 DIAGNOSIS — F0280 Dementia in other diseases classified elsewhere without behavioral disturbance: Secondary | ICD-10-CM | POA: Diagnosis not present

## 2016-12-08 DIAGNOSIS — I10 Essential (primary) hypertension: Secondary | ICD-10-CM | POA: Diagnosis not present

## 2016-12-09 DIAGNOSIS — Z9981 Dependence on supplemental oxygen: Secondary | ICD-10-CM | POA: Diagnosis not present

## 2016-12-09 DIAGNOSIS — G309 Alzheimer's disease, unspecified: Secondary | ICD-10-CM | POA: Diagnosis not present

## 2016-12-09 DIAGNOSIS — F0280 Dementia in other diseases classified elsewhere without behavioral disturbance: Secondary | ICD-10-CM | POA: Diagnosis not present

## 2016-12-09 DIAGNOSIS — I10 Essential (primary) hypertension: Secondary | ICD-10-CM | POA: Diagnosis not present

## 2016-12-09 DIAGNOSIS — J449 Chronic obstructive pulmonary disease, unspecified: Secondary | ICD-10-CM | POA: Diagnosis not present

## 2016-12-09 DIAGNOSIS — E785 Hyperlipidemia, unspecified: Secondary | ICD-10-CM | POA: Diagnosis not present

## 2016-12-12 DIAGNOSIS — F0280 Dementia in other diseases classified elsewhere without behavioral disturbance: Secondary | ICD-10-CM | POA: Diagnosis not present

## 2016-12-12 DIAGNOSIS — Z9981 Dependence on supplemental oxygen: Secondary | ICD-10-CM | POA: Diagnosis not present

## 2016-12-12 DIAGNOSIS — I10 Essential (primary) hypertension: Secondary | ICD-10-CM | POA: Diagnosis not present

## 2016-12-12 DIAGNOSIS — E785 Hyperlipidemia, unspecified: Secondary | ICD-10-CM | POA: Diagnosis not present

## 2016-12-12 DIAGNOSIS — G309 Alzheimer's disease, unspecified: Secondary | ICD-10-CM | POA: Diagnosis not present

## 2016-12-12 DIAGNOSIS — J449 Chronic obstructive pulmonary disease, unspecified: Secondary | ICD-10-CM | POA: Diagnosis not present

## 2016-12-13 DIAGNOSIS — F0280 Dementia in other diseases classified elsewhere without behavioral disturbance: Secondary | ICD-10-CM | POA: Diagnosis not present

## 2016-12-13 DIAGNOSIS — G309 Alzheimer's disease, unspecified: Secondary | ICD-10-CM | POA: Diagnosis not present

## 2016-12-13 DIAGNOSIS — Z9981 Dependence on supplemental oxygen: Secondary | ICD-10-CM | POA: Diagnosis not present

## 2016-12-13 DIAGNOSIS — J449 Chronic obstructive pulmonary disease, unspecified: Secondary | ICD-10-CM | POA: Diagnosis not present

## 2016-12-13 DIAGNOSIS — E785 Hyperlipidemia, unspecified: Secondary | ICD-10-CM | POA: Diagnosis not present

## 2016-12-13 DIAGNOSIS — I10 Essential (primary) hypertension: Secondary | ICD-10-CM | POA: Diagnosis not present

## 2016-12-14 DIAGNOSIS — H409 Unspecified glaucoma: Secondary | ICD-10-CM | POA: Diagnosis not present

## 2016-12-14 DIAGNOSIS — Z9981 Dependence on supplemental oxygen: Secondary | ICD-10-CM | POA: Diagnosis not present

## 2016-12-14 DIAGNOSIS — I10 Essential (primary) hypertension: Secondary | ICD-10-CM | POA: Diagnosis not present

## 2016-12-14 DIAGNOSIS — F0280 Dementia in other diseases classified elsewhere without behavioral disturbance: Secondary | ICD-10-CM | POA: Diagnosis not present

## 2016-12-14 DIAGNOSIS — G309 Alzheimer's disease, unspecified: Secondary | ICD-10-CM | POA: Diagnosis not present

## 2016-12-14 DIAGNOSIS — E785 Hyperlipidemia, unspecified: Secondary | ICD-10-CM | POA: Diagnosis not present

## 2016-12-14 DIAGNOSIS — Z85820 Personal history of malignant melanoma of skin: Secondary | ICD-10-CM | POA: Diagnosis not present

## 2016-12-14 DIAGNOSIS — J449 Chronic obstructive pulmonary disease, unspecified: Secondary | ICD-10-CM | POA: Diagnosis not present

## 2016-12-15 DIAGNOSIS — G309 Alzheimer's disease, unspecified: Secondary | ICD-10-CM | POA: Diagnosis not present

## 2016-12-15 DIAGNOSIS — I10 Essential (primary) hypertension: Secondary | ICD-10-CM | POA: Diagnosis not present

## 2016-12-15 DIAGNOSIS — E785 Hyperlipidemia, unspecified: Secondary | ICD-10-CM | POA: Diagnosis not present

## 2016-12-15 DIAGNOSIS — F0280 Dementia in other diseases classified elsewhere without behavioral disturbance: Secondary | ICD-10-CM | POA: Diagnosis not present

## 2016-12-15 DIAGNOSIS — Z9981 Dependence on supplemental oxygen: Secondary | ICD-10-CM | POA: Diagnosis not present

## 2016-12-15 DIAGNOSIS — J449 Chronic obstructive pulmonary disease, unspecified: Secondary | ICD-10-CM | POA: Diagnosis not present

## 2016-12-16 DIAGNOSIS — F0280 Dementia in other diseases classified elsewhere without behavioral disturbance: Secondary | ICD-10-CM | POA: Diagnosis not present

## 2016-12-16 DIAGNOSIS — J449 Chronic obstructive pulmonary disease, unspecified: Secondary | ICD-10-CM | POA: Diagnosis not present

## 2016-12-16 DIAGNOSIS — I10 Essential (primary) hypertension: Secondary | ICD-10-CM | POA: Diagnosis not present

## 2016-12-16 DIAGNOSIS — Z9981 Dependence on supplemental oxygen: Secondary | ICD-10-CM | POA: Diagnosis not present

## 2016-12-16 DIAGNOSIS — G309 Alzheimer's disease, unspecified: Secondary | ICD-10-CM | POA: Diagnosis not present

## 2016-12-16 DIAGNOSIS — E785 Hyperlipidemia, unspecified: Secondary | ICD-10-CM | POA: Diagnosis not present

## 2016-12-19 DIAGNOSIS — F0280 Dementia in other diseases classified elsewhere without behavioral disturbance: Secondary | ICD-10-CM | POA: Diagnosis not present

## 2016-12-19 DIAGNOSIS — Z9981 Dependence on supplemental oxygen: Secondary | ICD-10-CM | POA: Diagnosis not present

## 2016-12-19 DIAGNOSIS — I10 Essential (primary) hypertension: Secondary | ICD-10-CM | POA: Diagnosis not present

## 2016-12-19 DIAGNOSIS — E785 Hyperlipidemia, unspecified: Secondary | ICD-10-CM | POA: Diagnosis not present

## 2016-12-19 DIAGNOSIS — G309 Alzheimer's disease, unspecified: Secondary | ICD-10-CM | POA: Diagnosis not present

## 2016-12-19 DIAGNOSIS — J449 Chronic obstructive pulmonary disease, unspecified: Secondary | ICD-10-CM | POA: Diagnosis not present

## 2016-12-20 DIAGNOSIS — J449 Chronic obstructive pulmonary disease, unspecified: Secondary | ICD-10-CM | POA: Diagnosis not present

## 2016-12-20 DIAGNOSIS — Z9981 Dependence on supplemental oxygen: Secondary | ICD-10-CM | POA: Diagnosis not present

## 2016-12-20 DIAGNOSIS — F0280 Dementia in other diseases classified elsewhere without behavioral disturbance: Secondary | ICD-10-CM | POA: Diagnosis not present

## 2016-12-20 DIAGNOSIS — G309 Alzheimer's disease, unspecified: Secondary | ICD-10-CM | POA: Diagnosis not present

## 2016-12-20 DIAGNOSIS — I10 Essential (primary) hypertension: Secondary | ICD-10-CM | POA: Diagnosis not present

## 2016-12-20 DIAGNOSIS — E785 Hyperlipidemia, unspecified: Secondary | ICD-10-CM | POA: Diagnosis not present

## 2016-12-21 DIAGNOSIS — G309 Alzheimer's disease, unspecified: Secondary | ICD-10-CM | POA: Diagnosis not present

## 2016-12-21 DIAGNOSIS — J449 Chronic obstructive pulmonary disease, unspecified: Secondary | ICD-10-CM | POA: Diagnosis not present

## 2016-12-21 DIAGNOSIS — E785 Hyperlipidemia, unspecified: Secondary | ICD-10-CM | POA: Diagnosis not present

## 2016-12-21 DIAGNOSIS — F0280 Dementia in other diseases classified elsewhere without behavioral disturbance: Secondary | ICD-10-CM | POA: Diagnosis not present

## 2016-12-21 DIAGNOSIS — Z9981 Dependence on supplemental oxygen: Secondary | ICD-10-CM | POA: Diagnosis not present

## 2016-12-21 DIAGNOSIS — I10 Essential (primary) hypertension: Secondary | ICD-10-CM | POA: Diagnosis not present

## 2016-12-22 DIAGNOSIS — F0280 Dementia in other diseases classified elsewhere without behavioral disturbance: Secondary | ICD-10-CM | POA: Diagnosis not present

## 2016-12-22 DIAGNOSIS — I10 Essential (primary) hypertension: Secondary | ICD-10-CM | POA: Diagnosis not present

## 2016-12-22 DIAGNOSIS — J449 Chronic obstructive pulmonary disease, unspecified: Secondary | ICD-10-CM | POA: Diagnosis not present

## 2016-12-22 DIAGNOSIS — Z9981 Dependence on supplemental oxygen: Secondary | ICD-10-CM | POA: Diagnosis not present

## 2016-12-22 DIAGNOSIS — E785 Hyperlipidemia, unspecified: Secondary | ICD-10-CM | POA: Diagnosis not present

## 2016-12-22 DIAGNOSIS — G309 Alzheimer's disease, unspecified: Secondary | ICD-10-CM | POA: Diagnosis not present

## 2016-12-23 DIAGNOSIS — F0280 Dementia in other diseases classified elsewhere without behavioral disturbance: Secondary | ICD-10-CM | POA: Diagnosis not present

## 2016-12-23 DIAGNOSIS — J449 Chronic obstructive pulmonary disease, unspecified: Secondary | ICD-10-CM | POA: Diagnosis not present

## 2016-12-23 DIAGNOSIS — G309 Alzheimer's disease, unspecified: Secondary | ICD-10-CM | POA: Diagnosis not present

## 2016-12-23 DIAGNOSIS — I10 Essential (primary) hypertension: Secondary | ICD-10-CM | POA: Diagnosis not present

## 2016-12-23 DIAGNOSIS — E785 Hyperlipidemia, unspecified: Secondary | ICD-10-CM | POA: Diagnosis not present

## 2016-12-23 DIAGNOSIS — Z9981 Dependence on supplemental oxygen: Secondary | ICD-10-CM | POA: Diagnosis not present

## 2016-12-26 DIAGNOSIS — G309 Alzheimer's disease, unspecified: Secondary | ICD-10-CM | POA: Diagnosis not present

## 2016-12-26 DIAGNOSIS — J449 Chronic obstructive pulmonary disease, unspecified: Secondary | ICD-10-CM | POA: Diagnosis not present

## 2016-12-26 DIAGNOSIS — I10 Essential (primary) hypertension: Secondary | ICD-10-CM | POA: Diagnosis not present

## 2016-12-26 DIAGNOSIS — E785 Hyperlipidemia, unspecified: Secondary | ICD-10-CM | POA: Diagnosis not present

## 2016-12-26 DIAGNOSIS — Z9981 Dependence on supplemental oxygen: Secondary | ICD-10-CM | POA: Diagnosis not present

## 2016-12-26 DIAGNOSIS — F0280 Dementia in other diseases classified elsewhere without behavioral disturbance: Secondary | ICD-10-CM | POA: Diagnosis not present

## 2016-12-27 DIAGNOSIS — E785 Hyperlipidemia, unspecified: Secondary | ICD-10-CM | POA: Diagnosis not present

## 2016-12-27 DIAGNOSIS — I10 Essential (primary) hypertension: Secondary | ICD-10-CM | POA: Diagnosis not present

## 2016-12-27 DIAGNOSIS — F0280 Dementia in other diseases classified elsewhere without behavioral disturbance: Secondary | ICD-10-CM | POA: Diagnosis not present

## 2016-12-27 DIAGNOSIS — G309 Alzheimer's disease, unspecified: Secondary | ICD-10-CM | POA: Diagnosis not present

## 2016-12-27 DIAGNOSIS — Z9981 Dependence on supplemental oxygen: Secondary | ICD-10-CM | POA: Diagnosis not present

## 2016-12-27 DIAGNOSIS — J449 Chronic obstructive pulmonary disease, unspecified: Secondary | ICD-10-CM | POA: Diagnosis not present

## 2016-12-28 DIAGNOSIS — J449 Chronic obstructive pulmonary disease, unspecified: Secondary | ICD-10-CM | POA: Diagnosis not present

## 2016-12-28 DIAGNOSIS — F0280 Dementia in other diseases classified elsewhere without behavioral disturbance: Secondary | ICD-10-CM | POA: Diagnosis not present

## 2016-12-28 DIAGNOSIS — Z9981 Dependence on supplemental oxygen: Secondary | ICD-10-CM | POA: Diagnosis not present

## 2016-12-28 DIAGNOSIS — G309 Alzheimer's disease, unspecified: Secondary | ICD-10-CM | POA: Diagnosis not present

## 2016-12-28 DIAGNOSIS — I10 Essential (primary) hypertension: Secondary | ICD-10-CM | POA: Diagnosis not present

## 2016-12-28 DIAGNOSIS — E785 Hyperlipidemia, unspecified: Secondary | ICD-10-CM | POA: Diagnosis not present

## 2016-12-29 DIAGNOSIS — Z9981 Dependence on supplemental oxygen: Secondary | ICD-10-CM | POA: Diagnosis not present

## 2016-12-29 DIAGNOSIS — J449 Chronic obstructive pulmonary disease, unspecified: Secondary | ICD-10-CM | POA: Diagnosis not present

## 2016-12-29 DIAGNOSIS — F0280 Dementia in other diseases classified elsewhere without behavioral disturbance: Secondary | ICD-10-CM | POA: Diagnosis not present

## 2016-12-29 DIAGNOSIS — I10 Essential (primary) hypertension: Secondary | ICD-10-CM | POA: Diagnosis not present

## 2016-12-29 DIAGNOSIS — G309 Alzheimer's disease, unspecified: Secondary | ICD-10-CM | POA: Diagnosis not present

## 2016-12-29 DIAGNOSIS — E785 Hyperlipidemia, unspecified: Secondary | ICD-10-CM | POA: Diagnosis not present

## 2016-12-30 DIAGNOSIS — J449 Chronic obstructive pulmonary disease, unspecified: Secondary | ICD-10-CM | POA: Diagnosis not present

## 2016-12-30 DIAGNOSIS — Z9981 Dependence on supplemental oxygen: Secondary | ICD-10-CM | POA: Diagnosis not present

## 2016-12-30 DIAGNOSIS — I10 Essential (primary) hypertension: Secondary | ICD-10-CM | POA: Diagnosis not present

## 2016-12-30 DIAGNOSIS — G309 Alzheimer's disease, unspecified: Secondary | ICD-10-CM | POA: Diagnosis not present

## 2016-12-30 DIAGNOSIS — E785 Hyperlipidemia, unspecified: Secondary | ICD-10-CM | POA: Diagnosis not present

## 2016-12-30 DIAGNOSIS — F0280 Dementia in other diseases classified elsewhere without behavioral disturbance: Secondary | ICD-10-CM | POA: Diagnosis not present

## 2017-01-02 DIAGNOSIS — F0280 Dementia in other diseases classified elsewhere without behavioral disturbance: Secondary | ICD-10-CM | POA: Diagnosis not present

## 2017-01-02 DIAGNOSIS — Z9981 Dependence on supplemental oxygen: Secondary | ICD-10-CM | POA: Diagnosis not present

## 2017-01-02 DIAGNOSIS — I10 Essential (primary) hypertension: Secondary | ICD-10-CM | POA: Diagnosis not present

## 2017-01-02 DIAGNOSIS — J449 Chronic obstructive pulmonary disease, unspecified: Secondary | ICD-10-CM | POA: Diagnosis not present

## 2017-01-02 DIAGNOSIS — E785 Hyperlipidemia, unspecified: Secondary | ICD-10-CM | POA: Diagnosis not present

## 2017-01-02 DIAGNOSIS — G309 Alzheimer's disease, unspecified: Secondary | ICD-10-CM | POA: Diagnosis not present

## 2017-01-03 DIAGNOSIS — G309 Alzheimer's disease, unspecified: Secondary | ICD-10-CM | POA: Diagnosis not present

## 2017-01-03 DIAGNOSIS — J449 Chronic obstructive pulmonary disease, unspecified: Secondary | ICD-10-CM | POA: Diagnosis not present

## 2017-01-03 DIAGNOSIS — F0280 Dementia in other diseases classified elsewhere without behavioral disturbance: Secondary | ICD-10-CM | POA: Diagnosis not present

## 2017-01-03 DIAGNOSIS — Z9981 Dependence on supplemental oxygen: Secondary | ICD-10-CM | POA: Diagnosis not present

## 2017-01-03 DIAGNOSIS — E785 Hyperlipidemia, unspecified: Secondary | ICD-10-CM | POA: Diagnosis not present

## 2017-01-03 DIAGNOSIS — I10 Essential (primary) hypertension: Secondary | ICD-10-CM | POA: Diagnosis not present

## 2017-01-04 DIAGNOSIS — I10 Essential (primary) hypertension: Secondary | ICD-10-CM | POA: Diagnosis not present

## 2017-01-04 DIAGNOSIS — G309 Alzheimer's disease, unspecified: Secondary | ICD-10-CM | POA: Diagnosis not present

## 2017-01-04 DIAGNOSIS — Z9981 Dependence on supplemental oxygen: Secondary | ICD-10-CM | POA: Diagnosis not present

## 2017-01-04 DIAGNOSIS — F0280 Dementia in other diseases classified elsewhere without behavioral disturbance: Secondary | ICD-10-CM | POA: Diagnosis not present

## 2017-01-04 DIAGNOSIS — E785 Hyperlipidemia, unspecified: Secondary | ICD-10-CM | POA: Diagnosis not present

## 2017-01-04 DIAGNOSIS — J449 Chronic obstructive pulmonary disease, unspecified: Secondary | ICD-10-CM | POA: Diagnosis not present

## 2017-01-05 DIAGNOSIS — F0280 Dementia in other diseases classified elsewhere without behavioral disturbance: Secondary | ICD-10-CM | POA: Diagnosis not present

## 2017-01-05 DIAGNOSIS — G309 Alzheimer's disease, unspecified: Secondary | ICD-10-CM | POA: Diagnosis not present

## 2017-01-05 DIAGNOSIS — E785 Hyperlipidemia, unspecified: Secondary | ICD-10-CM | POA: Diagnosis not present

## 2017-01-05 DIAGNOSIS — Z9981 Dependence on supplemental oxygen: Secondary | ICD-10-CM | POA: Diagnosis not present

## 2017-01-05 DIAGNOSIS — I10 Essential (primary) hypertension: Secondary | ICD-10-CM | POA: Diagnosis not present

## 2017-01-05 DIAGNOSIS — J449 Chronic obstructive pulmonary disease, unspecified: Secondary | ICD-10-CM | POA: Diagnosis not present

## 2017-01-06 DIAGNOSIS — F0280 Dementia in other diseases classified elsewhere without behavioral disturbance: Secondary | ICD-10-CM | POA: Diagnosis not present

## 2017-01-06 DIAGNOSIS — J449 Chronic obstructive pulmonary disease, unspecified: Secondary | ICD-10-CM | POA: Diagnosis not present

## 2017-01-06 DIAGNOSIS — Z9981 Dependence on supplemental oxygen: Secondary | ICD-10-CM | POA: Diagnosis not present

## 2017-01-06 DIAGNOSIS — G309 Alzheimer's disease, unspecified: Secondary | ICD-10-CM | POA: Diagnosis not present

## 2017-01-06 DIAGNOSIS — I10 Essential (primary) hypertension: Secondary | ICD-10-CM | POA: Diagnosis not present

## 2017-01-06 DIAGNOSIS — E785 Hyperlipidemia, unspecified: Secondary | ICD-10-CM | POA: Diagnosis not present

## 2017-01-09 DIAGNOSIS — E785 Hyperlipidemia, unspecified: Secondary | ICD-10-CM | POA: Diagnosis not present

## 2017-01-09 DIAGNOSIS — I10 Essential (primary) hypertension: Secondary | ICD-10-CM | POA: Diagnosis not present

## 2017-01-09 DIAGNOSIS — G309 Alzheimer's disease, unspecified: Secondary | ICD-10-CM | POA: Diagnosis not present

## 2017-01-09 DIAGNOSIS — J449 Chronic obstructive pulmonary disease, unspecified: Secondary | ICD-10-CM | POA: Diagnosis not present

## 2017-01-09 DIAGNOSIS — F0280 Dementia in other diseases classified elsewhere without behavioral disturbance: Secondary | ICD-10-CM | POA: Diagnosis not present

## 2017-01-09 DIAGNOSIS — Z9981 Dependence on supplemental oxygen: Secondary | ICD-10-CM | POA: Diagnosis not present

## 2017-01-10 DIAGNOSIS — G309 Alzheimer's disease, unspecified: Secondary | ICD-10-CM | POA: Diagnosis not present

## 2017-01-10 DIAGNOSIS — F0280 Dementia in other diseases classified elsewhere without behavioral disturbance: Secondary | ICD-10-CM | POA: Diagnosis not present

## 2017-01-10 DIAGNOSIS — J449 Chronic obstructive pulmonary disease, unspecified: Secondary | ICD-10-CM | POA: Diagnosis not present

## 2017-01-10 DIAGNOSIS — I10 Essential (primary) hypertension: Secondary | ICD-10-CM | POA: Diagnosis not present

## 2017-01-10 DIAGNOSIS — Z9981 Dependence on supplemental oxygen: Secondary | ICD-10-CM | POA: Diagnosis not present

## 2017-01-10 DIAGNOSIS — E785 Hyperlipidemia, unspecified: Secondary | ICD-10-CM | POA: Diagnosis not present

## 2017-01-11 DIAGNOSIS — E785 Hyperlipidemia, unspecified: Secondary | ICD-10-CM | POA: Diagnosis not present

## 2017-01-11 DIAGNOSIS — Z9981 Dependence on supplemental oxygen: Secondary | ICD-10-CM | POA: Diagnosis not present

## 2017-01-11 DIAGNOSIS — I10 Essential (primary) hypertension: Secondary | ICD-10-CM | POA: Diagnosis not present

## 2017-01-11 DIAGNOSIS — G309 Alzheimer's disease, unspecified: Secondary | ICD-10-CM | POA: Diagnosis not present

## 2017-01-11 DIAGNOSIS — J449 Chronic obstructive pulmonary disease, unspecified: Secondary | ICD-10-CM | POA: Diagnosis not present

## 2017-01-11 DIAGNOSIS — F0280 Dementia in other diseases classified elsewhere without behavioral disturbance: Secondary | ICD-10-CM | POA: Diagnosis not present

## 2017-01-12 DIAGNOSIS — E785 Hyperlipidemia, unspecified: Secondary | ICD-10-CM | POA: Diagnosis not present

## 2017-01-12 DIAGNOSIS — I10 Essential (primary) hypertension: Secondary | ICD-10-CM | POA: Diagnosis not present

## 2017-01-12 DIAGNOSIS — G309 Alzheimer's disease, unspecified: Secondary | ICD-10-CM | POA: Diagnosis not present

## 2017-01-12 DIAGNOSIS — J449 Chronic obstructive pulmonary disease, unspecified: Secondary | ICD-10-CM | POA: Diagnosis not present

## 2017-01-12 DIAGNOSIS — Z9981 Dependence on supplemental oxygen: Secondary | ICD-10-CM | POA: Diagnosis not present

## 2017-01-12 DIAGNOSIS — F0280 Dementia in other diseases classified elsewhere without behavioral disturbance: Secondary | ICD-10-CM | POA: Diagnosis not present

## 2017-01-13 DIAGNOSIS — I10 Essential (primary) hypertension: Secondary | ICD-10-CM | POA: Diagnosis not present

## 2017-01-13 DIAGNOSIS — F0280 Dementia in other diseases classified elsewhere without behavioral disturbance: Secondary | ICD-10-CM | POA: Diagnosis not present

## 2017-01-13 DIAGNOSIS — E785 Hyperlipidemia, unspecified: Secondary | ICD-10-CM | POA: Diagnosis not present

## 2017-01-13 DIAGNOSIS — J449 Chronic obstructive pulmonary disease, unspecified: Secondary | ICD-10-CM | POA: Diagnosis not present

## 2017-01-13 DIAGNOSIS — G309 Alzheimer's disease, unspecified: Secondary | ICD-10-CM | POA: Diagnosis not present

## 2017-01-13 DIAGNOSIS — Z9981 Dependence on supplemental oxygen: Secondary | ICD-10-CM | POA: Diagnosis not present

## 2017-01-14 DIAGNOSIS — Z85820 Personal history of malignant melanoma of skin: Secondary | ICD-10-CM | POA: Diagnosis not present

## 2017-01-14 DIAGNOSIS — Z9981 Dependence on supplemental oxygen: Secondary | ICD-10-CM | POA: Diagnosis not present

## 2017-01-14 DIAGNOSIS — H409 Unspecified glaucoma: Secondary | ICD-10-CM | POA: Diagnosis not present

## 2017-01-14 DIAGNOSIS — G309 Alzheimer's disease, unspecified: Secondary | ICD-10-CM | POA: Diagnosis not present

## 2017-01-14 DIAGNOSIS — F0280 Dementia in other diseases classified elsewhere without behavioral disturbance: Secondary | ICD-10-CM | POA: Diagnosis not present

## 2017-01-14 DIAGNOSIS — J449 Chronic obstructive pulmonary disease, unspecified: Secondary | ICD-10-CM | POA: Diagnosis not present

## 2017-01-14 DIAGNOSIS — I10 Essential (primary) hypertension: Secondary | ICD-10-CM | POA: Diagnosis not present

## 2017-01-14 DIAGNOSIS — E785 Hyperlipidemia, unspecified: Secondary | ICD-10-CM | POA: Diagnosis not present

## 2017-01-16 DIAGNOSIS — J449 Chronic obstructive pulmonary disease, unspecified: Secondary | ICD-10-CM | POA: Diagnosis not present

## 2017-01-16 DIAGNOSIS — E785 Hyperlipidemia, unspecified: Secondary | ICD-10-CM | POA: Diagnosis not present

## 2017-01-16 DIAGNOSIS — F0280 Dementia in other diseases classified elsewhere without behavioral disturbance: Secondary | ICD-10-CM | POA: Diagnosis not present

## 2017-01-16 DIAGNOSIS — I10 Essential (primary) hypertension: Secondary | ICD-10-CM | POA: Diagnosis not present

## 2017-01-16 DIAGNOSIS — Z9981 Dependence on supplemental oxygen: Secondary | ICD-10-CM | POA: Diagnosis not present

## 2017-01-16 DIAGNOSIS — G309 Alzheimer's disease, unspecified: Secondary | ICD-10-CM | POA: Diagnosis not present

## 2017-01-17 DIAGNOSIS — E785 Hyperlipidemia, unspecified: Secondary | ICD-10-CM | POA: Diagnosis not present

## 2017-01-17 DIAGNOSIS — I10 Essential (primary) hypertension: Secondary | ICD-10-CM | POA: Diagnosis not present

## 2017-01-17 DIAGNOSIS — J449 Chronic obstructive pulmonary disease, unspecified: Secondary | ICD-10-CM | POA: Diagnosis not present

## 2017-01-17 DIAGNOSIS — G309 Alzheimer's disease, unspecified: Secondary | ICD-10-CM | POA: Diagnosis not present

## 2017-01-17 DIAGNOSIS — Z9981 Dependence on supplemental oxygen: Secondary | ICD-10-CM | POA: Diagnosis not present

## 2017-01-17 DIAGNOSIS — F0280 Dementia in other diseases classified elsewhere without behavioral disturbance: Secondary | ICD-10-CM | POA: Diagnosis not present

## 2017-01-18 DIAGNOSIS — Z9981 Dependence on supplemental oxygen: Secondary | ICD-10-CM | POA: Diagnosis not present

## 2017-01-18 DIAGNOSIS — G309 Alzheimer's disease, unspecified: Secondary | ICD-10-CM | POA: Diagnosis not present

## 2017-01-18 DIAGNOSIS — I10 Essential (primary) hypertension: Secondary | ICD-10-CM | POA: Diagnosis not present

## 2017-01-18 DIAGNOSIS — E785 Hyperlipidemia, unspecified: Secondary | ICD-10-CM | POA: Diagnosis not present

## 2017-01-18 DIAGNOSIS — F0280 Dementia in other diseases classified elsewhere without behavioral disturbance: Secondary | ICD-10-CM | POA: Diagnosis not present

## 2017-01-18 DIAGNOSIS — J449 Chronic obstructive pulmonary disease, unspecified: Secondary | ICD-10-CM | POA: Diagnosis not present

## 2017-01-19 DIAGNOSIS — I10 Essential (primary) hypertension: Secondary | ICD-10-CM | POA: Diagnosis not present

## 2017-01-19 DIAGNOSIS — J449 Chronic obstructive pulmonary disease, unspecified: Secondary | ICD-10-CM | POA: Diagnosis not present

## 2017-01-19 DIAGNOSIS — G309 Alzheimer's disease, unspecified: Secondary | ICD-10-CM | POA: Diagnosis not present

## 2017-01-19 DIAGNOSIS — Z9981 Dependence on supplemental oxygen: Secondary | ICD-10-CM | POA: Diagnosis not present

## 2017-01-19 DIAGNOSIS — E785 Hyperlipidemia, unspecified: Secondary | ICD-10-CM | POA: Diagnosis not present

## 2017-01-19 DIAGNOSIS — F0280 Dementia in other diseases classified elsewhere without behavioral disturbance: Secondary | ICD-10-CM | POA: Diagnosis not present

## 2017-01-20 DIAGNOSIS — F0280 Dementia in other diseases classified elsewhere without behavioral disturbance: Secondary | ICD-10-CM | POA: Diagnosis not present

## 2017-01-20 DIAGNOSIS — E785 Hyperlipidemia, unspecified: Secondary | ICD-10-CM | POA: Diagnosis not present

## 2017-01-20 DIAGNOSIS — Z9981 Dependence on supplemental oxygen: Secondary | ICD-10-CM | POA: Diagnosis not present

## 2017-01-20 DIAGNOSIS — I10 Essential (primary) hypertension: Secondary | ICD-10-CM | POA: Diagnosis not present

## 2017-01-20 DIAGNOSIS — J449 Chronic obstructive pulmonary disease, unspecified: Secondary | ICD-10-CM | POA: Diagnosis not present

## 2017-01-20 DIAGNOSIS — G309 Alzheimer's disease, unspecified: Secondary | ICD-10-CM | POA: Diagnosis not present

## 2017-01-23 DIAGNOSIS — F0280 Dementia in other diseases classified elsewhere without behavioral disturbance: Secondary | ICD-10-CM | POA: Diagnosis not present

## 2017-01-23 DIAGNOSIS — J449 Chronic obstructive pulmonary disease, unspecified: Secondary | ICD-10-CM | POA: Diagnosis not present

## 2017-01-23 DIAGNOSIS — Z9981 Dependence on supplemental oxygen: Secondary | ICD-10-CM | POA: Diagnosis not present

## 2017-01-23 DIAGNOSIS — I10 Essential (primary) hypertension: Secondary | ICD-10-CM | POA: Diagnosis not present

## 2017-01-23 DIAGNOSIS — E785 Hyperlipidemia, unspecified: Secondary | ICD-10-CM | POA: Diagnosis not present

## 2017-01-23 DIAGNOSIS — G309 Alzheimer's disease, unspecified: Secondary | ICD-10-CM | POA: Diagnosis not present

## 2017-01-24 ENCOUNTER — Telehealth: Payer: Self-pay | Admitting: Family Medicine

## 2017-01-24 DIAGNOSIS — J449 Chronic obstructive pulmonary disease, unspecified: Secondary | ICD-10-CM | POA: Diagnosis not present

## 2017-01-24 DIAGNOSIS — Z9981 Dependence on supplemental oxygen: Secondary | ICD-10-CM | POA: Diagnosis not present

## 2017-01-24 DIAGNOSIS — I10 Essential (primary) hypertension: Secondary | ICD-10-CM | POA: Diagnosis not present

## 2017-01-24 DIAGNOSIS — F0280 Dementia in other diseases classified elsewhere without behavioral disturbance: Secondary | ICD-10-CM | POA: Diagnosis not present

## 2017-01-24 DIAGNOSIS — E785 Hyperlipidemia, unspecified: Secondary | ICD-10-CM | POA: Diagnosis not present

## 2017-01-24 DIAGNOSIS — G309 Alzheimer's disease, unspecified: Secondary | ICD-10-CM | POA: Diagnosis not present

## 2017-01-24 NOTE — Telephone Encounter (Signed)
Pt's son called saying he scheduled an apt tomorrow for an evaluation on his dad for guardianship.  He wanted to know if it were really necessary for him to come in.  He has been at [facility] for 5 years and they know his medical condition as does Dr. Caryn Section.  Can someone call him back at [phone]  Thanks teri

## 2017-01-24 NOTE — Telephone Encounter (Signed)
I really don't know what the purpose of the visit is. Are there some forms or an evaluation that is needed?

## 2017-01-24 NOTE — Telephone Encounter (Signed)
Please advise 

## 2017-01-24 NOTE — Telephone Encounter (Signed)
error 

## 2017-01-24 NOTE — Telephone Encounter (Signed)
Pt has a new wet cough, edema lower legs. Some crackles in lungs.  They want to know if they can use lasix 10 mg once a day x 3 days.  Please advise  [phone]

## 2017-01-25 ENCOUNTER — Telehealth: Payer: Self-pay

## 2017-01-25 ENCOUNTER — Ambulatory Visit (INDEPENDENT_AMBULATORY_CARE_PROVIDER_SITE_OTHER): Payer: Medicare Other | Admitting: Family Medicine

## 2017-01-25 ENCOUNTER — Encounter: Payer: Self-pay | Admitting: Family Medicine

## 2017-01-25 VITALS — BP 108/58 | HR 55 | Temp 97.9°F | Resp 18

## 2017-01-25 DIAGNOSIS — J40 Bronchitis, not specified as acute or chronic: Secondary | ICD-10-CM

## 2017-01-25 DIAGNOSIS — F0280 Dementia in other diseases classified elsewhere without behavioral disturbance: Secondary | ICD-10-CM

## 2017-01-25 DIAGNOSIS — J449 Chronic obstructive pulmonary disease, unspecified: Secondary | ICD-10-CM

## 2017-01-25 DIAGNOSIS — Z23 Encounter for immunization: Secondary | ICD-10-CM

## 2017-01-25 DIAGNOSIS — G309 Alzheimer's disease, unspecified: Secondary | ICD-10-CM

## 2017-01-25 DIAGNOSIS — E785 Hyperlipidemia, unspecified: Secondary | ICD-10-CM | POA: Diagnosis not present

## 2017-01-25 DIAGNOSIS — Z9981 Dependence on supplemental oxygen: Secondary | ICD-10-CM | POA: Diagnosis not present

## 2017-01-25 DIAGNOSIS — I10 Essential (primary) hypertension: Secondary | ICD-10-CM | POA: Diagnosis not present

## 2017-01-25 MED ORDER — LEVOFLOXACIN 750 MG PO TABS: 750.0000 mg | ORAL_TABLET | Freq: Every day | ORAL | 0 refills | Status: DC

## 2017-01-25 MED ORDER — LEVOFLOXACIN 750 MG PO TABS: 750.0000 mg | ORAL_TABLET | Freq: Every day | ORAL | 0 refills | Status: AC

## 2017-01-25 NOTE — Progress Notes (Signed)
Patient: Adam Singleton Male    DOB: 1928-11-25   83 y.o.   MRN: [id number] Visit Date: 01/25/2017  Today's Provider: Lelon Huh, MD   Chief Complaint  Patient presents with  . Cough   Subjective:    Cough  This is a new problem. The current episode started in the past 7 days. The problem has been unchanged. The cough is productive of sputum. Pertinent negatives include no fever.  Patient also has swelling in the lower legs.   Dementia:  Patient is here today with his son Adam Singleton, who states patient is no longer able to make decisions for himself. Patients son is pursuing guardianship. Marland Kitchen He currently resides at [facility], but is requiring increasing levels of care and is anticipated to require skilled nursing soon.     Allergies  Allergen Reactions  . Haldol [Haloperidol Lactate] Other (See Comments)    hallucinations  . Vancomycin Hcl Rash     Current Outpatient Prescriptions:  .  acetaminophen (TYLENOL) 325 MG tablet, Take 2 tablets by mouth every 4 (four) hours as needed. for mild pain or fever, Disp: , Rfl:  .  aspirin 81 MG EC tablet, Take 1 tablet by mouth daily., Disp: , Rfl:  .  bisacodyl (DULCOLAX) 10 MG suppository, Place 10 mg rectally once., Disp: , Rfl:  .  Dextromethorphan-Guaifenesin (ROBAFEN DM) 10-100 MG/5ML liquid, Take 5 mLs by mouth every 4 (four) hours as needed (cough)., Disp: , Rfl:  .  donepezil (ARICEPT) 10 MG tablet, Take 1 tablet by mouth at bedtime., Disp: , Rfl:  .  ipratropium-albuterol (DUONEB) 0.5-2.5 (3) MG/3ML SOLN, Take 3 mLs by nebulization 2 (two) times daily., Disp: , Rfl:  .  lovastatin (MEVACOR) 20 MG tablet, Take 1 tablet by mouth daily., Disp: , Rfl:  .  memantine (NAMENDA) 10 MG tablet, Take 1 tablet by mouth 2 (two) times daily., Disp: , Rfl:  .  oxyCODONE (OXY IR/ROXICODONE) 5 MG immediate release tablet, Take 1 tablet (5 mg total) by mouth every 4 (four) hours as needed for moderate pain., Disp: 60 tablet, Rfl: 0 .   polyethylene glycol (MIRALAX / GLYCOLAX) packet, Take 17 g by mouth every other day. , Disp: , Rfl:  .  sennosides-docusate sodium (SENOKOT-S) 8.6-50 MG tablet, Take 2 tablets by mouth 2 (two) times daily. , Disp: , Rfl:  .  Travoprost, BAK Free, (TRAVATAN) 0.004 % SOLN ophthalmic solution, Apply 1 drop to eye at bedtime., Disp: , Rfl:   Review of Systems  Constitutional: Negative for fever.  Respiratory: Positive for cough.   Cardiovascular: Positive for leg swelling.  Psychiatric/Behavioral: Positive for confusion and decreased concentration.    Social History  Substance Use Topics  . Smoking status: Former Smoker    Packs/day: 1.00    Years: 40.00    Types: Cigarettes    Quit date: 05/16/1988  . Smokeless tobacco: Never Used  . Alcohol use No   Objective:   Resp 18  Vitals:   01/25/17 1150  BP: (!) 108/58  Pulse: (!) 55  Resp: 18  Temp: 97.9 F (36.6 C)  TempSrc: Oral  SpO2: 97%     Physical Exam   General Appearance:    Alert, cooperative, no distress  Eyes:    PERRL, conjunctiva/corneas clear, EOM's intact       Lungs:     Occasional expiratory wheeze, no rales, , respirations unlabored  Heart:    Regular rate and rhythm  Neurologic:   Awake, alert. Does not understand questions or verbal commands           Assessment & Plan:     1. Alzheimer's dementia without behavioral disturbance, unspecified timing of dementia onset Discussed likely lack of benefit from continuation of dementia medications. Will stop Aricept and namenda for the time being. Son is concerned about how this will affect behavior,and advised to call if there are any issues and we could restart medications if they seem to make any difference. Also discussed that statin is unlikely to be of any benefit to patient's QOL at this point, and will discontinue lovastatin.   Short statement printed on letterhead that patient lacks ability to manage own affairs due to advanced dementia.   2. Chronic  obstructive pulmonary disease, unspecified COPD type ([place]) Stable on current inhalers.   3. Bronchitis Start levofloxacin 750 daily, and call if not improving in a few days.   4. Need for influenza vaccination  - Flu vaccine HIGH DOSE PF (Fluzone High dose)       Lelon Huh, MD  [facility]

## 2017-01-25 NOTE — Telephone Encounter (Signed)
Please review

## 2017-01-25 NOTE — Telephone Encounter (Signed)
Spoke with pt's son concerning message below. Pt's son is bringing him today and just wants provider to listen to pt's lungs.

## 2017-01-25 NOTE — Telephone Encounter (Signed)
Can take namenda 10mg  once a day for 7 days, then discontinue

## 2017-01-25 NOTE — Telephone Encounter (Signed)
Stacy with Hospice is requesting a verbal order to taper off of Namenda 10 mg. She states patient has been taking medication for a while, and can have withdraws if he does not taper down. CB# [phone].

## 2017-01-25 NOTE — Telephone Encounter (Signed)
According to pt's son they need a letter or statement from provider stating pt's physical and mental condition. Son is bringing pt in to office.

## 2017-01-25 NOTE — Telephone Encounter (Signed)
Marzetta Board was notified.

## 2017-01-26 DIAGNOSIS — E785 Hyperlipidemia, unspecified: Secondary | ICD-10-CM | POA: Diagnosis not present

## 2017-01-26 DIAGNOSIS — G309 Alzheimer's disease, unspecified: Secondary | ICD-10-CM | POA: Diagnosis not present

## 2017-01-26 DIAGNOSIS — I10 Essential (primary) hypertension: Secondary | ICD-10-CM | POA: Diagnosis not present

## 2017-01-26 DIAGNOSIS — J449 Chronic obstructive pulmonary disease, unspecified: Secondary | ICD-10-CM | POA: Diagnosis not present

## 2017-01-26 DIAGNOSIS — Z9981 Dependence on supplemental oxygen: Secondary | ICD-10-CM | POA: Diagnosis not present

## 2017-01-26 DIAGNOSIS — F0280 Dementia in other diseases classified elsewhere without behavioral disturbance: Secondary | ICD-10-CM | POA: Diagnosis not present

## 2017-01-27 DIAGNOSIS — Z9981 Dependence on supplemental oxygen: Secondary | ICD-10-CM | POA: Diagnosis not present

## 2017-01-27 DIAGNOSIS — G309 Alzheimer's disease, unspecified: Secondary | ICD-10-CM | POA: Diagnosis not present

## 2017-01-27 DIAGNOSIS — I10 Essential (primary) hypertension: Secondary | ICD-10-CM | POA: Diagnosis not present

## 2017-01-27 DIAGNOSIS — J449 Chronic obstructive pulmonary disease, unspecified: Secondary | ICD-10-CM | POA: Diagnosis not present

## 2017-01-27 DIAGNOSIS — F0280 Dementia in other diseases classified elsewhere without behavioral disturbance: Secondary | ICD-10-CM | POA: Diagnosis not present

## 2017-01-27 DIAGNOSIS — E785 Hyperlipidemia, unspecified: Secondary | ICD-10-CM | POA: Diagnosis not present

## 2017-01-30 DIAGNOSIS — E785 Hyperlipidemia, unspecified: Secondary | ICD-10-CM | POA: Diagnosis not present

## 2017-01-30 DIAGNOSIS — J449 Chronic obstructive pulmonary disease, unspecified: Secondary | ICD-10-CM | POA: Diagnosis not present

## 2017-01-30 DIAGNOSIS — G309 Alzheimer's disease, unspecified: Secondary | ICD-10-CM | POA: Diagnosis not present

## 2017-01-30 DIAGNOSIS — I10 Essential (primary) hypertension: Secondary | ICD-10-CM | POA: Diagnosis not present

## 2017-01-30 DIAGNOSIS — F0280 Dementia in other diseases classified elsewhere without behavioral disturbance: Secondary | ICD-10-CM | POA: Diagnosis not present

## 2017-01-30 DIAGNOSIS — Z9981 Dependence on supplemental oxygen: Secondary | ICD-10-CM | POA: Diagnosis not present

## 2017-01-31 DIAGNOSIS — G309 Alzheimer's disease, unspecified: Secondary | ICD-10-CM | POA: Diagnosis not present

## 2017-01-31 DIAGNOSIS — E785 Hyperlipidemia, unspecified: Secondary | ICD-10-CM | POA: Diagnosis not present

## 2017-01-31 DIAGNOSIS — F0280 Dementia in other diseases classified elsewhere without behavioral disturbance: Secondary | ICD-10-CM | POA: Diagnosis not present

## 2017-01-31 DIAGNOSIS — Z9981 Dependence on supplemental oxygen: Secondary | ICD-10-CM | POA: Diagnosis not present

## 2017-01-31 DIAGNOSIS — I10 Essential (primary) hypertension: Secondary | ICD-10-CM | POA: Diagnosis not present

## 2017-01-31 DIAGNOSIS — J449 Chronic obstructive pulmonary disease, unspecified: Secondary | ICD-10-CM | POA: Diagnosis not present

## 2017-02-01 DIAGNOSIS — I10 Essential (primary) hypertension: Secondary | ICD-10-CM | POA: Diagnosis not present

## 2017-02-01 DIAGNOSIS — F0280 Dementia in other diseases classified elsewhere without behavioral disturbance: Secondary | ICD-10-CM | POA: Diagnosis not present

## 2017-02-01 DIAGNOSIS — G309 Alzheimer's disease, unspecified: Secondary | ICD-10-CM | POA: Diagnosis not present

## 2017-02-01 DIAGNOSIS — Z9981 Dependence on supplemental oxygen: Secondary | ICD-10-CM | POA: Diagnosis not present

## 2017-02-01 DIAGNOSIS — J449 Chronic obstructive pulmonary disease, unspecified: Secondary | ICD-10-CM | POA: Diagnosis not present

## 2017-02-01 DIAGNOSIS — E785 Hyperlipidemia, unspecified: Secondary | ICD-10-CM | POA: Diagnosis not present

## 2017-02-02 DIAGNOSIS — I10 Essential (primary) hypertension: Secondary | ICD-10-CM | POA: Diagnosis not present

## 2017-02-02 DIAGNOSIS — F0280 Dementia in other diseases classified elsewhere without behavioral disturbance: Secondary | ICD-10-CM | POA: Diagnosis not present

## 2017-02-02 DIAGNOSIS — G309 Alzheimer's disease, unspecified: Secondary | ICD-10-CM | POA: Diagnosis not present

## 2017-02-02 DIAGNOSIS — J449 Chronic obstructive pulmonary disease, unspecified: Secondary | ICD-10-CM | POA: Diagnosis not present

## 2017-02-02 DIAGNOSIS — Z9981 Dependence on supplemental oxygen: Secondary | ICD-10-CM | POA: Diagnosis not present

## 2017-02-02 DIAGNOSIS — E785 Hyperlipidemia, unspecified: Secondary | ICD-10-CM | POA: Diagnosis not present

## 2017-02-03 DIAGNOSIS — Z9981 Dependence on supplemental oxygen: Secondary | ICD-10-CM | POA: Diagnosis not present

## 2017-02-03 DIAGNOSIS — J449 Chronic obstructive pulmonary disease, unspecified: Secondary | ICD-10-CM | POA: Diagnosis not present

## 2017-02-03 DIAGNOSIS — G309 Alzheimer's disease, unspecified: Secondary | ICD-10-CM | POA: Diagnosis not present

## 2017-02-03 DIAGNOSIS — F0280 Dementia in other diseases classified elsewhere without behavioral disturbance: Secondary | ICD-10-CM | POA: Diagnosis not present

## 2017-02-03 DIAGNOSIS — I10 Essential (primary) hypertension: Secondary | ICD-10-CM | POA: Diagnosis not present

## 2017-02-03 DIAGNOSIS — E785 Hyperlipidemia, unspecified: Secondary | ICD-10-CM | POA: Diagnosis not present

## 2017-02-06 DIAGNOSIS — F0280 Dementia in other diseases classified elsewhere without behavioral disturbance: Secondary | ICD-10-CM | POA: Diagnosis not present

## 2017-02-06 DIAGNOSIS — E785 Hyperlipidemia, unspecified: Secondary | ICD-10-CM | POA: Diagnosis not present

## 2017-02-06 DIAGNOSIS — I10 Essential (primary) hypertension: Secondary | ICD-10-CM | POA: Diagnosis not present

## 2017-02-06 DIAGNOSIS — Z9981 Dependence on supplemental oxygen: Secondary | ICD-10-CM | POA: Diagnosis not present

## 2017-02-06 DIAGNOSIS — J449 Chronic obstructive pulmonary disease, unspecified: Secondary | ICD-10-CM | POA: Diagnosis not present

## 2017-02-06 DIAGNOSIS — G309 Alzheimer's disease, unspecified: Secondary | ICD-10-CM | POA: Diagnosis not present

## 2017-02-07 DIAGNOSIS — G309 Alzheimer's disease, unspecified: Secondary | ICD-10-CM | POA: Diagnosis not present

## 2017-02-07 DIAGNOSIS — E785 Hyperlipidemia, unspecified: Secondary | ICD-10-CM | POA: Diagnosis not present

## 2017-02-07 DIAGNOSIS — I10 Essential (primary) hypertension: Secondary | ICD-10-CM | POA: Diagnosis not present

## 2017-02-07 DIAGNOSIS — Z9981 Dependence on supplemental oxygen: Secondary | ICD-10-CM | POA: Diagnosis not present

## 2017-02-07 DIAGNOSIS — F0280 Dementia in other diseases classified elsewhere without behavioral disturbance: Secondary | ICD-10-CM | POA: Diagnosis not present

## 2017-02-07 DIAGNOSIS — J449 Chronic obstructive pulmonary disease, unspecified: Secondary | ICD-10-CM | POA: Diagnosis not present

## 2017-02-08 ENCOUNTER — Encounter (INDEPENDENT_AMBULATORY_CARE_PROVIDER_SITE_OTHER): Payer: Medicare Other | Admitting: Family Medicine

## 2017-02-08 DIAGNOSIS — F0280 Dementia in other diseases classified elsewhere without behavioral disturbance: Secondary | ICD-10-CM

## 2017-02-08 DIAGNOSIS — G309 Alzheimer's disease, unspecified: Secondary | ICD-10-CM | POA: Diagnosis not present

## 2017-02-08 DIAGNOSIS — Z9981 Dependence on supplemental oxygen: Secondary | ICD-10-CM | POA: Diagnosis not present

## 2017-02-08 DIAGNOSIS — E785 Hyperlipidemia, unspecified: Secondary | ICD-10-CM | POA: Diagnosis not present

## 2017-02-08 DIAGNOSIS — I10 Essential (primary) hypertension: Secondary | ICD-10-CM | POA: Diagnosis not present

## 2017-02-08 DIAGNOSIS — J449 Chronic obstructive pulmonary disease, unspecified: Secondary | ICD-10-CM

## 2017-02-09 DIAGNOSIS — I10 Essential (primary) hypertension: Secondary | ICD-10-CM | POA: Diagnosis not present

## 2017-02-09 DIAGNOSIS — J449 Chronic obstructive pulmonary disease, unspecified: Secondary | ICD-10-CM | POA: Diagnosis not present

## 2017-02-09 DIAGNOSIS — E785 Hyperlipidemia, unspecified: Secondary | ICD-10-CM | POA: Diagnosis not present

## 2017-02-09 DIAGNOSIS — F0280 Dementia in other diseases classified elsewhere without behavioral disturbance: Secondary | ICD-10-CM | POA: Diagnosis not present

## 2017-02-09 DIAGNOSIS — Z9981 Dependence on supplemental oxygen: Secondary | ICD-10-CM | POA: Diagnosis not present

## 2017-02-09 DIAGNOSIS — G309 Alzheimer's disease, unspecified: Secondary | ICD-10-CM | POA: Diagnosis not present

## 2017-02-10 DIAGNOSIS — E785 Hyperlipidemia, unspecified: Secondary | ICD-10-CM | POA: Diagnosis not present

## 2017-02-10 DIAGNOSIS — I10 Essential (primary) hypertension: Secondary | ICD-10-CM | POA: Diagnosis not present

## 2017-02-10 DIAGNOSIS — G309 Alzheimer's disease, unspecified: Secondary | ICD-10-CM | POA: Diagnosis not present

## 2017-02-10 DIAGNOSIS — J449 Chronic obstructive pulmonary disease, unspecified: Secondary | ICD-10-CM | POA: Diagnosis not present

## 2017-02-10 DIAGNOSIS — Z9981 Dependence on supplemental oxygen: Secondary | ICD-10-CM | POA: Diagnosis not present

## 2017-02-10 DIAGNOSIS — F0280 Dementia in other diseases classified elsewhere without behavioral disturbance: Secondary | ICD-10-CM | POA: Diagnosis not present

## 2017-02-13 DIAGNOSIS — I10 Essential (primary) hypertension: Secondary | ICD-10-CM | POA: Diagnosis not present

## 2017-02-13 DIAGNOSIS — Z9981 Dependence on supplemental oxygen: Secondary | ICD-10-CM | POA: Diagnosis not present

## 2017-02-13 DIAGNOSIS — Z85820 Personal history of malignant melanoma of skin: Secondary | ICD-10-CM | POA: Diagnosis not present

## 2017-02-13 DIAGNOSIS — J449 Chronic obstructive pulmonary disease, unspecified: Secondary | ICD-10-CM | POA: Diagnosis not present

## 2017-02-13 DIAGNOSIS — F0280 Dementia in other diseases classified elsewhere without behavioral disturbance: Secondary | ICD-10-CM | POA: Diagnosis not present

## 2017-02-13 DIAGNOSIS — E785 Hyperlipidemia, unspecified: Secondary | ICD-10-CM | POA: Diagnosis not present

## 2017-02-13 DIAGNOSIS — G309 Alzheimer's disease, unspecified: Secondary | ICD-10-CM | POA: Diagnosis not present

## 2017-02-13 DIAGNOSIS — H409 Unspecified glaucoma: Secondary | ICD-10-CM | POA: Diagnosis not present

## 2017-02-14 DIAGNOSIS — I10 Essential (primary) hypertension: Secondary | ICD-10-CM | POA: Diagnosis not present

## 2017-02-14 DIAGNOSIS — J449 Chronic obstructive pulmonary disease, unspecified: Secondary | ICD-10-CM | POA: Diagnosis not present

## 2017-02-14 DIAGNOSIS — G309 Alzheimer's disease, unspecified: Secondary | ICD-10-CM | POA: Diagnosis not present

## 2017-02-14 DIAGNOSIS — E785 Hyperlipidemia, unspecified: Secondary | ICD-10-CM | POA: Diagnosis not present

## 2017-02-14 DIAGNOSIS — Z9981 Dependence on supplemental oxygen: Secondary | ICD-10-CM | POA: Diagnosis not present

## 2017-02-14 DIAGNOSIS — F0280 Dementia in other diseases classified elsewhere without behavioral disturbance: Secondary | ICD-10-CM | POA: Diagnosis not present

## 2017-02-15 DIAGNOSIS — E785 Hyperlipidemia, unspecified: Secondary | ICD-10-CM | POA: Diagnosis not present

## 2017-02-15 DIAGNOSIS — I10 Essential (primary) hypertension: Secondary | ICD-10-CM | POA: Diagnosis not present

## 2017-02-15 DIAGNOSIS — G309 Alzheimer's disease, unspecified: Secondary | ICD-10-CM | POA: Diagnosis not present

## 2017-02-15 DIAGNOSIS — J449 Chronic obstructive pulmonary disease, unspecified: Secondary | ICD-10-CM | POA: Diagnosis not present

## 2017-02-15 DIAGNOSIS — F0280 Dementia in other diseases classified elsewhere without behavioral disturbance: Secondary | ICD-10-CM | POA: Diagnosis not present

## 2017-02-15 DIAGNOSIS — Z9981 Dependence on supplemental oxygen: Secondary | ICD-10-CM | POA: Diagnosis not present

## 2017-02-16 DIAGNOSIS — I10 Essential (primary) hypertension: Secondary | ICD-10-CM | POA: Diagnosis not present

## 2017-02-16 DIAGNOSIS — E785 Hyperlipidemia, unspecified: Secondary | ICD-10-CM | POA: Diagnosis not present

## 2017-02-16 DIAGNOSIS — J449 Chronic obstructive pulmonary disease, unspecified: Secondary | ICD-10-CM | POA: Diagnosis not present

## 2017-02-16 DIAGNOSIS — Z9981 Dependence on supplemental oxygen: Secondary | ICD-10-CM | POA: Diagnosis not present

## 2017-02-16 DIAGNOSIS — G309 Alzheimer's disease, unspecified: Secondary | ICD-10-CM | POA: Diagnosis not present

## 2017-02-16 DIAGNOSIS — F0280 Dementia in other diseases classified elsewhere without behavioral disturbance: Secondary | ICD-10-CM | POA: Diagnosis not present

## 2017-02-17 DIAGNOSIS — G309 Alzheimer's disease, unspecified: Secondary | ICD-10-CM | POA: Diagnosis not present

## 2017-02-17 DIAGNOSIS — J449 Chronic obstructive pulmonary disease, unspecified: Secondary | ICD-10-CM | POA: Diagnosis not present

## 2017-02-17 DIAGNOSIS — E785 Hyperlipidemia, unspecified: Secondary | ICD-10-CM | POA: Diagnosis not present

## 2017-02-17 DIAGNOSIS — Z9981 Dependence on supplemental oxygen: Secondary | ICD-10-CM | POA: Diagnosis not present

## 2017-02-17 DIAGNOSIS — F0280 Dementia in other diseases classified elsewhere without behavioral disturbance: Secondary | ICD-10-CM | POA: Diagnosis not present

## 2017-02-17 DIAGNOSIS — I10 Essential (primary) hypertension: Secondary | ICD-10-CM | POA: Diagnosis not present

## 2017-02-20 DIAGNOSIS — G309 Alzheimer's disease, unspecified: Secondary | ICD-10-CM | POA: Diagnosis not present

## 2017-02-20 DIAGNOSIS — I10 Essential (primary) hypertension: Secondary | ICD-10-CM | POA: Diagnosis not present

## 2017-02-20 DIAGNOSIS — Z9981 Dependence on supplemental oxygen: Secondary | ICD-10-CM | POA: Diagnosis not present

## 2017-02-20 DIAGNOSIS — F0280 Dementia in other diseases classified elsewhere without behavioral disturbance: Secondary | ICD-10-CM | POA: Diagnosis not present

## 2017-02-20 DIAGNOSIS — E785 Hyperlipidemia, unspecified: Secondary | ICD-10-CM | POA: Diagnosis not present

## 2017-02-20 DIAGNOSIS — J449 Chronic obstructive pulmonary disease, unspecified: Secondary | ICD-10-CM | POA: Diagnosis not present

## 2017-02-21 DIAGNOSIS — Z9981 Dependence on supplemental oxygen: Secondary | ICD-10-CM | POA: Diagnosis not present

## 2017-02-21 DIAGNOSIS — F0280 Dementia in other diseases classified elsewhere without behavioral disturbance: Secondary | ICD-10-CM | POA: Diagnosis not present

## 2017-02-21 DIAGNOSIS — G309 Alzheimer's disease, unspecified: Secondary | ICD-10-CM | POA: Diagnosis not present

## 2017-02-21 DIAGNOSIS — I10 Essential (primary) hypertension: Secondary | ICD-10-CM | POA: Diagnosis not present

## 2017-02-21 DIAGNOSIS — E785 Hyperlipidemia, unspecified: Secondary | ICD-10-CM | POA: Diagnosis not present

## 2017-02-21 DIAGNOSIS — J449 Chronic obstructive pulmonary disease, unspecified: Secondary | ICD-10-CM | POA: Diagnosis not present

## 2017-02-22 DIAGNOSIS — I10 Essential (primary) hypertension: Secondary | ICD-10-CM | POA: Diagnosis not present

## 2017-02-22 DIAGNOSIS — F0280 Dementia in other diseases classified elsewhere without behavioral disturbance: Secondary | ICD-10-CM | POA: Diagnosis not present

## 2017-02-22 DIAGNOSIS — J449 Chronic obstructive pulmonary disease, unspecified: Secondary | ICD-10-CM | POA: Diagnosis not present

## 2017-02-22 DIAGNOSIS — G309 Alzheimer's disease, unspecified: Secondary | ICD-10-CM | POA: Diagnosis not present

## 2017-02-22 DIAGNOSIS — E785 Hyperlipidemia, unspecified: Secondary | ICD-10-CM | POA: Diagnosis not present

## 2017-02-22 DIAGNOSIS — Z9981 Dependence on supplemental oxygen: Secondary | ICD-10-CM | POA: Diagnosis not present

## 2017-02-23 DIAGNOSIS — F0280 Dementia in other diseases classified elsewhere without behavioral disturbance: Secondary | ICD-10-CM | POA: Diagnosis not present

## 2017-02-23 DIAGNOSIS — J449 Chronic obstructive pulmonary disease, unspecified: Secondary | ICD-10-CM | POA: Diagnosis not present

## 2017-02-23 DIAGNOSIS — Z9981 Dependence on supplemental oxygen: Secondary | ICD-10-CM | POA: Diagnosis not present

## 2017-02-23 DIAGNOSIS — E785 Hyperlipidemia, unspecified: Secondary | ICD-10-CM | POA: Diagnosis not present

## 2017-02-23 DIAGNOSIS — G309 Alzheimer's disease, unspecified: Secondary | ICD-10-CM | POA: Diagnosis not present

## 2017-02-23 DIAGNOSIS — I10 Essential (primary) hypertension: Secondary | ICD-10-CM | POA: Diagnosis not present

## 2017-02-24 DIAGNOSIS — E785 Hyperlipidemia, unspecified: Secondary | ICD-10-CM | POA: Diagnosis not present

## 2017-02-24 DIAGNOSIS — F0280 Dementia in other diseases classified elsewhere without behavioral disturbance: Secondary | ICD-10-CM | POA: Diagnosis not present

## 2017-02-24 DIAGNOSIS — I10 Essential (primary) hypertension: Secondary | ICD-10-CM | POA: Diagnosis not present

## 2017-02-24 DIAGNOSIS — Z9981 Dependence on supplemental oxygen: Secondary | ICD-10-CM | POA: Diagnosis not present

## 2017-02-24 DIAGNOSIS — G309 Alzheimer's disease, unspecified: Secondary | ICD-10-CM | POA: Diagnosis not present

## 2017-02-24 DIAGNOSIS — J449 Chronic obstructive pulmonary disease, unspecified: Secondary | ICD-10-CM | POA: Diagnosis not present

## 2017-02-27 DIAGNOSIS — Z9981 Dependence on supplemental oxygen: Secondary | ICD-10-CM | POA: Diagnosis not present

## 2017-02-27 DIAGNOSIS — E785 Hyperlipidemia, unspecified: Secondary | ICD-10-CM | POA: Diagnosis not present

## 2017-02-27 DIAGNOSIS — J449 Chronic obstructive pulmonary disease, unspecified: Secondary | ICD-10-CM | POA: Diagnosis not present

## 2017-02-27 DIAGNOSIS — F0280 Dementia in other diseases classified elsewhere without behavioral disturbance: Secondary | ICD-10-CM | POA: Diagnosis not present

## 2017-02-27 DIAGNOSIS — I10 Essential (primary) hypertension: Secondary | ICD-10-CM | POA: Diagnosis not present

## 2017-02-27 DIAGNOSIS — G309 Alzheimer's disease, unspecified: Secondary | ICD-10-CM | POA: Diagnosis not present

## 2017-02-28 DIAGNOSIS — F0280 Dementia in other diseases classified elsewhere without behavioral disturbance: Secondary | ICD-10-CM | POA: Diagnosis not present

## 2017-02-28 DIAGNOSIS — G309 Alzheimer's disease, unspecified: Secondary | ICD-10-CM | POA: Diagnosis not present

## 2017-02-28 DIAGNOSIS — Z9981 Dependence on supplemental oxygen: Secondary | ICD-10-CM | POA: Diagnosis not present

## 2017-02-28 DIAGNOSIS — E785 Hyperlipidemia, unspecified: Secondary | ICD-10-CM | POA: Diagnosis not present

## 2017-02-28 DIAGNOSIS — I10 Essential (primary) hypertension: Secondary | ICD-10-CM | POA: Diagnosis not present

## 2017-02-28 DIAGNOSIS — J449 Chronic obstructive pulmonary disease, unspecified: Secondary | ICD-10-CM | POA: Diagnosis not present

## 2017-03-01 DIAGNOSIS — J449 Chronic obstructive pulmonary disease, unspecified: Secondary | ICD-10-CM | POA: Diagnosis not present

## 2017-03-01 DIAGNOSIS — Z9981 Dependence on supplemental oxygen: Secondary | ICD-10-CM | POA: Diagnosis not present

## 2017-03-01 DIAGNOSIS — I10 Essential (primary) hypertension: Secondary | ICD-10-CM | POA: Diagnosis not present

## 2017-03-01 DIAGNOSIS — G309 Alzheimer's disease, unspecified: Secondary | ICD-10-CM | POA: Diagnosis not present

## 2017-03-01 DIAGNOSIS — E785 Hyperlipidemia, unspecified: Secondary | ICD-10-CM | POA: Diagnosis not present

## 2017-03-01 DIAGNOSIS — F0280 Dementia in other diseases classified elsewhere without behavioral disturbance: Secondary | ICD-10-CM | POA: Diagnosis not present

## 2017-03-02 DIAGNOSIS — Z9981 Dependence on supplemental oxygen: Secondary | ICD-10-CM | POA: Diagnosis not present

## 2017-03-02 DIAGNOSIS — G309 Alzheimer's disease, unspecified: Secondary | ICD-10-CM | POA: Diagnosis not present

## 2017-03-02 DIAGNOSIS — E785 Hyperlipidemia, unspecified: Secondary | ICD-10-CM | POA: Diagnosis not present

## 2017-03-02 DIAGNOSIS — I10 Essential (primary) hypertension: Secondary | ICD-10-CM | POA: Diagnosis not present

## 2017-03-02 DIAGNOSIS — J449 Chronic obstructive pulmonary disease, unspecified: Secondary | ICD-10-CM | POA: Diagnosis not present

## 2017-03-02 DIAGNOSIS — F0280 Dementia in other diseases classified elsewhere without behavioral disturbance: Secondary | ICD-10-CM | POA: Diagnosis not present

## 2017-03-03 DIAGNOSIS — J449 Chronic obstructive pulmonary disease, unspecified: Secondary | ICD-10-CM | POA: Diagnosis not present

## 2017-03-03 DIAGNOSIS — E785 Hyperlipidemia, unspecified: Secondary | ICD-10-CM | POA: Diagnosis not present

## 2017-03-03 DIAGNOSIS — I10 Essential (primary) hypertension: Secondary | ICD-10-CM | POA: Diagnosis not present

## 2017-03-03 DIAGNOSIS — Z9981 Dependence on supplemental oxygen: Secondary | ICD-10-CM | POA: Diagnosis not present

## 2017-03-03 DIAGNOSIS — G309 Alzheimer's disease, unspecified: Secondary | ICD-10-CM | POA: Diagnosis not present

## 2017-03-03 DIAGNOSIS — F0280 Dementia in other diseases classified elsewhere without behavioral disturbance: Secondary | ICD-10-CM | POA: Diagnosis not present

## 2017-03-06 DIAGNOSIS — G309 Alzheimer's disease, unspecified: Secondary | ICD-10-CM | POA: Diagnosis not present

## 2017-03-06 DIAGNOSIS — F0280 Dementia in other diseases classified elsewhere without behavioral disturbance: Secondary | ICD-10-CM | POA: Diagnosis not present

## 2017-03-06 DIAGNOSIS — Z9981 Dependence on supplemental oxygen: Secondary | ICD-10-CM | POA: Diagnosis not present

## 2017-03-06 DIAGNOSIS — I10 Essential (primary) hypertension: Secondary | ICD-10-CM | POA: Diagnosis not present

## 2017-03-06 DIAGNOSIS — E785 Hyperlipidemia, unspecified: Secondary | ICD-10-CM | POA: Diagnosis not present

## 2017-03-06 DIAGNOSIS — J449 Chronic obstructive pulmonary disease, unspecified: Secondary | ICD-10-CM | POA: Diagnosis not present

## 2017-03-07 DIAGNOSIS — E785 Hyperlipidemia, unspecified: Secondary | ICD-10-CM | POA: Diagnosis not present

## 2017-03-07 DIAGNOSIS — Z9981 Dependence on supplemental oxygen: Secondary | ICD-10-CM | POA: Diagnosis not present

## 2017-03-07 DIAGNOSIS — F0280 Dementia in other diseases classified elsewhere without behavioral disturbance: Secondary | ICD-10-CM | POA: Diagnosis not present

## 2017-03-07 DIAGNOSIS — J449 Chronic obstructive pulmonary disease, unspecified: Secondary | ICD-10-CM | POA: Diagnosis not present

## 2017-03-07 DIAGNOSIS — I10 Essential (primary) hypertension: Secondary | ICD-10-CM | POA: Diagnosis not present

## 2017-03-07 DIAGNOSIS — G309 Alzheimer's disease, unspecified: Secondary | ICD-10-CM | POA: Diagnosis not present

## 2017-03-08 DIAGNOSIS — I10 Essential (primary) hypertension: Secondary | ICD-10-CM | POA: Diagnosis not present

## 2017-03-08 DIAGNOSIS — G309 Alzheimer's disease, unspecified: Secondary | ICD-10-CM | POA: Diagnosis not present

## 2017-03-08 DIAGNOSIS — E785 Hyperlipidemia, unspecified: Secondary | ICD-10-CM | POA: Diagnosis not present

## 2017-03-08 DIAGNOSIS — Z9981 Dependence on supplemental oxygen: Secondary | ICD-10-CM | POA: Diagnosis not present

## 2017-03-08 DIAGNOSIS — J449 Chronic obstructive pulmonary disease, unspecified: Secondary | ICD-10-CM | POA: Diagnosis not present

## 2017-03-08 DIAGNOSIS — F0280 Dementia in other diseases classified elsewhere without behavioral disturbance: Secondary | ICD-10-CM | POA: Diagnosis not present

## 2017-03-09 DIAGNOSIS — E785 Hyperlipidemia, unspecified: Secondary | ICD-10-CM | POA: Diagnosis not present

## 2017-03-09 DIAGNOSIS — F0280 Dementia in other diseases classified elsewhere without behavioral disturbance: Secondary | ICD-10-CM | POA: Diagnosis not present

## 2017-03-09 DIAGNOSIS — Z9981 Dependence on supplemental oxygen: Secondary | ICD-10-CM | POA: Diagnosis not present

## 2017-03-09 DIAGNOSIS — G309 Alzheimer's disease, unspecified: Secondary | ICD-10-CM | POA: Diagnosis not present

## 2017-03-09 DIAGNOSIS — J449 Chronic obstructive pulmonary disease, unspecified: Secondary | ICD-10-CM | POA: Diagnosis not present

## 2017-03-09 DIAGNOSIS — I10 Essential (primary) hypertension: Secondary | ICD-10-CM | POA: Diagnosis not present

## 2017-03-10 DIAGNOSIS — E785 Hyperlipidemia, unspecified: Secondary | ICD-10-CM | POA: Diagnosis not present

## 2017-03-10 DIAGNOSIS — G309 Alzheimer's disease, unspecified: Secondary | ICD-10-CM | POA: Diagnosis not present

## 2017-03-10 DIAGNOSIS — J449 Chronic obstructive pulmonary disease, unspecified: Secondary | ICD-10-CM | POA: Diagnosis not present

## 2017-03-10 DIAGNOSIS — I10 Essential (primary) hypertension: Secondary | ICD-10-CM | POA: Diagnosis not present

## 2017-03-10 DIAGNOSIS — Z9981 Dependence on supplemental oxygen: Secondary | ICD-10-CM | POA: Diagnosis not present

## 2017-03-10 DIAGNOSIS — F0280 Dementia in other diseases classified elsewhere without behavioral disturbance: Secondary | ICD-10-CM | POA: Diagnosis not present

## 2017-03-13 DIAGNOSIS — F0280 Dementia in other diseases classified elsewhere without behavioral disturbance: Secondary | ICD-10-CM | POA: Diagnosis not present

## 2017-03-13 DIAGNOSIS — G309 Alzheimer's disease, unspecified: Secondary | ICD-10-CM | POA: Diagnosis not present

## 2017-03-13 DIAGNOSIS — E785 Hyperlipidemia, unspecified: Secondary | ICD-10-CM | POA: Diagnosis not present

## 2017-03-13 DIAGNOSIS — J449 Chronic obstructive pulmonary disease, unspecified: Secondary | ICD-10-CM | POA: Diagnosis not present

## 2017-03-13 DIAGNOSIS — Z9981 Dependence on supplemental oxygen: Secondary | ICD-10-CM | POA: Diagnosis not present

## 2017-03-13 DIAGNOSIS — I10 Essential (primary) hypertension: Secondary | ICD-10-CM | POA: Diagnosis not present

## 2017-03-14 DIAGNOSIS — Z9981 Dependence on supplemental oxygen: Secondary | ICD-10-CM | POA: Diagnosis not present

## 2017-03-14 DIAGNOSIS — I10 Essential (primary) hypertension: Secondary | ICD-10-CM | POA: Diagnosis not present

## 2017-03-14 DIAGNOSIS — F0280 Dementia in other diseases classified elsewhere without behavioral disturbance: Secondary | ICD-10-CM | POA: Diagnosis not present

## 2017-03-14 DIAGNOSIS — E785 Hyperlipidemia, unspecified: Secondary | ICD-10-CM | POA: Diagnosis not present

## 2017-03-14 DIAGNOSIS — G309 Alzheimer's disease, unspecified: Secondary | ICD-10-CM | POA: Diagnosis not present

## 2017-03-14 DIAGNOSIS — J449 Chronic obstructive pulmonary disease, unspecified: Secondary | ICD-10-CM | POA: Diagnosis not present

## 2017-03-15 DIAGNOSIS — J449 Chronic obstructive pulmonary disease, unspecified: Secondary | ICD-10-CM | POA: Diagnosis not present

## 2017-03-15 DIAGNOSIS — Z9981 Dependence on supplemental oxygen: Secondary | ICD-10-CM | POA: Diagnosis not present

## 2017-03-15 DIAGNOSIS — I10 Essential (primary) hypertension: Secondary | ICD-10-CM | POA: Diagnosis not present

## 2017-03-15 DIAGNOSIS — E785 Hyperlipidemia, unspecified: Secondary | ICD-10-CM | POA: Diagnosis not present

## 2017-03-15 DIAGNOSIS — G309 Alzheimer's disease, unspecified: Secondary | ICD-10-CM | POA: Diagnosis not present

## 2017-03-15 DIAGNOSIS — F0280 Dementia in other diseases classified elsewhere without behavioral disturbance: Secondary | ICD-10-CM | POA: Diagnosis not present

## 2017-03-16 DIAGNOSIS — I10 Essential (primary) hypertension: Secondary | ICD-10-CM | POA: Diagnosis not present

## 2017-03-16 DIAGNOSIS — G309 Alzheimer's disease, unspecified: Secondary | ICD-10-CM | POA: Diagnosis not present

## 2017-03-16 DIAGNOSIS — F0280 Dementia in other diseases classified elsewhere without behavioral disturbance: Secondary | ICD-10-CM | POA: Diagnosis not present

## 2017-03-16 DIAGNOSIS — Z9981 Dependence on supplemental oxygen: Secondary | ICD-10-CM | POA: Diagnosis not present

## 2017-03-16 DIAGNOSIS — E785 Hyperlipidemia, unspecified: Secondary | ICD-10-CM | POA: Diagnosis not present

## 2017-03-16 DIAGNOSIS — Z85820 Personal history of malignant melanoma of skin: Secondary | ICD-10-CM | POA: Diagnosis not present

## 2017-03-16 DIAGNOSIS — J449 Chronic obstructive pulmonary disease, unspecified: Secondary | ICD-10-CM | POA: Diagnosis not present

## 2017-03-16 DIAGNOSIS — H409 Unspecified glaucoma: Secondary | ICD-10-CM | POA: Diagnosis not present

## 2017-03-17 DIAGNOSIS — I10 Essential (primary) hypertension: Secondary | ICD-10-CM | POA: Diagnosis not present

## 2017-03-17 DIAGNOSIS — J449 Chronic obstructive pulmonary disease, unspecified: Secondary | ICD-10-CM | POA: Diagnosis not present

## 2017-03-17 DIAGNOSIS — Z9981 Dependence on supplemental oxygen: Secondary | ICD-10-CM | POA: Diagnosis not present

## 2017-03-17 DIAGNOSIS — G309 Alzheimer's disease, unspecified: Secondary | ICD-10-CM | POA: Diagnosis not present

## 2017-03-17 DIAGNOSIS — F0280 Dementia in other diseases classified elsewhere without behavioral disturbance: Secondary | ICD-10-CM | POA: Diagnosis not present

## 2017-03-17 DIAGNOSIS — E785 Hyperlipidemia, unspecified: Secondary | ICD-10-CM | POA: Diagnosis not present

## 2017-03-20 DIAGNOSIS — E785 Hyperlipidemia, unspecified: Secondary | ICD-10-CM | POA: Diagnosis not present

## 2017-03-20 DIAGNOSIS — G309 Alzheimer's disease, unspecified: Secondary | ICD-10-CM | POA: Diagnosis not present

## 2017-03-20 DIAGNOSIS — J449 Chronic obstructive pulmonary disease, unspecified: Secondary | ICD-10-CM | POA: Diagnosis not present

## 2017-03-20 DIAGNOSIS — Z9981 Dependence on supplemental oxygen: Secondary | ICD-10-CM | POA: Diagnosis not present

## 2017-03-20 DIAGNOSIS — F0280 Dementia in other diseases classified elsewhere without behavioral disturbance: Secondary | ICD-10-CM | POA: Diagnosis not present

## 2017-03-20 DIAGNOSIS — I10 Essential (primary) hypertension: Secondary | ICD-10-CM | POA: Diagnosis not present

## 2017-03-21 DIAGNOSIS — G309 Alzheimer's disease, unspecified: Secondary | ICD-10-CM | POA: Diagnosis not present

## 2017-03-21 DIAGNOSIS — E785 Hyperlipidemia, unspecified: Secondary | ICD-10-CM | POA: Diagnosis not present

## 2017-03-21 DIAGNOSIS — F0280 Dementia in other diseases classified elsewhere without behavioral disturbance: Secondary | ICD-10-CM | POA: Diagnosis not present

## 2017-03-21 DIAGNOSIS — J449 Chronic obstructive pulmonary disease, unspecified: Secondary | ICD-10-CM | POA: Diagnosis not present

## 2017-03-21 DIAGNOSIS — Z9981 Dependence on supplemental oxygen: Secondary | ICD-10-CM | POA: Diagnosis not present

## 2017-03-21 DIAGNOSIS — I10 Essential (primary) hypertension: Secondary | ICD-10-CM | POA: Diagnosis not present

## 2017-03-22 DIAGNOSIS — J449 Chronic obstructive pulmonary disease, unspecified: Secondary | ICD-10-CM | POA: Diagnosis not present

## 2017-03-22 DIAGNOSIS — Z9981 Dependence on supplemental oxygen: Secondary | ICD-10-CM | POA: Diagnosis not present

## 2017-03-22 DIAGNOSIS — F0280 Dementia in other diseases classified elsewhere without behavioral disturbance: Secondary | ICD-10-CM | POA: Diagnosis not present

## 2017-03-22 DIAGNOSIS — G309 Alzheimer's disease, unspecified: Secondary | ICD-10-CM | POA: Diagnosis not present

## 2017-03-22 DIAGNOSIS — E785 Hyperlipidemia, unspecified: Secondary | ICD-10-CM | POA: Diagnosis not present

## 2017-03-22 DIAGNOSIS — I10 Essential (primary) hypertension: Secondary | ICD-10-CM | POA: Diagnosis not present

## 2017-03-23 DIAGNOSIS — G309 Alzheimer's disease, unspecified: Secondary | ICD-10-CM | POA: Diagnosis not present

## 2017-03-23 DIAGNOSIS — J449 Chronic obstructive pulmonary disease, unspecified: Secondary | ICD-10-CM | POA: Diagnosis not present

## 2017-03-23 DIAGNOSIS — Z9981 Dependence on supplemental oxygen: Secondary | ICD-10-CM | POA: Diagnosis not present

## 2017-03-23 DIAGNOSIS — F0280 Dementia in other diseases classified elsewhere without behavioral disturbance: Secondary | ICD-10-CM | POA: Diagnosis not present

## 2017-03-23 DIAGNOSIS — E785 Hyperlipidemia, unspecified: Secondary | ICD-10-CM | POA: Diagnosis not present

## 2017-03-23 DIAGNOSIS — I10 Essential (primary) hypertension: Secondary | ICD-10-CM | POA: Diagnosis not present

## 2017-03-24 DIAGNOSIS — F0280 Dementia in other diseases classified elsewhere without behavioral disturbance: Secondary | ICD-10-CM | POA: Diagnosis not present

## 2017-03-24 DIAGNOSIS — G309 Alzheimer's disease, unspecified: Secondary | ICD-10-CM | POA: Diagnosis not present

## 2017-03-24 DIAGNOSIS — J449 Chronic obstructive pulmonary disease, unspecified: Secondary | ICD-10-CM | POA: Diagnosis not present

## 2017-03-24 DIAGNOSIS — E785 Hyperlipidemia, unspecified: Secondary | ICD-10-CM | POA: Diagnosis not present

## 2017-03-24 DIAGNOSIS — Z9981 Dependence on supplemental oxygen: Secondary | ICD-10-CM | POA: Diagnosis not present

## 2017-03-24 DIAGNOSIS — I10 Essential (primary) hypertension: Secondary | ICD-10-CM | POA: Diagnosis not present

## 2017-03-27 DIAGNOSIS — J449 Chronic obstructive pulmonary disease, unspecified: Secondary | ICD-10-CM | POA: Diagnosis not present

## 2017-03-27 DIAGNOSIS — E785 Hyperlipidemia, unspecified: Secondary | ICD-10-CM | POA: Diagnosis not present

## 2017-03-27 DIAGNOSIS — F0280 Dementia in other diseases classified elsewhere without behavioral disturbance: Secondary | ICD-10-CM | POA: Diagnosis not present

## 2017-03-27 DIAGNOSIS — I10 Essential (primary) hypertension: Secondary | ICD-10-CM | POA: Diagnosis not present

## 2017-03-27 DIAGNOSIS — G309 Alzheimer's disease, unspecified: Secondary | ICD-10-CM | POA: Diagnosis not present

## 2017-03-27 DIAGNOSIS — Z9981 Dependence on supplemental oxygen: Secondary | ICD-10-CM | POA: Diagnosis not present

## 2017-03-28 DIAGNOSIS — Z9981 Dependence on supplemental oxygen: Secondary | ICD-10-CM | POA: Diagnosis not present

## 2017-03-28 DIAGNOSIS — E785 Hyperlipidemia, unspecified: Secondary | ICD-10-CM | POA: Diagnosis not present

## 2017-03-28 DIAGNOSIS — J449 Chronic obstructive pulmonary disease, unspecified: Secondary | ICD-10-CM | POA: Diagnosis not present

## 2017-03-28 DIAGNOSIS — F0280 Dementia in other diseases classified elsewhere without behavioral disturbance: Secondary | ICD-10-CM | POA: Diagnosis not present

## 2017-03-28 DIAGNOSIS — I10 Essential (primary) hypertension: Secondary | ICD-10-CM | POA: Diagnosis not present

## 2017-03-28 DIAGNOSIS — G309 Alzheimer's disease, unspecified: Secondary | ICD-10-CM | POA: Diagnosis not present

## 2017-03-29 DIAGNOSIS — J449 Chronic obstructive pulmonary disease, unspecified: Secondary | ICD-10-CM | POA: Diagnosis not present

## 2017-03-29 DIAGNOSIS — I10 Essential (primary) hypertension: Secondary | ICD-10-CM | POA: Diagnosis not present

## 2017-03-29 DIAGNOSIS — F0280 Dementia in other diseases classified elsewhere without behavioral disturbance: Secondary | ICD-10-CM | POA: Diagnosis not present

## 2017-03-29 DIAGNOSIS — E785 Hyperlipidemia, unspecified: Secondary | ICD-10-CM | POA: Diagnosis not present

## 2017-03-29 DIAGNOSIS — G309 Alzheimer's disease, unspecified: Secondary | ICD-10-CM | POA: Diagnosis not present

## 2017-03-29 DIAGNOSIS — Z9981 Dependence on supplemental oxygen: Secondary | ICD-10-CM | POA: Diagnosis not present

## 2017-03-30 DIAGNOSIS — I10 Essential (primary) hypertension: Secondary | ICD-10-CM | POA: Diagnosis not present

## 2017-03-30 DIAGNOSIS — J449 Chronic obstructive pulmonary disease, unspecified: Secondary | ICD-10-CM | POA: Diagnosis not present

## 2017-03-30 DIAGNOSIS — E785 Hyperlipidemia, unspecified: Secondary | ICD-10-CM | POA: Diagnosis not present

## 2017-03-30 DIAGNOSIS — F0280 Dementia in other diseases classified elsewhere without behavioral disturbance: Secondary | ICD-10-CM | POA: Diagnosis not present

## 2017-03-30 DIAGNOSIS — G309 Alzheimer's disease, unspecified: Secondary | ICD-10-CM | POA: Diagnosis not present

## 2017-03-30 DIAGNOSIS — Z9981 Dependence on supplemental oxygen: Secondary | ICD-10-CM | POA: Diagnosis not present

## 2017-03-31 DIAGNOSIS — F0280 Dementia in other diseases classified elsewhere without behavioral disturbance: Secondary | ICD-10-CM | POA: Diagnosis not present

## 2017-03-31 DIAGNOSIS — I10 Essential (primary) hypertension: Secondary | ICD-10-CM | POA: Diagnosis not present

## 2017-03-31 DIAGNOSIS — Z9981 Dependence on supplemental oxygen: Secondary | ICD-10-CM | POA: Diagnosis not present

## 2017-03-31 DIAGNOSIS — J449 Chronic obstructive pulmonary disease, unspecified: Secondary | ICD-10-CM | POA: Diagnosis not present

## 2017-03-31 DIAGNOSIS — G309 Alzheimer's disease, unspecified: Secondary | ICD-10-CM | POA: Diagnosis not present

## 2017-03-31 DIAGNOSIS — E785 Hyperlipidemia, unspecified: Secondary | ICD-10-CM | POA: Diagnosis not present

## 2017-04-03 DIAGNOSIS — Z9981 Dependence on supplemental oxygen: Secondary | ICD-10-CM | POA: Diagnosis not present

## 2017-04-03 DIAGNOSIS — I10 Essential (primary) hypertension: Secondary | ICD-10-CM | POA: Diagnosis not present

## 2017-04-03 DIAGNOSIS — E785 Hyperlipidemia, unspecified: Secondary | ICD-10-CM | POA: Diagnosis not present

## 2017-04-03 DIAGNOSIS — F0280 Dementia in other diseases classified elsewhere without behavioral disturbance: Secondary | ICD-10-CM | POA: Diagnosis not present

## 2017-04-03 DIAGNOSIS — G309 Alzheimer's disease, unspecified: Secondary | ICD-10-CM | POA: Diagnosis not present

## 2017-04-03 DIAGNOSIS — J449 Chronic obstructive pulmonary disease, unspecified: Secondary | ICD-10-CM | POA: Diagnosis not present

## 2017-04-04 DIAGNOSIS — Z9981 Dependence on supplemental oxygen: Secondary | ICD-10-CM | POA: Diagnosis not present

## 2017-04-04 DIAGNOSIS — G309 Alzheimer's disease, unspecified: Secondary | ICD-10-CM | POA: Diagnosis not present

## 2017-04-04 DIAGNOSIS — I10 Essential (primary) hypertension: Secondary | ICD-10-CM | POA: Diagnosis not present

## 2017-04-04 DIAGNOSIS — J449 Chronic obstructive pulmonary disease, unspecified: Secondary | ICD-10-CM | POA: Diagnosis not present

## 2017-04-04 DIAGNOSIS — E785 Hyperlipidemia, unspecified: Secondary | ICD-10-CM | POA: Diagnosis not present

## 2017-04-04 DIAGNOSIS — F0280 Dementia in other diseases classified elsewhere without behavioral disturbance: Secondary | ICD-10-CM | POA: Diagnosis not present

## 2017-04-05 DIAGNOSIS — Z9981 Dependence on supplemental oxygen: Secondary | ICD-10-CM | POA: Diagnosis not present

## 2017-04-05 DIAGNOSIS — J449 Chronic obstructive pulmonary disease, unspecified: Secondary | ICD-10-CM | POA: Diagnosis not present

## 2017-04-05 DIAGNOSIS — G309 Alzheimer's disease, unspecified: Secondary | ICD-10-CM | POA: Diagnosis not present

## 2017-04-05 DIAGNOSIS — E785 Hyperlipidemia, unspecified: Secondary | ICD-10-CM | POA: Diagnosis not present

## 2017-04-05 DIAGNOSIS — F0280 Dementia in other diseases classified elsewhere without behavioral disturbance: Secondary | ICD-10-CM | POA: Diagnosis not present

## 2017-04-05 DIAGNOSIS — I10 Essential (primary) hypertension: Secondary | ICD-10-CM | POA: Diagnosis not present

## 2017-04-06 DIAGNOSIS — F0280 Dementia in other diseases classified elsewhere without behavioral disturbance: Secondary | ICD-10-CM | POA: Diagnosis not present

## 2017-04-06 DIAGNOSIS — Z9981 Dependence on supplemental oxygen: Secondary | ICD-10-CM | POA: Diagnosis not present

## 2017-04-06 DIAGNOSIS — G309 Alzheimer's disease, unspecified: Secondary | ICD-10-CM | POA: Diagnosis not present

## 2017-04-06 DIAGNOSIS — J449 Chronic obstructive pulmonary disease, unspecified: Secondary | ICD-10-CM | POA: Diagnosis not present

## 2017-04-06 DIAGNOSIS — I10 Essential (primary) hypertension: Secondary | ICD-10-CM | POA: Diagnosis not present

## 2017-04-06 DIAGNOSIS — E785 Hyperlipidemia, unspecified: Secondary | ICD-10-CM | POA: Diagnosis not present

## 2017-04-07 DIAGNOSIS — I10 Essential (primary) hypertension: Secondary | ICD-10-CM | POA: Diagnosis not present

## 2017-04-07 DIAGNOSIS — G309 Alzheimer's disease, unspecified: Secondary | ICD-10-CM | POA: Diagnosis not present

## 2017-04-07 DIAGNOSIS — E785 Hyperlipidemia, unspecified: Secondary | ICD-10-CM | POA: Diagnosis not present

## 2017-04-07 DIAGNOSIS — Z9981 Dependence on supplemental oxygen: Secondary | ICD-10-CM | POA: Diagnosis not present

## 2017-04-07 DIAGNOSIS — J449 Chronic obstructive pulmonary disease, unspecified: Secondary | ICD-10-CM | POA: Diagnosis not present

## 2017-04-07 DIAGNOSIS — F0280 Dementia in other diseases classified elsewhere without behavioral disturbance: Secondary | ICD-10-CM | POA: Diagnosis not present

## 2017-04-10 DIAGNOSIS — Z9981 Dependence on supplemental oxygen: Secondary | ICD-10-CM | POA: Diagnosis not present

## 2017-04-10 DIAGNOSIS — E785 Hyperlipidemia, unspecified: Secondary | ICD-10-CM | POA: Diagnosis not present

## 2017-04-10 DIAGNOSIS — G309 Alzheimer's disease, unspecified: Secondary | ICD-10-CM | POA: Diagnosis not present

## 2017-04-10 DIAGNOSIS — F0280 Dementia in other diseases classified elsewhere without behavioral disturbance: Secondary | ICD-10-CM | POA: Diagnosis not present

## 2017-04-10 DIAGNOSIS — I10 Essential (primary) hypertension: Secondary | ICD-10-CM | POA: Diagnosis not present

## 2017-04-10 DIAGNOSIS — J449 Chronic obstructive pulmonary disease, unspecified: Secondary | ICD-10-CM | POA: Diagnosis not present

## 2017-04-11 DIAGNOSIS — J449 Chronic obstructive pulmonary disease, unspecified: Secondary | ICD-10-CM | POA: Diagnosis not present

## 2017-04-11 DIAGNOSIS — I10 Essential (primary) hypertension: Secondary | ICD-10-CM | POA: Diagnosis not present

## 2017-04-11 DIAGNOSIS — F0280 Dementia in other diseases classified elsewhere without behavioral disturbance: Secondary | ICD-10-CM | POA: Diagnosis not present

## 2017-04-11 DIAGNOSIS — G309 Alzheimer's disease, unspecified: Secondary | ICD-10-CM | POA: Diagnosis not present

## 2017-04-11 DIAGNOSIS — Z9981 Dependence on supplemental oxygen: Secondary | ICD-10-CM | POA: Diagnosis not present

## 2017-04-11 DIAGNOSIS — E785 Hyperlipidemia, unspecified: Secondary | ICD-10-CM | POA: Diagnosis not present

## 2017-04-12 DIAGNOSIS — G309 Alzheimer's disease, unspecified: Secondary | ICD-10-CM | POA: Diagnosis not present

## 2017-04-12 DIAGNOSIS — E785 Hyperlipidemia, unspecified: Secondary | ICD-10-CM | POA: Diagnosis not present

## 2017-04-12 DIAGNOSIS — I10 Essential (primary) hypertension: Secondary | ICD-10-CM | POA: Diagnosis not present

## 2017-04-12 DIAGNOSIS — F0280 Dementia in other diseases classified elsewhere without behavioral disturbance: Secondary | ICD-10-CM | POA: Diagnosis not present

## 2017-04-12 DIAGNOSIS — J449 Chronic obstructive pulmonary disease, unspecified: Secondary | ICD-10-CM | POA: Diagnosis not present

## 2017-04-12 DIAGNOSIS — Z9981 Dependence on supplemental oxygen: Secondary | ICD-10-CM | POA: Diagnosis not present

## 2017-04-13 DIAGNOSIS — F0280 Dementia in other diseases classified elsewhere without behavioral disturbance: Secondary | ICD-10-CM | POA: Diagnosis not present

## 2017-04-13 DIAGNOSIS — I10 Essential (primary) hypertension: Secondary | ICD-10-CM | POA: Diagnosis not present

## 2017-04-13 DIAGNOSIS — G309 Alzheimer's disease, unspecified: Secondary | ICD-10-CM | POA: Diagnosis not present

## 2017-04-13 DIAGNOSIS — J449 Chronic obstructive pulmonary disease, unspecified: Secondary | ICD-10-CM | POA: Diagnosis not present

## 2017-04-13 DIAGNOSIS — Z9981 Dependence on supplemental oxygen: Secondary | ICD-10-CM | POA: Diagnosis not present

## 2017-04-13 DIAGNOSIS — E785 Hyperlipidemia, unspecified: Secondary | ICD-10-CM | POA: Diagnosis not present

## 2017-04-14 ENCOUNTER — Encounter (INDEPENDENT_AMBULATORY_CARE_PROVIDER_SITE_OTHER): Payer: Medicare Other | Admitting: Family Medicine

## 2017-04-14 ENCOUNTER — Telehealth: Payer: Self-pay | Admitting: Family Medicine

## 2017-04-14 DIAGNOSIS — Z9981 Dependence on supplemental oxygen: Secondary | ICD-10-CM

## 2017-04-14 DIAGNOSIS — G309 Alzheimer's disease, unspecified: Secondary | ICD-10-CM | POA: Diagnosis not present

## 2017-04-14 DIAGNOSIS — F0280 Dementia in other diseases classified elsewhere without behavioral disturbance: Secondary | ICD-10-CM

## 2017-04-14 DIAGNOSIS — J449 Chronic obstructive pulmonary disease, unspecified: Secondary | ICD-10-CM

## 2017-04-14 DIAGNOSIS — I10 Essential (primary) hypertension: Secondary | ICD-10-CM | POA: Diagnosis not present

## 2017-04-14 DIAGNOSIS — E785 Hyperlipidemia, unspecified: Secondary | ICD-10-CM | POA: Diagnosis not present

## 2017-04-14 NOTE — Telephone Encounter (Signed)
Please advise 

## 2017-04-14 NOTE — Telephone Encounter (Signed)
Rx faxed

## 2017-04-14 NOTE — Telephone Encounter (Signed)
Sherry with Hospice is requesting a written order faxed to [email or web address] [phone] to help with a clear runny nose.  Pt seems to have this all the time.  SX#[phone]

## 2017-04-14 NOTE — Telephone Encounter (Signed)
Can send order for OTC loratadine 10mg  once a day.

## 2017-04-15 DIAGNOSIS — Z9981 Dependence on supplemental oxygen: Secondary | ICD-10-CM | POA: Diagnosis not present

## 2017-04-15 DIAGNOSIS — Z85820 Personal history of malignant melanoma of skin: Secondary | ICD-10-CM | POA: Diagnosis not present

## 2017-04-15 DIAGNOSIS — F0280 Dementia in other diseases classified elsewhere without behavioral disturbance: Secondary | ICD-10-CM | POA: Diagnosis not present

## 2017-04-15 DIAGNOSIS — J449 Chronic obstructive pulmonary disease, unspecified: Secondary | ICD-10-CM | POA: Diagnosis not present

## 2017-04-15 DIAGNOSIS — G309 Alzheimer's disease, unspecified: Secondary | ICD-10-CM | POA: Diagnosis not present

## 2017-04-15 DIAGNOSIS — I10 Essential (primary) hypertension: Secondary | ICD-10-CM | POA: Diagnosis not present

## 2017-04-15 DIAGNOSIS — E785 Hyperlipidemia, unspecified: Secondary | ICD-10-CM | POA: Diagnosis not present

## 2017-04-15 DIAGNOSIS — H409 Unspecified glaucoma: Secondary | ICD-10-CM | POA: Diagnosis not present

## 2017-04-16 DIAGNOSIS — G309 Alzheimer's disease, unspecified: Secondary | ICD-10-CM | POA: Diagnosis not present

## 2017-04-16 DIAGNOSIS — E785 Hyperlipidemia, unspecified: Secondary | ICD-10-CM | POA: Diagnosis not present

## 2017-04-16 DIAGNOSIS — Z9981 Dependence on supplemental oxygen: Secondary | ICD-10-CM | POA: Diagnosis not present

## 2017-04-16 DIAGNOSIS — I10 Essential (primary) hypertension: Secondary | ICD-10-CM | POA: Diagnosis not present

## 2017-04-16 DIAGNOSIS — F0280 Dementia in other diseases classified elsewhere without behavioral disturbance: Secondary | ICD-10-CM | POA: Diagnosis not present

## 2017-04-16 DIAGNOSIS — J449 Chronic obstructive pulmonary disease, unspecified: Secondary | ICD-10-CM | POA: Diagnosis not present

## 2017-04-17 ENCOUNTER — Telehealth: Payer: Self-pay | Admitting: Family Medicine

## 2017-04-17 ENCOUNTER — Other Ambulatory Visit: Payer: Self-pay | Admitting: Family Medicine

## 2017-04-17 DIAGNOSIS — I10 Essential (primary) hypertension: Secondary | ICD-10-CM | POA: Diagnosis not present

## 2017-04-17 DIAGNOSIS — G309 Alzheimer's disease, unspecified: Secondary | ICD-10-CM | POA: Diagnosis not present

## 2017-04-17 DIAGNOSIS — E785 Hyperlipidemia, unspecified: Secondary | ICD-10-CM | POA: Diagnosis not present

## 2017-04-17 DIAGNOSIS — J449 Chronic obstructive pulmonary disease, unspecified: Secondary | ICD-10-CM | POA: Diagnosis not present

## 2017-04-17 DIAGNOSIS — Z9981 Dependence on supplemental oxygen: Secondary | ICD-10-CM | POA: Diagnosis not present

## 2017-04-17 DIAGNOSIS — F0280 Dementia in other diseases classified elsewhere without behavioral disturbance: Secondary | ICD-10-CM | POA: Diagnosis not present

## 2017-04-17 MED ORDER — MORPHINE SULFATE (CONCENTRATE) 20 MG/ML PO SOLN: ORAL | 0 refills | Status: DC

## 2017-04-17 MED ORDER — MORPHINE SULFATE (CONCENTRATE) 20 MG/ML PO SOLN: ORAL | 0 refills | Status: AC

## 2017-04-17 NOTE — Telephone Encounter (Signed)
Have sign order to increase morphine to Q 1 hour, please advise and fx. Thanks.

## 2017-04-17 NOTE — Telephone Encounter (Signed)
Stacy with Hospice is requesting a call back as soon as possible.  She states pt is really uncomfortable and she is needing orders changed.  [phone]

## 2017-04-17 NOTE — Telephone Encounter (Signed)
Order has been faxed to [facility].

## 2017-04-17 NOTE — Telephone Encounter (Signed)
Please review. Hospice nurse also called triage line early this morning. Hard copy is on your desk. Thanks!

## 2017-04-18 DIAGNOSIS — E785 Hyperlipidemia, unspecified: Secondary | ICD-10-CM | POA: Diagnosis not present

## 2017-04-18 DIAGNOSIS — J449 Chronic obstructive pulmonary disease, unspecified: Secondary | ICD-10-CM | POA: Diagnosis not present

## 2017-04-18 DIAGNOSIS — F0280 Dementia in other diseases classified elsewhere without behavioral disturbance: Secondary | ICD-10-CM | POA: Diagnosis not present

## 2017-04-18 DIAGNOSIS — I10 Essential (primary) hypertension: Secondary | ICD-10-CM | POA: Diagnosis not present

## 2017-04-18 DIAGNOSIS — Z9981 Dependence on supplemental oxygen: Secondary | ICD-10-CM | POA: Diagnosis not present

## 2017-04-18 DIAGNOSIS — G309 Alzheimer's disease, unspecified: Secondary | ICD-10-CM | POA: Diagnosis not present

## 2017-04-19 DIAGNOSIS — G309 Alzheimer's disease, unspecified: Secondary | ICD-10-CM | POA: Diagnosis not present

## 2017-04-19 DIAGNOSIS — J449 Chronic obstructive pulmonary disease, unspecified: Secondary | ICD-10-CM | POA: Diagnosis not present

## 2017-04-19 DIAGNOSIS — Z9981 Dependence on supplemental oxygen: Secondary | ICD-10-CM | POA: Diagnosis not present

## 2017-04-19 DIAGNOSIS — I10 Essential (primary) hypertension: Secondary | ICD-10-CM | POA: Diagnosis not present

## 2017-04-19 DIAGNOSIS — E785 Hyperlipidemia, unspecified: Secondary | ICD-10-CM | POA: Diagnosis not present

## 2017-04-19 DIAGNOSIS — F0280 Dementia in other diseases classified elsewhere without behavioral disturbance: Secondary | ICD-10-CM | POA: Diagnosis not present

## 2017-04-20 ENCOUNTER — Telehealth: Payer: Self-pay | Admitting: Family Medicine

## 2017-04-20 DIAGNOSIS — J449 Chronic obstructive pulmonary disease, unspecified: Secondary | ICD-10-CM | POA: Diagnosis not present

## 2017-04-20 DIAGNOSIS — E785 Hyperlipidemia, unspecified: Secondary | ICD-10-CM | POA: Diagnosis not present

## 2017-04-20 DIAGNOSIS — I10 Essential (primary) hypertension: Secondary | ICD-10-CM | POA: Diagnosis not present

## 2017-04-20 DIAGNOSIS — G309 Alzheimer's disease, unspecified: Secondary | ICD-10-CM | POA: Diagnosis not present

## 2017-04-20 DIAGNOSIS — Z9981 Dependence on supplemental oxygen: Secondary | ICD-10-CM | POA: Diagnosis not present

## 2017-04-20 DIAGNOSIS — F0280 Dementia in other diseases classified elsewhere without behavioral disturbance: Secondary | ICD-10-CM | POA: Diagnosis not present

## 2017-05-16 NOTE — Telephone Encounter (Signed)
Stacy with Hospice called to advise pt past away today at 2:02 PM/MW

## 2017-05-16 DEATH — deceased

## 2017-07-18 IMAGING — CR DG HIP (WITH OR WITHOUT PELVIS) 2-3V*R*
3 series · 3 of 3 positions shown · non-contrast
Comparison: None.

CLINICAL DATA: Right hip pain this morning, no known injury

EXAM:
DG HIP (WITH OR WITHOUT PELVIS) 2-3V RIGHT

[pelvis ap]
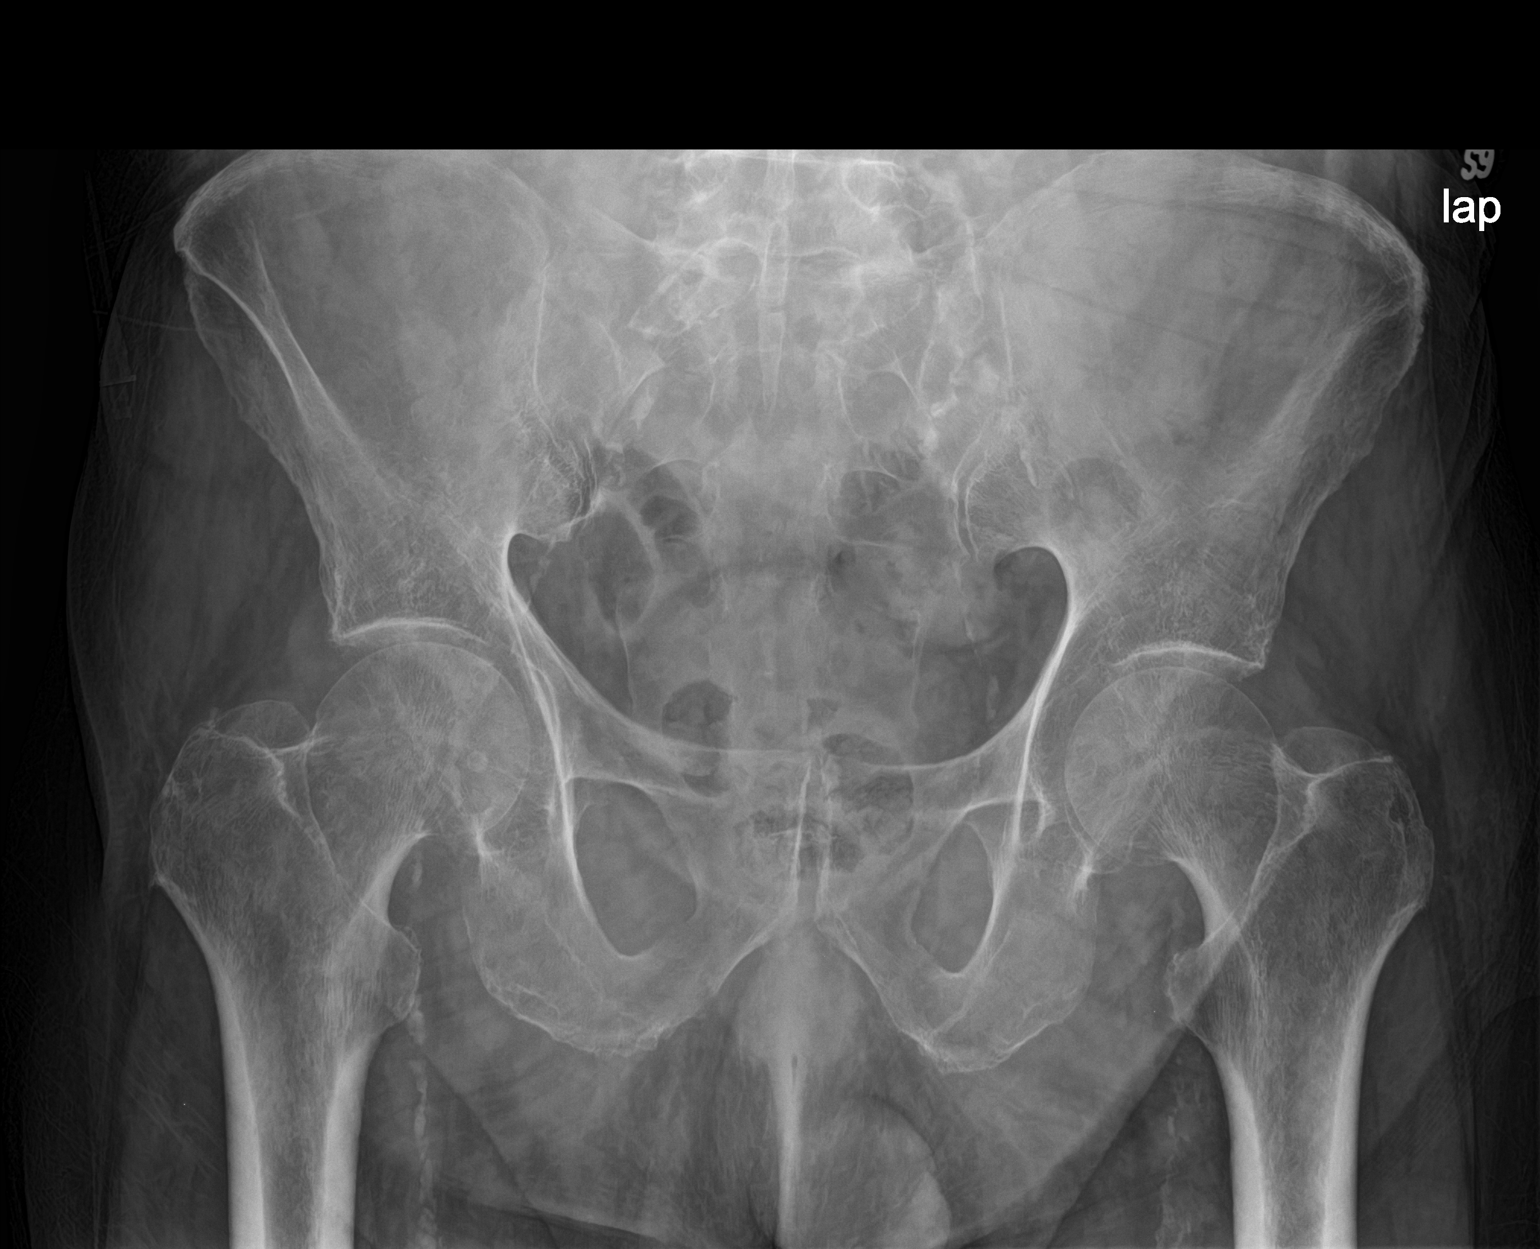

[hip ap]
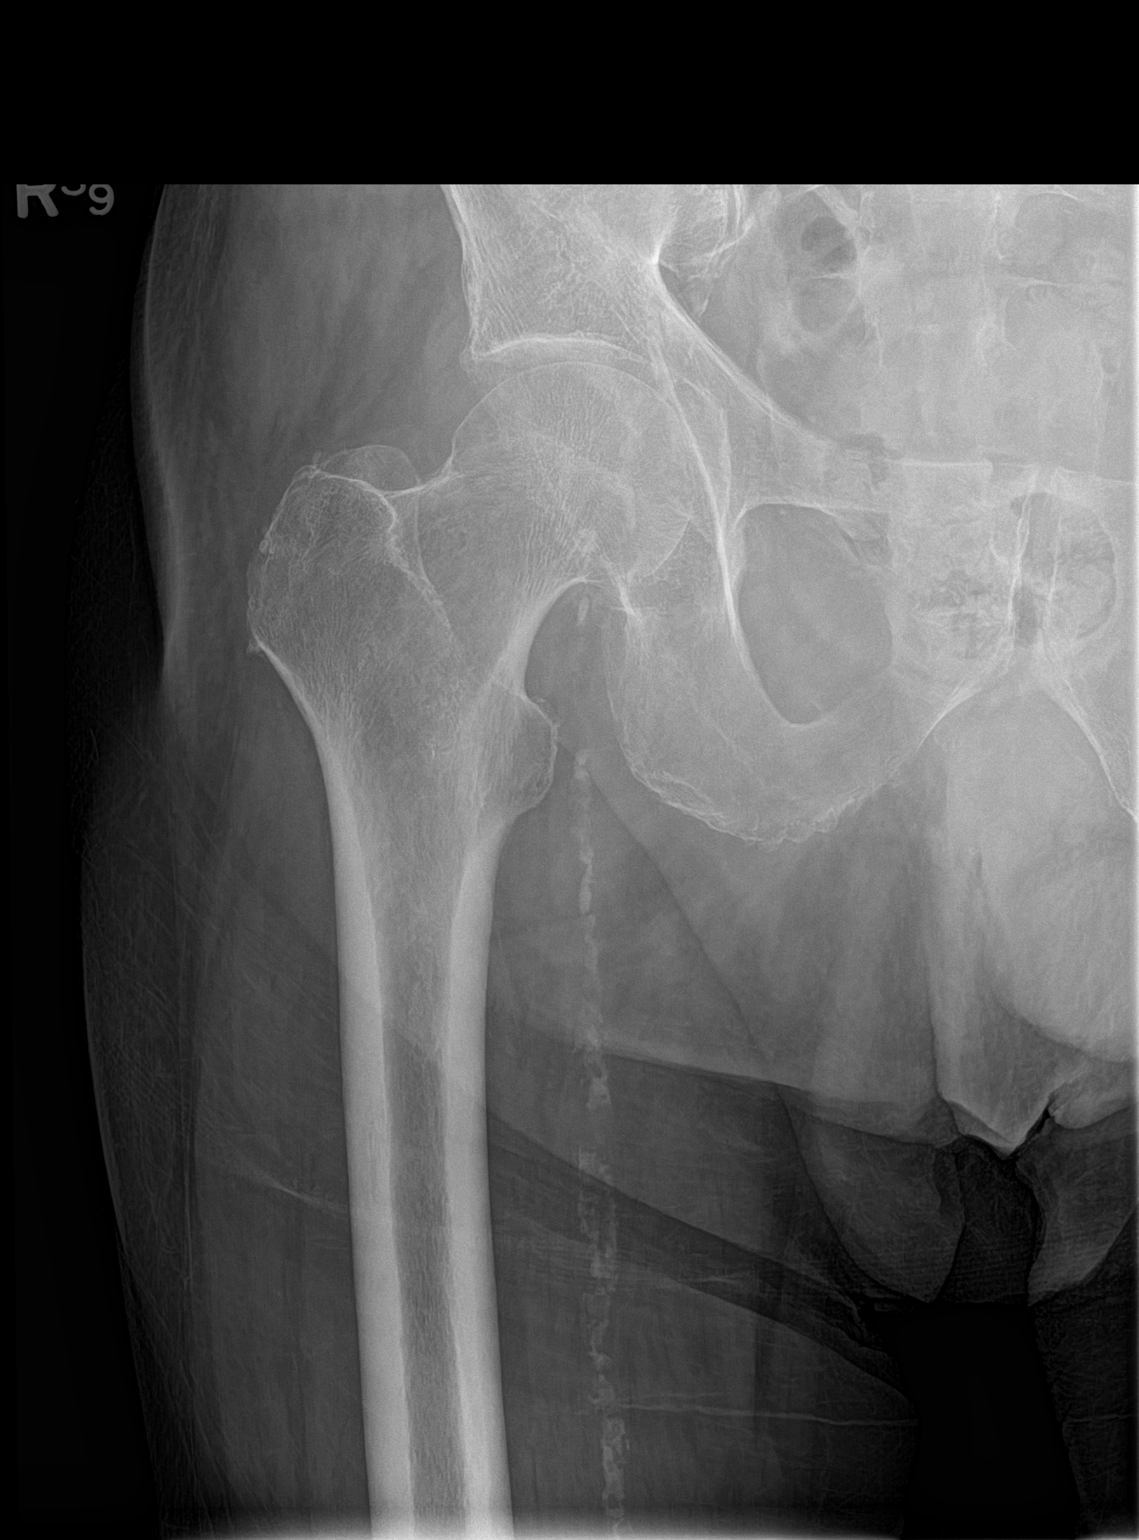

[hip lat]
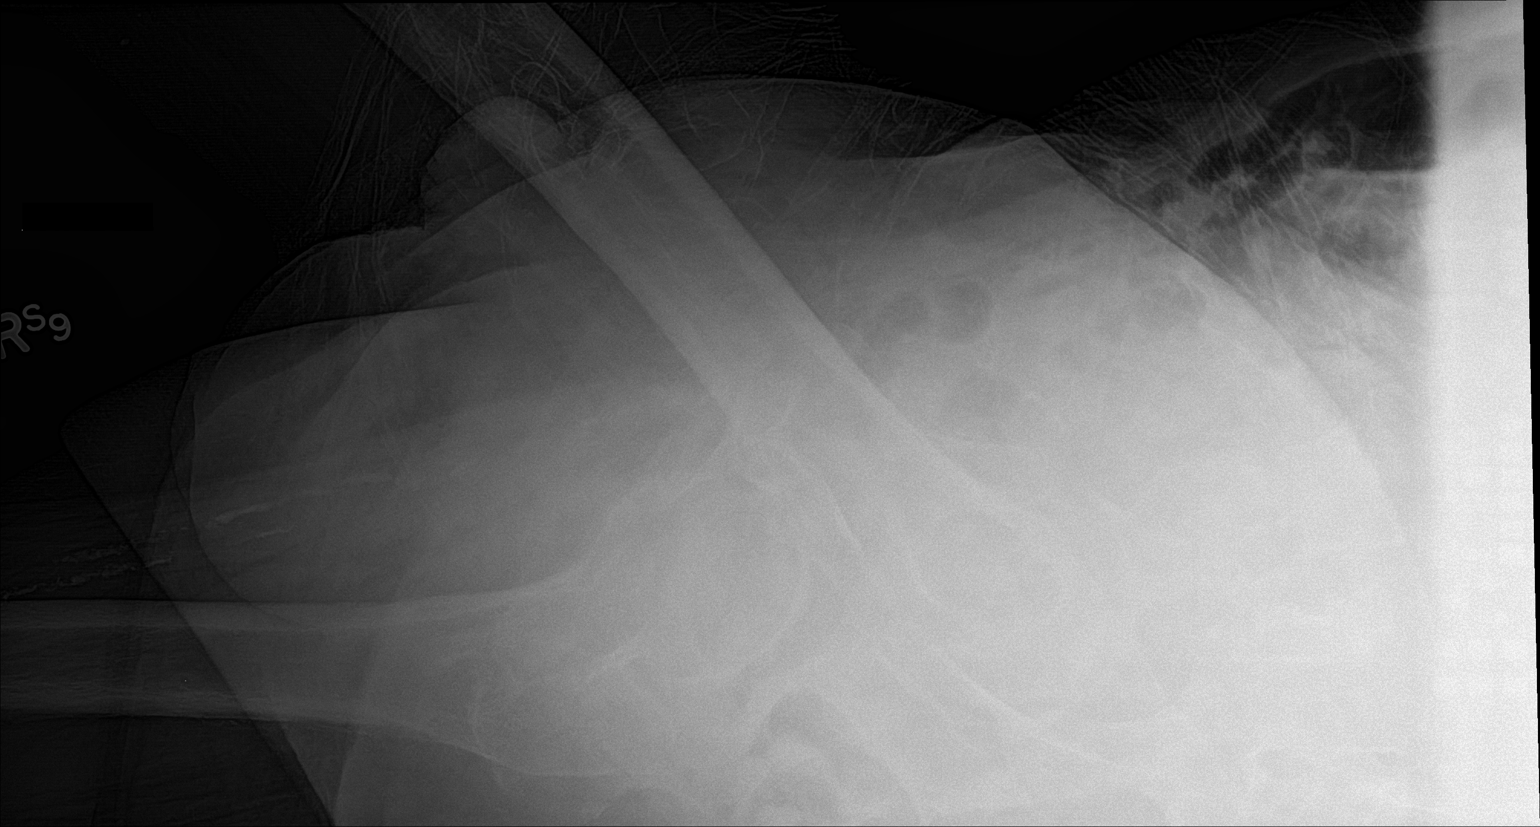

[3 of 3 positions shown; findings below may reference images not displayed]

FINDINGS: Diffuse osteopenia. Pelvic bones intact. Proximal femur shows no
fracture or dislocation.Extensive calcification of the iliac and
femoral arteries.
IMPRESSION: No acute findings.

## 2018-11-25 IMAGING — CT CT HEAD W/O CM
3 series · 14 of 47 positions shown, 16 images · non-contrast
Comparison: 04/25/2016

CLINICAL DATA: Right-sided weakness, right arm weakness

EXAM:
CT HEAD WITHOUT CONTRAST
TECHNIQUE: Contiguous axial images were obtained from the base of the skull
through the vertex without intravenous contrast.

[Series 2: head wo · axial · 0.47mm/px · z∈[-166,-41]mm · 8 of 30 slices shown, 10 images]
[im 3/30  brain]
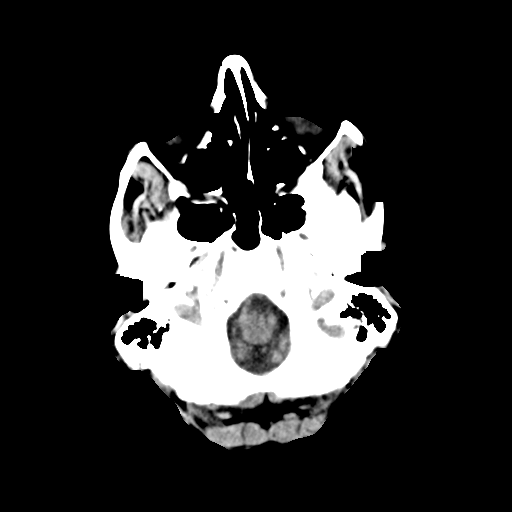
[im 3/30  bone]
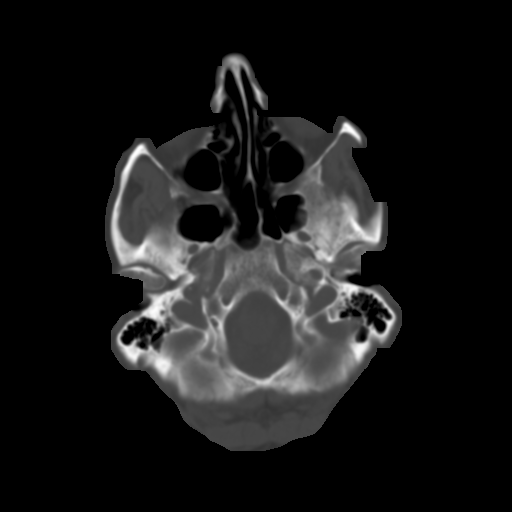
[im 7/30  brain]
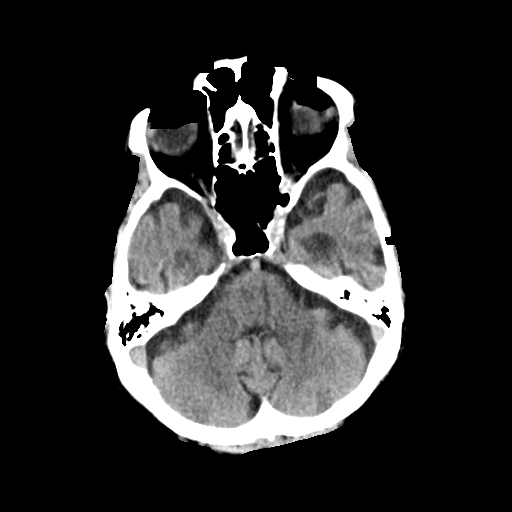
[im 10/30  brain]
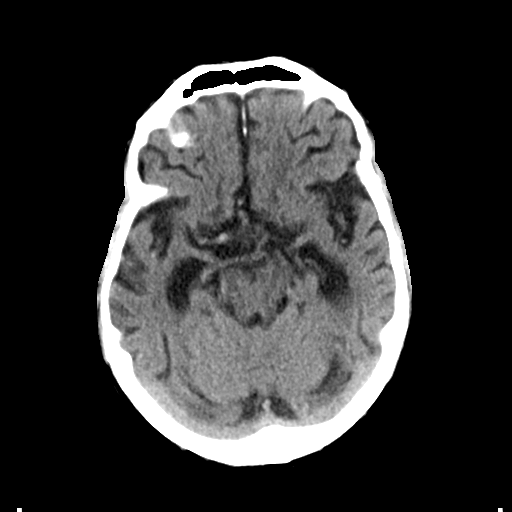
[im 14/30  brain]
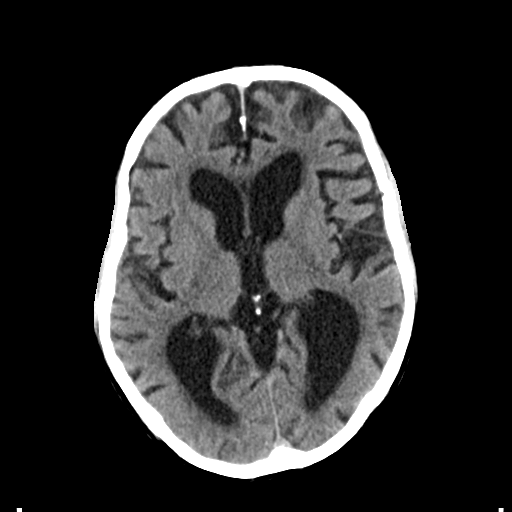
[im 17/30  brain]
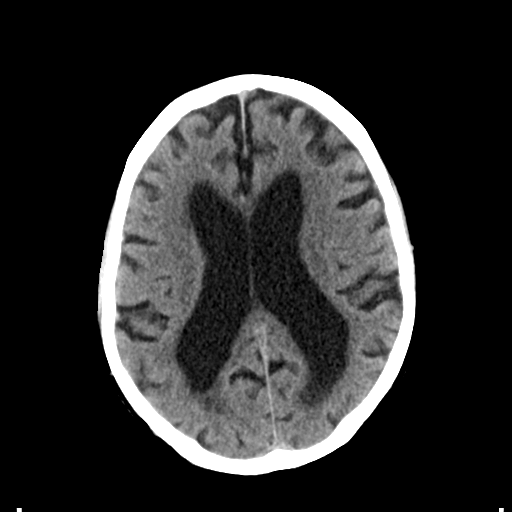
[im 17/30  bone]
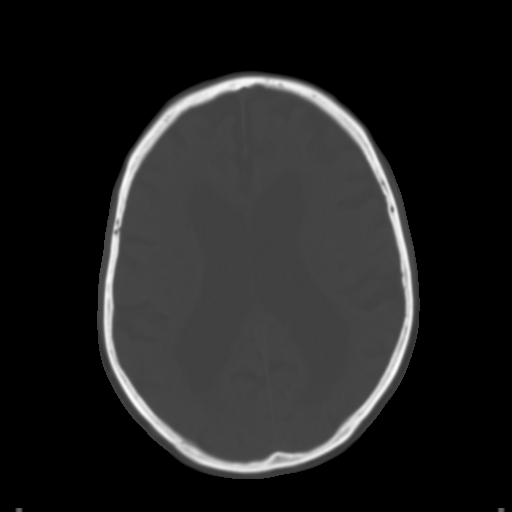
[im 21/30  brain]
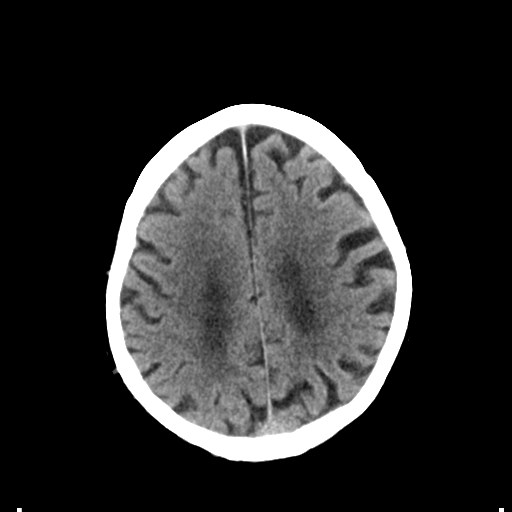
[im 24/30  brain]
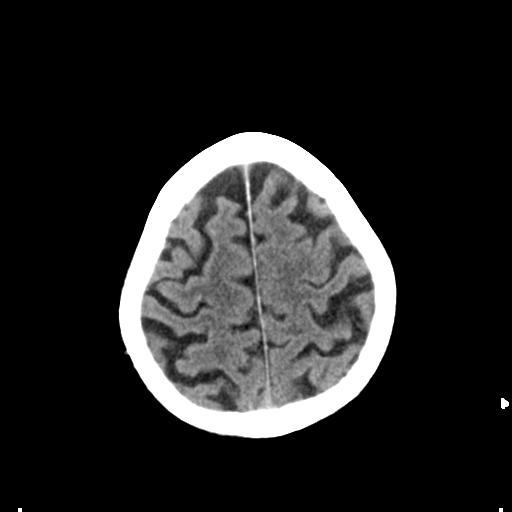
[im 28/30  brain]
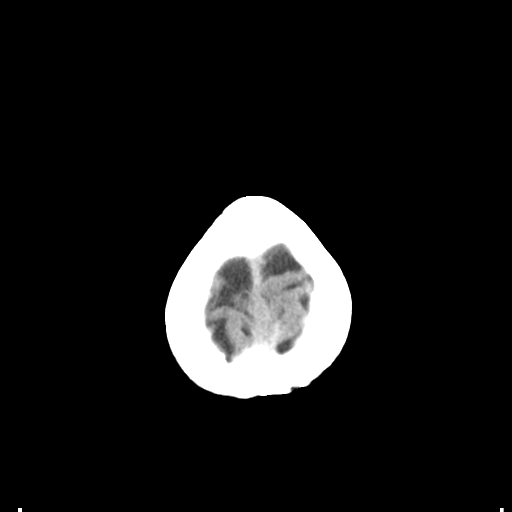

[Series 4: coronal soft tissue · coronal · 0.30mm/px · 3 of 63 slices shown]
[im 21/63  brain]
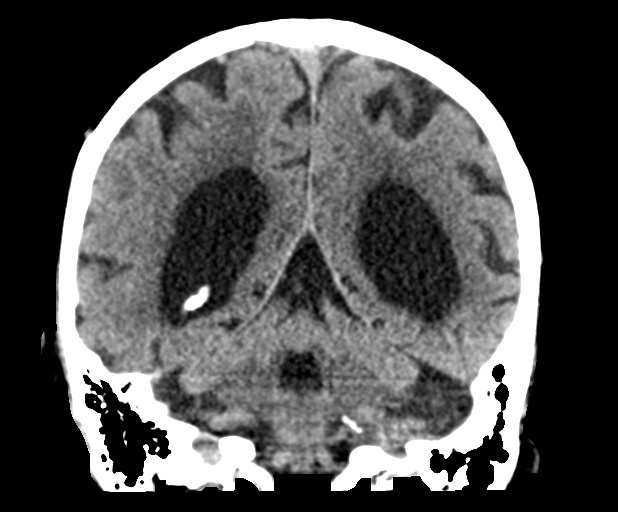
[im 28/63  brain]
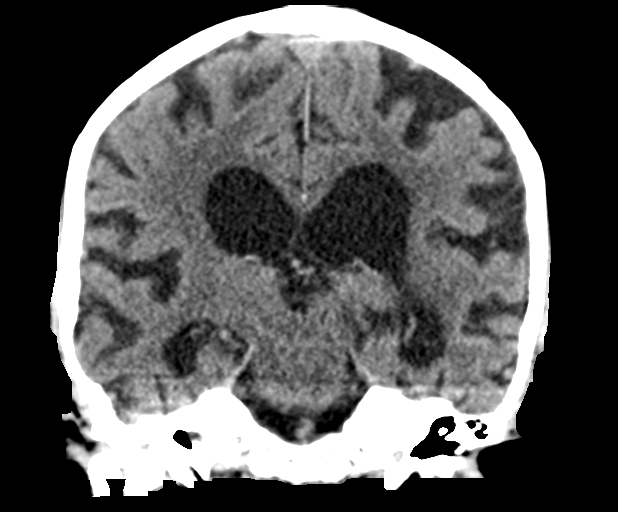
[im 35/63  brain]
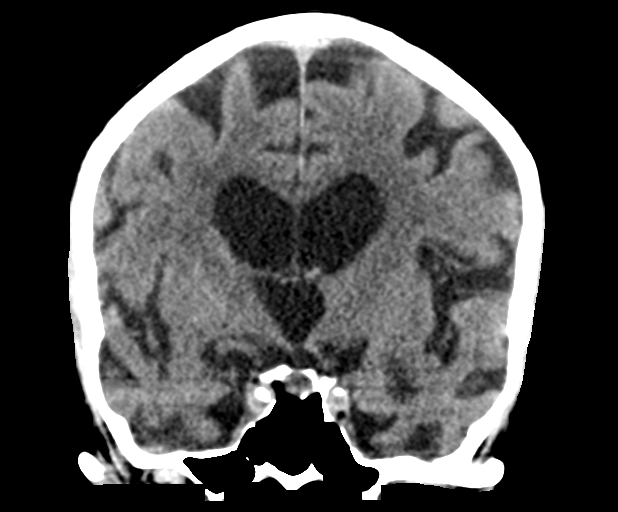

[Series 5: sagittal soft tissue · sagittal · 0.29mm/px · 3 of 52 slices shown]
[im 18/52  brain]
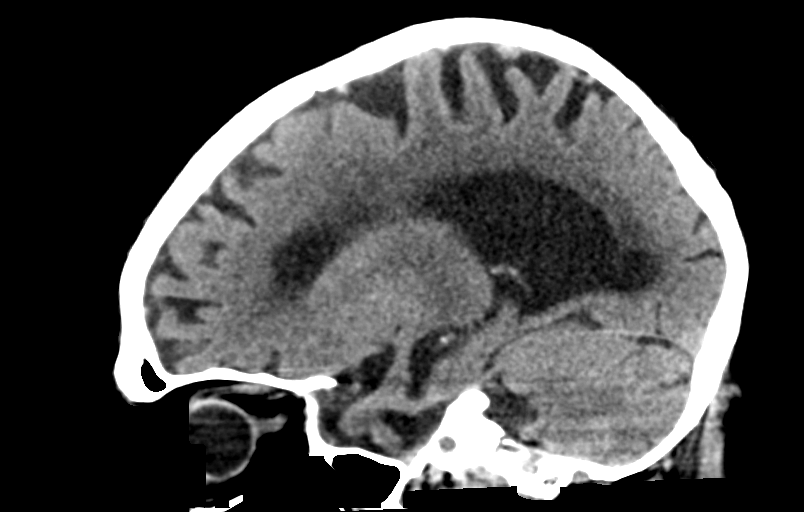
[im 26/52  brain]
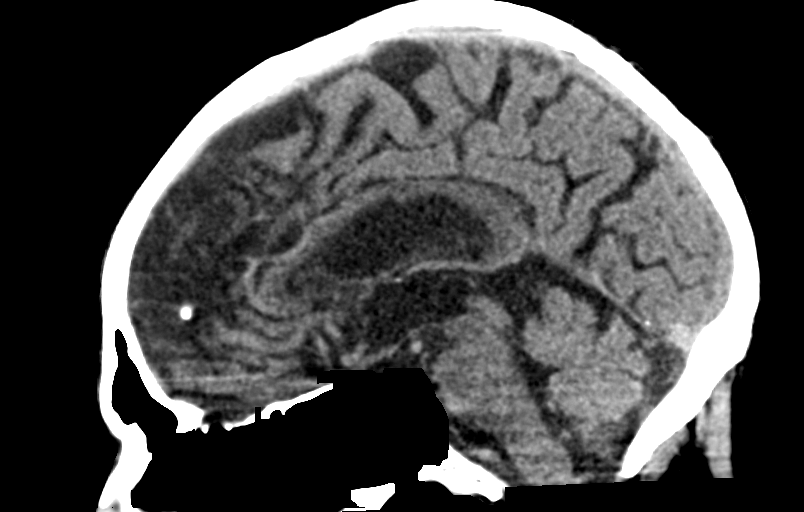
[im 35/52  brain]
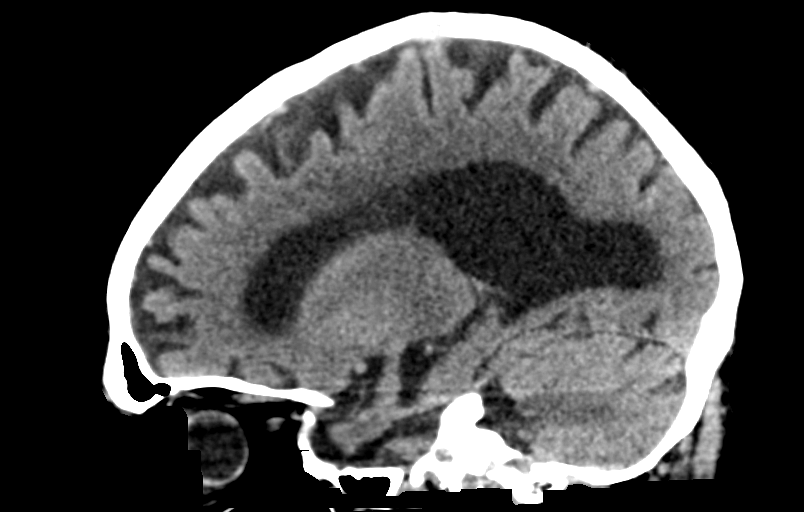

[14 of 47 positions shown; findings below may reference images not displayed]

FINDINGS: Brain: There is atrophy and chronic small vessel disease changes.
Associated ventriculomegaly, stable. No acute intracranial
abnormality. Specifically, no hemorrhage, hydrocephalus, mass
lesion, acute infarction, or significant intracranial injury.

Vascular: No hyperdense vessel or unexpected calcification.

Skull: No acute calvarial abnormality.

Sinuses/Orbits: Visualized paranasal sinuses and mastoids clear.
Orbital soft tissues unremarkable.

Other: None
IMPRESSION: No acute intracranial abnormality.

Atrophy, chronic microvascular disease.
